# Patient Record
Sex: Female | Born: 1990
Health system: Southern US, Community
[De-identification: ages and names within clinical notes are randomized; demographics above are authoritative.]

## PROBLEM LIST (undated history)

## (undated) DIAGNOSIS — I10 Essential (primary) hypertension: Secondary | ICD-10-CM

## (undated) DIAGNOSIS — R7303 Prediabetes: Secondary | ICD-10-CM

## (undated) DIAGNOSIS — F329 Major depressive disorder, single episode, unspecified: Secondary | ICD-10-CM

## (undated) DIAGNOSIS — K589 Irritable bowel syndrome without diarrhea: Secondary | ICD-10-CM

## (undated) DIAGNOSIS — T7840XA Allergy, unspecified, initial encounter: Secondary | ICD-10-CM

## (undated) DIAGNOSIS — G43909 Migraine, unspecified, not intractable, without status migrainosus: Secondary | ICD-10-CM

## (undated) DIAGNOSIS — F988 Other specified behavioral and emotional disorders with onset usually occurring in childhood and adolescence: Secondary | ICD-10-CM

## (undated) DIAGNOSIS — F32A Depression, unspecified: Secondary | ICD-10-CM

## (undated) DIAGNOSIS — R600 Localized edema: Secondary | ICD-10-CM

## (undated) DIAGNOSIS — J45909 Unspecified asthma, uncomplicated: Secondary | ICD-10-CM

## (undated) DIAGNOSIS — F419 Anxiety disorder, unspecified: Secondary | ICD-10-CM

## (undated) DIAGNOSIS — E739 Lactose intolerance, unspecified: Secondary | ICD-10-CM

## (undated) DIAGNOSIS — E785 Hyperlipidemia, unspecified: Secondary | ICD-10-CM

## (undated) DIAGNOSIS — R011 Cardiac murmur, unspecified: Secondary | ICD-10-CM

## (undated) DIAGNOSIS — K219 Gastro-esophageal reflux disease without esophagitis: Secondary | ICD-10-CM

## (undated) DIAGNOSIS — E282 Polycystic ovarian syndrome: Secondary | ICD-10-CM

## (undated) HISTORY — DX: Anxiety disorder, unspecified: F41.9

## (undated) HISTORY — PX: ESOPHAGOGASTRODUODENOSCOPY ENDOSCOPY: SHX5814

## (undated) HISTORY — DX: Lactose intolerance, unspecified: E73.9

## (undated) HISTORY — DX: Irritable bowel syndrome, unspecified: K58.9

## (undated) HISTORY — PX: WISDOM TOOTH EXTRACTION: SHX21

## (undated) HISTORY — DX: Localized edema: R60.0

## (undated) HISTORY — DX: Cardiac murmur, unspecified: R01.1

## (undated) HISTORY — DX: Unspecified asthma, uncomplicated: J45.909

## (undated) HISTORY — DX: Hyperlipidemia, unspecified: E78.5

## (undated) HISTORY — DX: Migraine, unspecified, not intractable, without status migrainosus: G43.909

## (undated) HISTORY — DX: Depression, unspecified: F32.A

## (undated) HISTORY — DX: Gastro-esophageal reflux disease without esophagitis: K21.9

## (undated) HISTORY — DX: Prediabetes: R73.03

## (undated) HISTORY — DX: Allergy, unspecified, initial encounter: T78.40XA

## (undated) HISTORY — DX: Major depressive disorder, single episode, unspecified: F32.9

---
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
## 2011-04-19 LAB — DIFFERENTIAL
Basophils Absolute: 0
Basophils Relative: 0
Eosinophils Absolute: 0.1
Eosinophils Relative: 1

## 2011-04-19 LAB — URINALYSIS, ROUTINE W REFLEX MICROSCOPIC
Glucose, UA: NEGATIVE
Ketones, ur: NEGATIVE
Nitrite: NEGATIVE
Protein, ur: 30 — AB
Specific Gravity, Urine: 1.034 — ABNORMAL HIGH
Urobilinogen, UA: 0.2
pH: 5.5

## 2011-04-19 LAB — LIPASE, BLOOD: Lipase: 26

## 2011-04-19 LAB — CBC
Hemoglobin: 13.4
MCV: 85.9
RBC: 4.61
WBC: 11.7 — ABNORMAL HIGH

## 2011-04-19 LAB — POCT PREGNANCY, URINE: Preg Test, Ur: NEGATIVE

## 2011-04-19 LAB — COMPREHENSIVE METABOLIC PANEL
ALT: 18
AST: 23
CO2: 29
Chloride: 102
GFR calc Af Amer: >60
GFR calc non Af Amer: >60
Sodium: 140
Total Bilirubin: 0.8

## 2011-04-19 LAB — URINE MICROSCOPIC-ADD ON

## 2014-02-08 ENCOUNTER — Emergency Department (HOSPITAL_COMMUNITY)
Admission: EM | Admit: 2014-02-08 | Discharge: 2014-02-08 | Disposition: A | Discharge status: Home / Self Care | Payer: 59 | Source: Home / Self Care | Attending: Emergency Medicine | Admitting: Emergency Medicine

## 2014-02-08 ENCOUNTER — Encounter (HOSPITAL_COMMUNITY): Payer: Self-pay | Admitting: Emergency Medicine

## 2014-02-08 DIAGNOSIS — I10 Essential (primary) hypertension: Secondary | ICD-10-CM

## 2014-02-08 DIAGNOSIS — R319 Hematuria, unspecified: Secondary | ICD-10-CM

## 2014-02-08 LAB — POCT URINALYSIS DIP (DEVICE)
BILIRUBIN URINE: NEGATIVE
GLUCOSE, UA: NEGATIVE mg/dL
Leukocytes, UA: NEGATIVE
NITRITE: NEGATIVE
PH: 6.5 (ref 5.0–8.0)
Protein, ur: NEGATIVE mg/dL
SPECIFIC GRAVITY, URINE: 1.02 (ref 1.005–1.030)
Urobilinogen, UA: 0.2 mg/dL (ref 0.0–1.0)

## 2014-02-08 LAB — POCT PREGNANCY, URINE: Preg Test, Ur: NEGATIVE

## 2014-02-08 MED ORDER — CEPHALEXIN 500 MG PO CAPS: 500.0000 mg | ORAL_CAPSULE | Freq: Three times a day (TID) | ORAL | Status: DC

## 2014-02-08 NOTE — ED Notes (Signed)
C/o uti for a month States she notice blood in her urine  No tx tried.

## 2014-02-08 NOTE — ED Provider Notes (Signed)
Chief Complaint   Chief Complaint  Patient presents with  . Urinary Tract Infection    History of Present Illness   Madison Robertson is a 23 year old female who's had a one-month history of blood in her urine. This has been painless, she denies dysuria, frequency, urgency, lower abdominal or lower back pain, fever, chills, nausea, or vomiting. She's had no prior history of urinary tract infections. No GYN problems except for polycystic ovarian syndrome.  Review of Systems   Other than as noted above, the patient denies any of the following symptoms: General:  No fevers or chills. GI:  No abdominal pain, back pain, nausea, or vomiting. GU:  No hematuria or incontinence. GYN:  No discharge, itching, vulvar pain or lesions, pelvic pain, or abnormal vaginal bleeding.  [place]   Past medical history, family history, social history, meds, and allergies were reviewed.    Physical Examination     Vital signs:  BP 154/105  Pulse 90  Temp(Src) 98.3 F (36.8 C) (Oral)  Resp 18  SpO2 99% Gen:  Alert, oriented, in no distress. Lungs:  Clear to auscultation, no wheezes, rales or rhonchi. Heart:  Regular rhythm, no gallop or murmer. Abdomen:  Flat and soft. There was slight suprapubic pain to palpation.  No guarding, or rebound.  No hepato-splenomegaly or mass.  Bowel sounds were normally active.  No hernia. Back:  No CVA tenderness.  Skin:  Clear, warm and dry.  Labs   Results for orders placed during the hospital encounter of 02/08/14  POCT URINALYSIS DIP (DEVICE)      Result Value Ref Range   Glucose, UA NEGATIVE  NEGATIVE mg/dL   Bilirubin Urine NEGATIVE  NEGATIVE   Ketones, ur TRACE (*) NEGATIVE mg/dL   Specific Gravity, Urine 1.020  1.005 - 1.030   Hgb urine dipstick TRACE (*) NEGATIVE   pH 6.5  5.0 - 8.0   Protein, ur NEGATIVE  NEGATIVE mg/dL   Urobilinogen, UA 0.2  0.0 - 1.0 mg/dL   Nitrite NEGATIVE  NEGATIVE   Leukocytes, UA NEGATIVE  NEGATIVE  POCT PREGNANCY, URINE    Result Value Ref Range   Preg Test, Ur NEGATIVE  NEGATIVE     A urine culture was obtained.  Results are pending at this time and we will call about any positive results.  Assessment   The primary encounter diagnosis was Hematuria. A diagnosis of Essential hypertension was also pertinent to this visit.   No evidence of pyelonephritis.  She has painless hematuria. Her dipstick today only shows trace hemoglobin. I presented to her and her mother that there are many other causes for blood in the urine. We will be doing a culture. We'll go ahead and treat with cephalexin. I suggested she followup with a primary care physician in 2 weeks for a repeat urinalysis. If there is still blood present she will need further evaluation possibly to include a CT scan and cystoscopy.  Plan   1.  Meds:  The following meds were prescribed:   Discharge Medication List as of 02/08/2014  6:54 PM    START taking these medications   Details  cephALEXin (KEFLEX) 500 MG capsule Take 1 capsule (500 mg total) by mouth 3 (three) times daily., Starting 02/08/2014, Until Discontinued, Normal        2.  Patient Education/Counseling:  The patient was given appropriate handouts, self care instructions, and instructed in symptomatic relief. The patient was told to avoid intercourse for 10 days, get extra fluids,  and return for a follow up with her primary care doctor at the completion of treatment for a repeat UA and culture.    3.  Follow up:  The patient was told to follow up here if no better in 3 to 4 days, or sooner if becoming worse in any way, and given some red flag symptoms such as fever, persistent vomiting, or severe flank or abdominal pain which would prompt immediate return.     Harden Mo, MD 02/08/14 [phone]

## 2014-02-08 NOTE — Discharge Instructions (Signed)
Hematuria, Adult  Hematuria is blood in your urine. It can be caused by a bladder infection, kidney infection, prostate infection, kidney stone, or cancer of your urinary tract. Infections can usually be treated with medicine, and a kidney stone usually will pass through your urine. If neither of these is the cause of your hematuria, further workup to find out the reason may be needed.  It is very important that you tell your health care provider about any blood you see in your urine, even if the blood stops without treatment or happens without causing pain. Blood in your urine that happens and then stops and then happens again can be a symptom of a very serious condition. Also, pain is not a symptom in the initial stages of many urinary cancers.  HOME CARE INSTRUCTIONS   · Drink lots of fluid, 3-4 quarts a day. If you have been diagnosed with an infection, cranberry juice is especially recommended, in addition to large amounts of water.  · Avoid caffeine, tea, and carbonated beverages, because they tend to irritate the bladder.  · Avoid alcohol because it may irritate the prostate.  · Only take over-the-counter or prescription medicines for pain, discomfort, or fever as directed by your health care provider.  · If you have been diagnosed with a kidney stone, follow your health care provider's instructions regarding straining your urine to catch the stone.  · Empty your bladder often. Avoid holding urine for long periods of time.  · After a bowel movement, women should cleanse front to back. Use each tissue only once.  · Empty your bladder before and after sexual intercourse if you are a female.  SEEK MEDICAL CARE IF:  You develop back pain, fever, a feeling of sickness in your stomach (nausea), or vomiting or if your symptoms are not better in 3 days. Return sooner if you are getting worse.  SEEK IMMEDIATE MEDICAL CARE IF:   · You have a persistent fever, with a temperature of 101.8°F (38.8°C) or greater.  · You  develop severe vomiting and are unable to keep the medicine down.  · You develop severe back or abdominal pain despite taking your medicines.  · You begin passing a large amount of blood or clots in your urine.  · You feel extremely weak or faint, or you pass out.  MAKE SURE YOU:   · Understand these instructions.  · Will watch your condition.  · Will get help right away if you are not doing well or get worse.  Document Released: 07/15/2005 Document Revised: 05/05/2013 Document Reviewed: 03/15/2013  Exit[employer]® Patient Information ©2015 [employer]. This information is not intended to replace advice given to you by your health care provider. Make sure you discuss any questions you have with your health care provider.

## 2014-05-19 ENCOUNTER — Ambulatory Visit (INDEPENDENT_AMBULATORY_CARE_PROVIDER_SITE_OTHER): Payer: 59 | Admitting: Emergency Medicine

## 2014-05-19 VITALS — BP 124/84 | HR 138 | Temp 99.7°F | Resp 16 | Ht 67.0 in | Wt 257.2 lb

## 2014-05-19 DIAGNOSIS — J020 Streptococcal pharyngitis: Secondary | ICD-10-CM

## 2014-05-19 MED ORDER — PENICILLIN V POTASSIUM 500 MG PO TABS: 500.0000 mg | ORAL_TABLET | Freq: Four times a day (QID) | ORAL | Status: DC

## 2014-05-19 NOTE — Progress Notes (Signed)
[facility] [address] [place] [address] [phone]  Date:  05/19/2014   Name:  Madison Robertson   DOB:  06/01/1991   MRN:  [id number]  PCP:  No PCP Per Patient    Chief Complaint: Sore Throat and Fever   History of Present Illness:  Madison Robertson is a 23 y.o. very pleasant female patient who presents with the following:  Sore throat this morning at 0430 when awakened with pain.   101.4 fever this afternoon.  Some chills.   No cough, wheezing or shortness of breath.  Clear rhinorrhea.  No PND No rash. No nausea or vomiting No improvement with over the counter medications or other home remedies.  Denies other complaint or health concern today.   There are no active problems to display for this patient.   No past medical history on file.  No past surgical history on file.  History  Substance Use Topics  . Smoking status: Never Smoker   . Smokeless tobacco: Not on file  . Alcohol Use: Not on file    No family history on file.  No Known Allergies  Medication list has been reviewed and updated.  No current outpatient prescriptions on file prior to visit.   No current facility-administered medications on file prior to visit.    Review of Systems:  As per HPI, otherwise negative.    Physical Examination: Filed Vitals:   05/19/14 1655  BP: 124/84  Pulse: 138  Temp: 99.7 F (37.6 C)  Resp: 16   Filed Vitals:   05/19/14 1655  Height: 5\' 7"  (1.702 m)  Weight: 257 lb 3.2 oz (116.665 kg)   Body mass index is 40.27 kg/(m^2). Ideal Body Weight: Weight in (lb) to have BMI = 25: 159.3  GEN: WDWN, NAD, Non-toxic, A & O x 3 HEENT: Atraumatic, Normocephalic. Neck supple. No masses, No LAD.  Exudative pharyngitis Ears and Nose: No external deformity. CV: RRR, No M/G/R. No JVD. No thrill. No extra heart sounds. PULM: CTA B, no wheezes, crackles, rhonchi. No retractions. No resp. distress. No accessory muscle use. ABD: S, NT, ND,  +BS. No rebound. No HSM. EXTR: No c/c/e NEURO Normal gait.  PSYCH: Normally interactive. Conversant. Not depressed or anxious appearing.  Calm demeanor.    Assessment and Plan: Strep throat Pen v k Tylenol #3  Signed,  Ellison Carwin, MD

## 2014-05-19 NOTE — Patient Instructions (Signed)

## 2014-05-20 ENCOUNTER — Other Ambulatory Visit (HOSPITAL_COMMUNITY)
Admission: RE | Admit: 2014-05-20 | Discharge: 2014-05-20 | Disposition: A | Discharge status: Home / Self Care | Payer: 59 | Source: Ambulatory Visit | Attending: Obstetrics and Gynecology | Admitting: Obstetrics and Gynecology

## 2014-05-20 ENCOUNTER — Other Ambulatory Visit: Payer: Self-pay | Admitting: Obstetrics and Gynecology

## 2014-05-20 DIAGNOSIS — Z01411 Encounter for gynecological examination (general) (routine) with abnormal findings: Secondary | ICD-10-CM | POA: Diagnosis not present

## 2014-05-24 LAB — CYTOLOGY - PAP

## 2014-12-27 ENCOUNTER — Other Ambulatory Visit: Payer: Self-pay | Admitting: Family Medicine

## 2014-12-27 DIAGNOSIS — E010 Iodine-deficiency related diffuse (endemic) goiter: Secondary | ICD-10-CM

## 2014-12-29 ENCOUNTER — Ambulatory Visit
Admission: RE | Admit: 2014-12-29 | Discharge: 2014-12-29 | Disposition: A | Discharge status: Home / Self Care | Payer: 59 | Source: Ambulatory Visit | Attending: Family Medicine | Admitting: Family Medicine

## 2014-12-29 DIAGNOSIS — E010 Iodine-deficiency related diffuse (endemic) goiter: Secondary | ICD-10-CM

## 2015-08-01 MED FILL — METHYLPHENIDATE ER 27 MG TA: 27 | 30 days supply | Qty: 30 | Fill #0

## 2015-08-18 DIAGNOSIS — I10 Essential (primary) hypertension: Secondary | ICD-10-CM | POA: Diagnosis not present

## 2015-08-18 DIAGNOSIS — E282 Polycystic ovarian syndrome: Secondary | ICD-10-CM | POA: Diagnosis not present

## 2015-08-18 DIAGNOSIS — F900 Attention-deficit hyperactivity disorder, predominantly inattentive type: Secondary | ICD-10-CM | POA: Diagnosis not present

## 2015-08-18 MED FILL — MONO-LINYAH 28 TABLET: 0.25-35 | 28 days supply | Qty: 28 | Fill #0

## 2015-08-18 MED FILL — TRIAMCINOLONE 0.1% CREAM: 0.1 | 30 days supply | Qty: 80 | Fill #2

## 2015-08-18 MED FILL — METHYLPHENIDATE 10 MG TAB: 10 | 30 days supply | Qty: 30 | Fill #0

## 2015-09-05 MED FILL — METHYLPHENIDATE ER 27 MG TA: 27 | 30 days supply | Qty: 30 | Fill #0

## 2015-09-06 DIAGNOSIS — L680 Hirsutism: Secondary | ICD-10-CM | POA: Diagnosis not present

## 2015-09-06 DIAGNOSIS — E282 Polycystic ovarian syndrome: Secondary | ICD-10-CM | POA: Diagnosis not present

## 2015-09-13 MED FILL — MONO-LINYAH 28 TABLET: 0.25-35 | 84 days supply | Qty: 84 | Fill #0

## 2015-09-19 DIAGNOSIS — I10 Essential (primary) hypertension: Secondary | ICD-10-CM | POA: Diagnosis not present

## 2015-09-19 DIAGNOSIS — E282 Polycystic ovarian syndrome: Secondary | ICD-10-CM | POA: Diagnosis not present

## 2015-09-19 DIAGNOSIS — F900 Attention-deficit hyperactivity disorder, predominantly inattentive type: Secondary | ICD-10-CM | POA: Diagnosis not present

## 2015-10-02 MED FILL — HYDROCHLOROTHIAZIDE 25 MG T: 25 | 90 days supply | Qty: 90 | Fill #3

## 2015-10-09 MED FILL — METHYLPHENIDATE 10 MG TAB: 10 | 30 days supply | Qty: 30 | Fill #0

## 2015-10-09 MED FILL — METHYLPHENIDATE ER 27 MG TA: 27 | 30 days supply | Qty: 30 | Fill #0

## 2015-11-07 MED FILL — METHYLPHENIDATE 10 MG TAB: 10 | 30 days supply | Qty: 30 | Fill #0

## 2015-11-07 MED FILL — METHYLPHENIDATE ER 27 MG TA: 27 | 30 days supply | Qty: 30 | Fill #0

## 2015-12-13 MED FILL — METHYLPHENIDATE ER 27 MG TA: 27 | 30 days supply | Qty: 30 | Fill #0

## 2015-12-18 MED FILL — MONO-LINYAH 28 TABLET: 0.25-35 | 84 days supply | Qty: 84 | Fill #1

## 2015-12-18 MED FILL — TRIAMCINOLONE 0.1% CREAM: 0.1 | 30 days supply | Qty: 80 | Fill #3

## 2016-01-05 MED FILL — HYDROCHLOROTHIAZIDE 25 MG T: 25 | 30 days supply | Qty: 30 | Fill #0

## 2016-01-24 DIAGNOSIS — F900 Attention-deficit hyperactivity disorder, predominantly inattentive type: Secondary | ICD-10-CM | POA: Diagnosis not present

## 2016-01-24 DIAGNOSIS — R739 Hyperglycemia, unspecified: Secondary | ICD-10-CM | POA: Diagnosis not present

## 2016-01-24 DIAGNOSIS — R5383 Other fatigue: Secondary | ICD-10-CM | POA: Diagnosis not present

## 2016-01-24 DIAGNOSIS — I10 Essential (primary) hypertension: Secondary | ICD-10-CM | POA: Diagnosis not present

## 2016-01-24 DIAGNOSIS — F321 Major depressive disorder, single episode, moderate: Secondary | ICD-10-CM | POA: Diagnosis not present

## 2016-01-24 DIAGNOSIS — L309 Dermatitis, unspecified: Secondary | ICD-10-CM | POA: Diagnosis not present

## 2016-01-24 MED FILL — TRIAMCINOLONE 0.1% CREAM: 0.1 | 60 days supply | Qty: 60 | Fill #0

## 2016-01-24 MED FILL — METHYLPHENIDATE 10 MG TAB: 10 | 30 days supply | Qty: 30 | Fill #0

## 2016-01-24 MED FILL — METHYLPHENIDATE ER 27 MG TA: 27 | 30 days supply | Qty: 30 | Fill #0

## 2016-01-24 MED FILL — LISINOPRIL-HCTZ 20-25 MG TA: 20-25 | 90 days supply | Qty: 90 | Fill #0

## 2016-01-24 MED FILL — SERTRALINE HCL 50 MG TABLET: 50 | 30 days supply | Qty: 30 | Fill #0

## 2016-02-01 DIAGNOSIS — R7301 Impaired fasting glucose: Secondary | ICD-10-CM | POA: Diagnosis not present

## 2016-02-01 DIAGNOSIS — R5383 Other fatigue: Secondary | ICD-10-CM | POA: Diagnosis not present

## 2016-02-01 DIAGNOSIS — I10 Essential (primary) hypertension: Secondary | ICD-10-CM | POA: Diagnosis not present

## 2016-02-05 ENCOUNTER — Encounter (HOSPITAL_COMMUNITY): Payer: Self-pay | Admitting: Emergency Medicine

## 2016-02-05 ENCOUNTER — Emergency Department (HOSPITAL_COMMUNITY)
Admission: EM | Admit: 2016-02-05 | Discharge: 2016-02-05 | Disposition: A | Discharge status: Home / Self Care | Payer: 59 | Attending: Emergency Medicine | Admitting: Emergency Medicine

## 2016-02-05 ENCOUNTER — Emergency Department (HOSPITAL_COMMUNITY): Payer: 59

## 2016-02-05 DIAGNOSIS — N390 Urinary tract infection, site not specified: Secondary | ICD-10-CM | POA: Diagnosis not present

## 2016-02-05 DIAGNOSIS — I10 Essential (primary) hypertension: Secondary | ICD-10-CM | POA: Insufficient documentation

## 2016-02-05 DIAGNOSIS — R1011 Right upper quadrant pain: Secondary | ICD-10-CM | POA: Diagnosis not present

## 2016-02-05 DIAGNOSIS — Z79899 Other long term (current) drug therapy: Secondary | ICD-10-CM | POA: Diagnosis not present

## 2016-02-05 HISTORY — DX: Other specified behavioral and emotional disorders with onset usually occurring in childhood and adolescence: F98.8

## 2016-02-05 HISTORY — DX: Essential (primary) hypertension: I10

## 2016-02-05 LAB — COMPREHENSIVE METABOLIC PANEL
ALT: 12 U/L — AB (ref 14–54)
ANION GAP: 8 (ref 5–15)
AST: 15 U/L (ref 15–41)
Albumin: 4.3 g/dL (ref 3.5–5.0)
Alkaline Phosphatase: 61 U/L (ref 38–126)
BUN: 9 mg/dL (ref 6–20)
CHLORIDE: 103 mmol/L (ref 101–111)
CO2: 27 mmol/L (ref 22–32)
Calcium: 9.2 mg/dL (ref 8.9–10.3)
Creatinine, Ser: 0.8 mg/dL (ref 0.44–1.00)
Glucose, Bld: 90 mg/dL (ref 65–99)
POTASSIUM: 3.2 mmol/L — AB (ref 3.5–5.1)
SODIUM: 138 mmol/L (ref 135–145)
Total Bilirubin: 0.5 mg/dL (ref 0.3–1.2)
Total Protein: 8.4 g/dL — ABNORMAL HIGH (ref 6.5–8.1)

## 2016-02-05 LAB — URINE MICROSCOPIC-ADD ON

## 2016-02-05 LAB — URINALYSIS, ROUTINE W REFLEX MICROSCOPIC
Bilirubin Urine: NEGATIVE
GLUCOSE, UA: NEGATIVE mg/dL
Ketones, ur: 15 mg/dL — AB
Nitrite: NEGATIVE
PROTEIN: NEGATIVE mg/dL
SPECIFIC GRAVITY, URINE: 1.023 (ref 1.005–1.030)
pH: 6.5 (ref 5.0–8.0)

## 2016-02-05 LAB — CBC
HEMATOCRIT: 42.5 % (ref 36.0–46.0)
HEMOGLOBIN: 14.1 g/dL (ref 12.0–15.0)
MCH: 27.9 pg (ref 26.0–34.0)
MCHC: 33.2 g/dL (ref 30.0–36.0)
MCV: 84 fL (ref 78.0–100.0)
Platelets: 252 10*3/uL (ref 150–400)
RBC: 5.06 MIL/uL (ref 3.87–5.11)
RDW: 14.7 % (ref 11.5–15.5)
WBC: 15.2 10*3/uL — AB (ref 4.0–10.5)

## 2016-02-05 LAB — POC URINE PREG, ED: Preg Test, Ur: NEGATIVE

## 2016-02-05 LAB — LIPASE, BLOOD: LIPASE: 32 U/L (ref 11–51)

## 2016-02-05 MED ORDER — CIPROFLOXACIN HCL 500 MG PO TABS: 500.0000 mg | ORAL_TABLET | Freq: Two times a day (BID) | ORAL | Status: DC

## 2016-02-05 MED ORDER — CIPROFLOXACIN HCL 500 MG PO TABS
500.0000 mg | ORAL_TABLET | Freq: Once | ORAL | Status: AC
  Administered 2016-02-05: 500 mg via ORAL
  Filled 2016-02-05: qty 1

## 2016-02-05 MED ORDER — OXYCODONE-ACETAMINOPHEN 5-325 MG PO TABS: 1.0000 | ORAL_TABLET | Freq: Four times a day (QID) | ORAL | Status: DC | PRN

## 2016-02-05 MED ORDER — ONDANSETRON HCL 4 MG PO TABS: 4.0000 mg | ORAL_TABLET | Freq: Four times a day (QID) | ORAL | Status: DC

## 2016-02-05 MED ORDER — OXYCODONE-ACETAMINOPHEN 5-325 MG PO TABS
1.0000 | ORAL_TABLET | Freq: Once | ORAL | Status: AC
  Administered 2016-02-05: 1 via ORAL
  Filled 2016-02-05: qty 1

## 2016-02-05 NOTE — ED Notes (Signed)
Ultrasound at bedside

## 2016-02-05 NOTE — Discharge Instructions (Signed)

## 2016-02-05 NOTE — ED Notes (Signed)
Pt reports intermittent RUQ abd pain for the past 1.5 weeks. Has been occuring more often in the past 24 hours. Accompanied by nausea. Urine has appeared darker than usual today. No diarrhea or vaginal discharge.

## 2016-02-05 NOTE — ED Provider Notes (Signed)
CSN: [id number]     Arrival date & time 02/05/16  1816 History  By signing my name below, I, Evelene Croon, attest that this documentation has been prepared under the direction and in the presence of non-physician practitioner, Linus Mako, PA-C. Electronically Signed: Evelene Croon, Scribe. 02/05/2016. 11:14 PM.    Chief Complaint  Patient presents with  . Abdominal Pain    The history is provided by the patient. No language interpreter was used.     HPI Comments:  Madison Robertson is a 25 y.o. female who presents to the Emergency Department complaining of intermittent, RUQ abdominal pain x 1.5 weeks. Pt rates her pain a 6/10 at this time. She reports associated nausea. She denies fever, dysuria, urinary frequency/urgency, and abnormal vaginal discharge or bleeding. Pt states she noticed a tinge of blood when she wiped today. She also notes her urine seemed darker today. No alleviating factors noted.   Past Medical History  Diagnosis Date  . Hypertension   . ADD (attention deficit disorder)    History reviewed. No pertinent past surgical history. History reviewed. No pertinent family history. Social History  Substance Use Topics  . Smoking status: Never Smoker   . Smokeless tobacco: None  . Alcohol Use: None   OB History    No data available     Review of Systems  Constitutional: Negative for fever.  Gastrointestinal: Positive for nausea and abdominal pain. Negative for vomiting.  Genitourinary: Negative for dysuria, urgency, frequency and vaginal discharge.  All other systems reviewed and are negative.  Allergies  Hydrocodone and Zoloft  Home Medications   Prior to Admission medications   Medication Sig Start Date End Date Taking? Authorizing Provider  ibuprofen (ADVIL,MOTRIN) 200 MG tablet Take 600 mg by mouth every 6 (six) hours as needed for fever, headache, mild pain, moderate pain or cramping.   Yes Historical Provider, MD  lisinopril-hydrochlorothiazide  (PRINZIDE,ZESTORETIC) 20-25 MG tablet Take 1 tablet by mouth daily.   Yes Historical Provider, MD  methylphenidate (RITALIN) 10 MG tablet Take 10 mg by mouth daily.   Yes Historical Provider, MD  methylphenidate 27 MG PO CR tablet Take 27 mg by mouth daily with breakfast.   Yes Historical Provider, MD  norgestimate-ethinyl estradiol ([place] 28) 0.25-35 MG-MCG tablet Take 1 tablet by mouth daily.   Yes Historical Provider, MD  ciprofloxacin (CIPRO) 500 MG tablet Take 1 tablet (500 mg total) by mouth 2 (two) times daily. 02/05/16   Domnic Vantol Carlota Raspberry, PA-C  ondansetron (ZOFRAN) 4 MG tablet Take 1 tablet (4 mg total) by mouth every 6 (six) hours. 02/05/16   Delos Haring, PA-C  oxyCODONE-acetaminophen (PERCOCET/ROXICET) 5-325 MG tablet Take 1 tablet by mouth every 6 (six) hours as needed for severe pain. 02/05/16   Gladyse Corvin Carlota Raspberry, PA-C   BP 114/75 mmHg  Pulse 94  Temp(Src) 98.4 F (36.9 C) (Oral)  Resp 20  SpO2 98% Physical Exam  Constitutional: She is oriented to person, place, and time. She appears well-developed and well-nourished. No distress.  HENT:  Head: Normocephalic and atraumatic.  Eyes: Conjunctivae are normal.  Cardiovascular: Normal rate.   Pulmonary/Chest: Effort normal.  Abdominal: Bowel sounds are normal. She exhibits no distension. There is no tenderness. There is no rigidity, no rebound, no guarding and no CVA tenderness.  Neurological: She is alert and oriented to person, place, and time.  Skin: Skin is warm and dry.  Psychiatric: She has a normal mood and affect.  Nursing note and vitals reviewed.  ED Course  Procedures  DIAGNOSTIC STUDIES:  Oxygen Saturation is 97% on RA, normal by my interpretation.    COORDINATION OF CARE:  11:09 PM Pt updated with results. Will administer antibiotic and percocet in the ED and discharge with Rx for both.   Discussed treatment plan with pt at bedside and pt agreed to plan.  Labs Review Labs Reviewed  COMPREHENSIVE METABOLIC  PANEL - Abnormal; Notable for the following:    Potassium 3.2 (*)    Total Protein 8.4 (*)    ALT 12 (*)    All other components within normal limits  CBC - Abnormal; Notable for the following:    WBC 15.2 (*)    All other components within normal limits  URINALYSIS, ROUTINE W REFLEX MICROSCOPIC (NOT AT [facility]) - Abnormal; Notable for the following:    APPearance CLOUDY (*)    Hgb urine dipstick LARGE (*)    Ketones, ur 15 (*)    Leukocytes, UA MODERATE (*)    All other components within normal limits  URINE MICROSCOPIC-ADD ON - Abnormal; Notable for the following:    Squamous Epithelial / LPF 6-30 (*)    Bacteria, UA FEW (*)    All other components within normal limits  URINE CULTURE  LIPASE, BLOOD  POC URINE PREG, ED    Imaging Review US Abdomen Limited  02/05/2016  CLINICAL DATA:  Intermittent right upper quadrant pain for 1.5 weeks. Nausea and vomiting. EXAM: US ABDOMEN LIMITED - RIGHT UPPER QUADRANT COMPARISON:  CT abdomen and pelvis 08/15/2007 FINDINGS: Gallbladder: No gallstones or wall thickening visualized. No sonographic Murphy sign noted by sonographer. Common bile duct: Diameter: 2.5 mm, normal Liver: No focal lesion identified. Within normal limits in parenchymal echogenicity. IMPRESSION: No evidence of cholelithiasis or cholecystitis. Electronically Signed   By: Lucienne Capers M.D.   On: 02/05/2016 22:29   I have personally reviewed and evaluated these images and lab results as part of my medical decision-making.   EKG Interpretation None      MDM   Final diagnoses:  UTI (lower urinary tract infection)   The patients US of the RUQ is unremarkable which is reassuring. Her CBC and BMP do not show any acute findings that require intervention. Her Urinalysis shows moderate leukocytes. Discussed possibly doing a pelvic with patient and she declines, she says she is not having any issues in the vaginal area.    Pt diagnosed with a UTI. Pt is afebrile, tachycardia,  hypotension, or other signs of serious infection.  Pt to be dc home with antibiotics and instructions to follow up with PCP if symptoms persist. Discussed return precautions. Pt appears safe for discharge.  Rx: Ciprofloxacin, pain and nausea medication. Follow-up with PCP in the next 1-3 days.  I personally performed the services described in this documentation, which was scribed in my presence. The recorded information has been reviewed and is accurate.   Delos Haring, PA-C 02/05/16 [place], MD 02/07/16 [phone]

## 2016-02-06 MED FILL — ONDANSETRON HCL 4 MG TABLET: 4 | 3 days supply | Qty: 12 | Fill #0

## 2016-02-06 MED FILL — CIPROFLOXACIN HCL 500 MG TA: 500 | 14 days supply | Qty: 28 | Fill #0

## 2016-02-06 MED FILL — OXYCODONE/APAP 5-325: 5-325 | 3 days supply | Qty: 10 | Fill #0

## 2016-02-07 LAB — URINE CULTURE: SPECIAL REQUESTS: NORMAL

## 2016-02-21 DIAGNOSIS — R1032 Left lower quadrant pain: Secondary | ICD-10-CM | POA: Diagnosis not present

## 2016-02-21 DIAGNOSIS — N3000 Acute cystitis without hematuria: Secondary | ICD-10-CM | POA: Diagnosis not present

## 2016-02-21 DIAGNOSIS — F321 Major depressive disorder, single episode, moderate: Secondary | ICD-10-CM | POA: Diagnosis not present

## 2016-02-21 DIAGNOSIS — I10 Essential (primary) hypertension: Secondary | ICD-10-CM | POA: Diagnosis not present

## 2016-02-23 ENCOUNTER — Other Ambulatory Visit: Payer: Self-pay | Admitting: Family Medicine

## 2016-02-23 DIAGNOSIS — R109 Unspecified abdominal pain: Secondary | ICD-10-CM

## 2016-02-27 MED FILL — METHYLPHENIDATE ER 27 MG TA: 27 | 30 days supply | Qty: 30 | Fill #0

## 2016-02-27 MED FILL — ONDANSETRON HCL 4 MG TABLET: 4 | 30 days supply | Qty: 20 | Fill #0

## 2016-02-27 MED FILL — METHYLPHENIDATE 10 MG TAB: 10 | 30 days supply | Qty: 30 | Fill #0

## 2016-02-29 ENCOUNTER — Ambulatory Visit
Admission: RE | Admit: 2016-02-29 | Discharge: 2016-02-29 | Disposition: A | Discharge status: Home / Self Care | Payer: 59 | Source: Ambulatory Visit | Attending: Family Medicine | Admitting: Family Medicine

## 2016-02-29 DIAGNOSIS — R109 Unspecified abdominal pain: Secondary | ICD-10-CM

## 2016-02-29 DIAGNOSIS — R1032 Left lower quadrant pain: Secondary | ICD-10-CM | POA: Diagnosis not present

## 2016-03-13 MED FILL — MONO-LINYAH 28 TABLET: 0.25-35 | 84 days supply | Qty: 84 | Fill #2

## 2016-04-02 MED FILL — SERTRALINE HCL 50 MG TABLET: 50 | 30 days supply | Qty: 30 | Fill #1

## 2016-04-03 MED FILL — METHYLPHENIDATE ER 27 MG TA: 27 | 30 days supply | Qty: 30 | Fill #0

## 2016-04-26 MED FILL — LISINOPRIL-HCTZ 20-25 MG TA: 20-25 | 90 days supply | Qty: 90 | Fill #1

## 2016-05-01 DIAGNOSIS — F900 Attention-deficit hyperactivity disorder, predominantly inattentive type: Secondary | ICD-10-CM | POA: Diagnosis not present

## 2016-05-01 DIAGNOSIS — N83202 Unspecified ovarian cyst, left side: Secondary | ICD-10-CM | POA: Diagnosis not present

## 2016-05-01 DIAGNOSIS — R399 Unspecified symptoms and signs involving the genitourinary system: Secondary | ICD-10-CM | POA: Diagnosis not present

## 2016-05-01 DIAGNOSIS — R112 Nausea with vomiting, unspecified: Secondary | ICD-10-CM | POA: Diagnosis not present

## 2016-05-01 MED FILL — METHYLPHENIDATE 10 MG TAB: 10 | 30 days supply | Qty: 30 | Fill #0

## 2016-05-01 MED FILL — CIPROFLOXACIN HCL 500 MG TA: 500 | 5 days supply | Qty: 10 | Fill #0

## 2016-05-01 MED FILL — METHYLPHENIDATE ER 27 MG TA: 27 | 30 days supply | Qty: 30 | Fill #0

## 2016-05-01 MED FILL — OMEPRAZOLE DR 40 MG CAPSULE: 40 | 30 days supply | Qty: 30 | Fill #0

## 2016-05-02 ENCOUNTER — Emergency Department (HOSPITAL_COMMUNITY)
Admission: EM | Admit: 2016-05-02 | Discharge: 2016-05-02 | Disposition: A | Discharge status: Home / Self Care | Payer: 59 | Attending: Emergency Medicine | Admitting: Emergency Medicine

## 2016-05-02 ENCOUNTER — Encounter (HOSPITAL_COMMUNITY): Payer: Self-pay | Admitting: Emergency Medicine

## 2016-05-02 ENCOUNTER — Emergency Department (HOSPITAL_COMMUNITY): Payer: 59

## 2016-05-02 DIAGNOSIS — R109 Unspecified abdominal pain: Secondary | ICD-10-CM

## 2016-05-02 DIAGNOSIS — Z79899 Other long term (current) drug therapy: Secondary | ICD-10-CM | POA: Diagnosis not present

## 2016-05-02 DIAGNOSIS — K529 Noninfective gastroenteritis and colitis, unspecified: Secondary | ICD-10-CM | POA: Diagnosis not present

## 2016-05-02 DIAGNOSIS — R1011 Right upper quadrant pain: Secondary | ICD-10-CM | POA: Diagnosis not present

## 2016-05-02 DIAGNOSIS — R1013 Epigastric pain: Secondary | ICD-10-CM | POA: Diagnosis not present

## 2016-05-02 DIAGNOSIS — I10 Essential (primary) hypertension: Secondary | ICD-10-CM | POA: Diagnosis not present

## 2016-05-02 DIAGNOSIS — F909 Attention-deficit hyperactivity disorder, unspecified type: Secondary | ICD-10-CM | POA: Diagnosis not present

## 2016-05-02 HISTORY — DX: Polycystic ovarian syndrome: E28.2

## 2016-05-02 LAB — URINALYSIS, ROUTINE W REFLEX MICROSCOPIC
BILIRUBIN URINE: NEGATIVE
GLUCOSE, UA: NEGATIVE mg/dL
HGB URINE DIPSTICK: NEGATIVE
KETONES UR: 15 mg/dL — AB
Nitrite: NEGATIVE
PH: 6.5 (ref 5.0–8.0)
Protein, ur: NEGATIVE mg/dL
Specific Gravity, Urine: 1.018 (ref 1.005–1.030)

## 2016-05-02 LAB — CBC WITH DIFFERENTIAL/PLATELET
Basophils Absolute: 0 10*3/uL (ref 0.0–0.1)
Basophils Relative: 0 %
EOS ABS: 0.1 10*3/uL (ref 0.0–0.7)
Eosinophils Relative: 1 %
HCT: 46.3 % — ABNORMAL HIGH (ref 36.0–46.0)
HEMOGLOBIN: 15.3 g/dL — AB (ref 12.0–15.0)
LYMPHS ABS: 2.4 10*3/uL (ref 0.7–4.0)
LYMPHS PCT: 16 %
MCH: 27.8 pg (ref 26.0–34.0)
MCHC: 33 g/dL (ref 30.0–36.0)
MCV: 84 fL (ref 78.0–100.0)
Monocytes Absolute: 0.6 10*3/uL (ref 0.1–1.0)
Monocytes Relative: 4 %
NEUTROS PCT: 79 %
Neutro Abs: 11.4 10*3/uL — ABNORMAL HIGH (ref 1.7–7.7)
Platelets: 266 10*3/uL (ref 150–400)
RBC: 5.51 MIL/uL — AB (ref 3.87–5.11)
RDW: 14.8 % (ref 11.5–15.5)
WBC: 14.6 10*3/uL — AB (ref 4.0–10.5)

## 2016-05-02 LAB — I-STAT CG4 LACTIC ACID, ED
LACTIC ACID, VENOUS: 0.79 mmol/L (ref 0.5–1.9)
Lactic Acid, Venous: 0.76 mmol/L (ref 0.5–1.9)

## 2016-05-02 LAB — LIPASE, BLOOD: LIPASE: 31 U/L (ref 11–51)

## 2016-05-02 LAB — COMPREHENSIVE METABOLIC PANEL
ALK PHOS: 74 U/L (ref 38–126)
ALT: 22 U/L (ref 14–54)
AST: 20 U/L (ref 15–41)
Albumin: 4.6 g/dL (ref 3.5–5.0)
Anion gap: 9 (ref 5–15)
BUN: 7 mg/dL (ref 6–20)
CALCIUM: 9.7 mg/dL (ref 8.9–10.3)
CO2: 29 mmol/L (ref 22–32)
CREATININE: 0.85 mg/dL (ref 0.44–1.00)
Chloride: 99 mmol/L — ABNORMAL LOW (ref 101–111)
GFR calc non Af Amer: >60 mL/min (ref >60)
GLUCOSE: 93 mg/dL (ref 65–99)
Potassium: 3.6 mmol/L (ref 3.5–5.1)
SODIUM: 137 mmol/L (ref 135–145)
Total Bilirubin: 0.6 mg/dL (ref 0.3–1.2)
Total Protein: 9.5 g/dL — ABNORMAL HIGH (ref 6.5–8.1)

## 2016-05-02 LAB — URINE MICROSCOPIC-ADD ON

## 2016-05-02 LAB — D-DIMER, QUANTITATIVE: D-Dimer, Quant: 10.27 ug/mL-FEU — ABNORMAL HIGH (ref 0.00–0.50)

## 2016-05-02 LAB — POC URINE PREG, ED: Preg Test, Ur: NEGATIVE

## 2016-05-02 MED ORDER — MORPHINE SULFATE (PF) 4 MG/ML IV SOLN
4.0000 mg | Freq: Once | INTRAVENOUS | Status: AC
  Administered 2016-05-02: 4 mg via INTRAVENOUS
  Filled 2016-05-02: qty 1

## 2016-05-02 MED ORDER — IOPAMIDOL (ISOVUE-300) INJECTION 61%
15.0000 mL | Freq: Once | INTRAVENOUS | Status: AC | PRN
  Administered 2016-05-02: 15 mL via ORAL

## 2016-05-02 MED ORDER — METRONIDAZOLE 500 MG PO TABS: 500.0000 mg | ORAL_TABLET | Freq: Three times a day (TID) | ORAL | 0 refills | Status: AC

## 2016-05-02 MED ORDER — OXYCODONE-ACETAMINOPHEN 5-325 MG PO TABS
1.0000 | ORAL_TABLET | Freq: Once | ORAL | Status: AC
  Administered 2016-05-02: 1 via ORAL
  Filled 2016-05-02: qty 1

## 2016-05-02 MED ORDER — ONDANSETRON 4 MG PO TBDP
4.0000 mg | ORAL_TABLET | Freq: Once | ORAL | Status: AC
  Administered 2016-05-02: 4 mg via ORAL
  Filled 2016-05-02: qty 1

## 2016-05-02 MED ORDER — SODIUM CHLORIDE 0.9 % IV BOLUS (SEPSIS)
1000.0000 mL | Freq: Once | INTRAVENOUS | Status: AC
  Administered 2016-05-02: 1000 mL via INTRAVENOUS

## 2016-05-02 MED ORDER — ONDANSETRON 4 MG PO TBDP: 4.0000 mg | ORAL_TABLET | Freq: Three times a day (TID) | ORAL | 0 refills | Status: DC | PRN

## 2016-05-02 MED ORDER — CIPROFLOXACIN HCL 500 MG PO TABS
500.0000 mg | ORAL_TABLET | Freq: Once | ORAL | Status: AC
  Administered 2016-05-02: 500 mg via ORAL
  Filled 2016-05-02: qty 1

## 2016-05-02 MED ORDER — ONDANSETRON HCL 4 MG/2ML IJ SOLN
4.0000 mg | Freq: Once | INTRAMUSCULAR | Status: AC
  Administered 2016-05-02: 4 mg via INTRAVENOUS
  Filled 2016-05-02: qty 2

## 2016-05-02 MED ORDER — IOPAMIDOL (ISOVUE-370) INJECTION 76%
100.0000 mL | Freq: Once | INTRAVENOUS | Status: AC | PRN
  Administered 2016-05-02: 100 mL via INTRAVENOUS

## 2016-05-02 MED ORDER — METRONIDAZOLE 500 MG PO TABS
500.0000 mg | ORAL_TABLET | Freq: Once | ORAL | Status: AC
  Administered 2016-05-02: 500 mg via ORAL
  Filled 2016-05-02: qty 1

## 2016-05-02 MED ORDER — CIPROFLOXACIN HCL 500 MG PO TABS: 500.0000 mg | ORAL_TABLET | Freq: Two times a day (BID) | ORAL | 0 refills | Status: AC

## 2016-05-02 MED ORDER — OXYCODONE-ACETAMINOPHEN 5-325 MG PO TABS: 1.0000 | ORAL_TABLET | ORAL | 0 refills | Status: DC | PRN

## 2016-05-02 MED FILL — OXYCODONE/APAP 5/325 MG TAB: 5-325 | 6 days supply | Qty: 15 | Fill #0

## 2016-05-02 MED FILL — metroNIDAZOLE 500 MG TABS: 500 | 7 days supply | Qty: 21 | Fill #0

## 2016-05-02 MED FILL — ONDANSETRON ODT 4 MG TABLET: 4 | 7 days supply | Qty: 20 | Fill #0

## 2016-05-02 NOTE — ED Provider Notes (Signed)
[facility] DEPT Provider Note   CSN: [id number] Arrival date & time: 05/02/16  0650     History   Chief Complaint Chief Complaint  Patient presents with  . Abdominal Pain    HPI Madison Robertson is a 25 y.o. female with a past medical history significant for hypertension, PCOS, and recent urinary tract infection who presents with abdominal pain, nausea, vomiting. Patient reports that she had some epigastric abdominal pain in late August and had a negative right upper quadrant ultrasound at that time. She reports that the pain continued and got worse this week. She says since Monday, for the last 5 days, she has had severe unrelenting abdominal pain. She describes it as a 9 out of 10 in severity fluctuating to a 7 currently. She says it is sharp and pressure-like. She reports numerous episodes of nausea and nonbloody/nonbilious vomiting. She reports a normal bowel movement 2 days ago, denies any dysuria, hematuria, change in urine smell or appearance. She reports her last menstrual cycle was in September and it was normal. She denies any fevers, chills, chest pain, shortness of breath. She denies any recent abdominal trauma. She reports that she saw her PCP yesterday who expressed concern for ulcer versus other stomach etiology of discomfort. When the pain persisted this morning, she came to the ED.   Abdominal Pain   This is a recurrent problem. The current episode started more than 2 days ago. The problem occurs constantly. The problem has been gradually worsening. The pain is associated with eating. The pain is located in the epigastric region and RUQ. The quality of the pain is sharp and pressure-like. The pain is at a severity of 9/10. The pain is severe. Associated symptoms include nausea and vomiting. Pertinent negatives include fever, diarrhea, hematochezia, melena, constipation, dysuria, frequency and headaches. The symptoms are aggravated by palpation and eating. Nothing relieves the  symptoms. Past workup includes ultrasound (negative [place]). Past workup does not include CT scan.    Past Medical History:  Diagnosis Date  . ADD (attention deficit disorder)   . Hypertension   . PCOS (polycystic ovarian syndrome)     There are no active problems to display for this patient.   History reviewed. No pertinent surgical history.  OB History    No data available       Home Medications    Prior to Admission medications   Medication Sig Start Date End Date Taking? Authorizing Provider  ciprofloxacin (CIPRO) 500 MG tablet Take 1 tablet (500 mg total) by mouth 2 (two) times daily. 02/05/16   Tiffany Carlota Raspberry, PA-C  ibuprofen (ADVIL,MOTRIN) 200 MG tablet Take 600 mg by mouth every 6 (six) hours as needed for fever, headache, mild pain, moderate pain or cramping.    Historical Provider, MD  lisinopril-hydrochlorothiazide (PRINZIDE,ZESTORETIC) 20-25 MG tablet Take 1 tablet by mouth daily.    Historical Provider, MD  methylphenidate (RITALIN) 10 MG tablet Take 10 mg by mouth daily.    Historical Provider, MD  methylphenidate 27 MG PO CR tablet Take 27 mg by mouth daily with breakfast.    Historical Provider, MD  norgestimate-ethinyl estradiol ([place] 28) 0.25-35 MG-MCG tablet Take 1 tablet by mouth daily.    Historical Provider, MD  ondansetron (ZOFRAN) 4 MG tablet Take 1 tablet (4 mg total) by mouth every 6 (six) hours. 02/05/16   Delos Haring, PA-C  oxyCODONE-acetaminophen (PERCOCET/ROXICET) 5-325 MG tablet Take 1 tablet by mouth every 6 (six) hours as needed for severe pain. 02/05/16  Delos Haring, PA-C    Family History Family History  Problem Relation Age of Onset  . Diabetes Mother   . Hypertension Mother   . Diabetes Father   . Hypertension Father     Social History Social History  Substance Use Topics  . Smoking status: Never Smoker  . Smokeless tobacco: Never Used  . Alcohol use Yes     Comment: once a week     Allergies   Hydrocodone and Zoloft  [sertraline hcl]   Review of Systems Review of Systems  Constitutional: Negative for chills, diaphoresis, fatigue and fever.  HENT: Negative for congestion and rhinorrhea.   Eyes: Negative for visual disturbance.  Respiratory: Negative for cough, chest tightness, shortness of breath, wheezing and stridor.   Cardiovascular: Negative for chest pain and palpitations.  Gastrointestinal: Positive for abdominal pain, nausea and vomiting. Negative for abdominal distention, constipation, diarrhea, hematochezia and melena.  Genitourinary: Negative for difficulty urinating, dyspareunia, dysuria, flank pain, frequency, menstrual problem, vaginal bleeding, vaginal discharge and vaginal pain.  Musculoskeletal: Negative for back pain, neck pain and neck stiffness.  Skin: Negative for rash and wound.  Neurological: Negative for weakness, light-headedness and headaches.  Psychiatric/Behavioral: Negative for agitation and confusion.  All other systems reviewed and are negative.    Physical Exam Updated Vital Signs BP 139/99 (BP Location: Left Arm)   Pulse (!) 122   Temp 98.4 F (36.9 C) (Oral)   Resp 20   Ht 5\' 7"  (1.702 m)   Wt 245 lb (111.1 kg)   LMP 04/10/2016 (Exact Date)   SpO2 97%   BMI 38.37 kg/m   Physical Exam  Constitutional: She appears well-developed and well-nourished. No distress.  HENT:  Head: Normocephalic and atraumatic.  Mouth/Throat: Oropharynx is clear and moist. No oropharyngeal exudate.  Eyes: Conjunctivae and EOM are normal. Pupils are equal, round, and reactive to light.  Neck: Normal range of motion. Neck supple.  Cardiovascular: Regular rhythm.  Tachycardia present.   No murmur heard. Pulmonary/Chest: Effort normal and breath sounds normal. No stridor. No respiratory distress. She has no wheezes. She exhibits no tenderness.  Abdominal: Soft. Normal appearance and bowel sounds are normal. She exhibits no distension. There is tenderness in the right upper quadrant  and epigastric area. There is no rigidity, no rebound and no CVA tenderness.    Musculoskeletal: She exhibits no edema or tenderness.  Neurological: She is alert. She exhibits normal muscle tone.  Skin: Skin is warm and dry.  Psychiatric: She has a normal mood and affect.  Nursing note and vitals reviewed.    ED Treatments / Results  Labs (all labs ordered are listed, but only abnormal results are displayed) Labs Reviewed  CBC WITH DIFFERENTIAL/PLATELET - Abnormal; Notable for the following:       Result Value   WBC 14.6 (*)    RBC 5.51 (*)    Hemoglobin 15.3 (*)    HCT 46.3 (*)    Neutro Abs 11.4 (*)    All other components within normal limits  COMPREHENSIVE METABOLIC PANEL - Abnormal; Notable for the following:    Chloride 99 (*)    Total Protein 9.5 (*)    All other components within normal limits  URINALYSIS, ROUTINE W REFLEX MICROSCOPIC (NOT AT [facility]) - Abnormal; Notable for the following:    APPearance CLOUDY (*)    Ketones, ur 15 (*)    Leukocytes, UA LARGE (*)    All other components within normal limits  D-DIMER, QUANTITATIVE (NOT AT  [facility]) - Abnormal; Notable for the following:    D-Dimer, Quant 10.27 (*)    All other components within normal limits  URINE MICROSCOPIC-ADD ON - Abnormal; Notable for the following:    Squamous Epithelial / LPF 6-30 (*)    Bacteria, UA MANY (*)    All other components within normal limits  URINE CULTURE  LIPASE, BLOOD  I-STAT CG4 LACTIC ACID, ED  POC URINE PREG, ED  I-STAT CG4 LACTIC ACID, ED    EKG  EKG Interpretation None       Radiology Ct Angio Chest Pe W And/or Wo Contrast  Result Date: 05/02/2016 CLINICAL DATA:  Patient with diffuse abdominal pain. Elevated D-dimer. No chest complaints. EXAM: CT ANGIOGRAPHY CHEST WITH CONTRAST TECHNIQUE: Multidetector CT imaging of the chest was performed using the standard protocol during bolus administration of intravenous contrast. Multiplanar CT image reconstructions and MIPs  were obtained to evaluate the vascular anatomy. CONTRAST:  100 cc Isovue 370 COMPARISON:  None. FINDINGS: Cardiovascular: Normal heart size. No pericardial effusion. Aorta and main pulmonary artery are normal in caliber. Mediastinum/Nodes: No axillary, mediastinal or hilar lymphadenopathy. Normal esophagus. Lungs/Pleura: Central airways are patent. No large area of pulmonary consolidation. Minimal atelectasis within the lower lobes bilaterally. No pleural effusion or pneumothorax. Upper Abdomen: Small amount of upper abdominal ascites. Musculoskeletal: No aggressive or acute appearing osseous lesions. Review of the MIP images confirms the above findings. IMPRESSION: No evidence for pulmonary embolism. No acute process in the chest. Electronically Signed   By: Lovey Newcomer M.D.   On: 05/02/2016 10:35   Ct Abdomen Pelvis W Contrast  Result Date: 05/02/2016 CLINICAL DATA:  Patient with sharp stabbing abdominal pain, worse with eating and drinking. EXAM: CT ABDOMEN AND PELVIS WITH CONTRAST TECHNIQUE: Multidetector CT imaging of the abdomen and pelvis was performed using the standard protocol following bolus administration of intravenous contrast. CONTRAST:  100 cc Isovue 370 COMPARISON:  None. FINDINGS: Lower chest: Normal heart size. Minimal atelectasis within the lower lobes bilaterally. No pleural effusion. Hepatobiliary: Liver is normal in size and contour. No focal lesion identified. Gallbladder is unremarkable. Pancreas: Unremarkable Spleen: Unremarkable Adrenals/Urinary Tract: The adrenal glands are normal. Kidneys enhance symmetrically with contrast. No hydronephrosis. Urinary bladder is unremarkable. Stomach/Bowel: Within the central abdomen there are multiple loops of thick-walled distal small bowel to the level of the distal ileum. No evidence for upstream obstruction. Normal appendix. No free intraperitoneal air. Vascular/Lymphatic: Normal caliber abdominal aorta. No retroperitoneal lymphadenopathy.  Reproductive: Uterus is unremarkable. Left ovary is prominent and contains a 1.8 cm cyst. Other: Small amount of free fluid within the pelvis and upper abdomen. Musculoskeletal: No aggressive or acute appearing osseous lesions. IMPRESSION: Circumferential wall thickening of the distal small bowel to the level of the ileum. Findings are concerning for enteritis with considerations including infectious, inflammatory or ischemic etiologies. There is a small to moderate amount of free fluid within the pelvis and upper abdomen. Electronically Signed   By: Lovey Newcomer M.D.   On: 05/02/2016 10:26    Procedures Procedures (including critical care time)  Medications Ordered in ED Medications  sodium chloride 0.9 % bolus 1,000 mL (0 mLs Intravenous Stopped 05/02/16 1007)  ondansetron (ZOFRAN) injection 4 mg (4 mg Intravenous Given 05/02/16 0839)  morphine 4 MG/ML injection 4 mg (4 mg Intravenous Given 05/02/16 0839)  iopamidol (ISOVUE-300) 61 % injection 15 mL (15 mLs Oral Contrast Given 05/02/16 0747)  iopamidol (ISOVUE-370) 76 % injection 100 mL (100 mLs Intravenous Contrast Given 05/02/16  [id number])  ciprofloxacin (CIPRO) tablet 500 mg (500 mg Oral Given 05/02/16 1149)  metroNIDAZOLE (FLAGYL) tablet 500 mg (500 mg Oral Given 05/02/16 1149)  oxyCODONE-acetaminophen (PERCOCET/ROXICET) 5-325 MG per tablet 1 tablet (1 tablet Oral Given 05/02/16 1149)  ondansetron (ZOFRAN-ODT) disintegrating tablet 4 mg (4 mg Oral Given 05/02/16 1149)     Initial Impression / Assessment and Plan / ED Course  I have reviewed the triage vital signs and the nursing notes.  Pertinent labs & imaging results that were available during my care of the patient were reviewed by me and considered in my medical decision making (see chart for details).  Clinical Course   Madison Robertson is a 25 y.o. female with a past medical history significant for hypertension, PCOS, and recent urinary tract infection who presents with abdominal pain, nausea,  vomiting.  History and exam are seen above.  Given patient significant pain, patient will have laboratory testing and imaging to investigate etiology. Patient given pain medicine, nausea medicine, and fluids for symptom management.  Workup revealed concern for possible UTI. Patient had mild leukocytosis but no evidence of kidney dysfunction. LFTs and lipase nonelevated.  Patient also found to have elevated D dimer. With elevated D dimer, PE study ordered to rule out referred diaphragm pain as cause of abdominal pain.  CT imaging showed no evidence of pulmonary Embolism. No abnormal process in the chest. Abdominal imaging showed distal small bowel wall thickening concerning for enteritis. Also evidence of small to moderate amount of free fluid in pelvis. With lactic acid nonelevated, and abdominal pain significantly improving, Doubt ischemic cause of pain.   Patient given dose of oral antibiotics with Cipro and Flagyl for her colitis/enteritis. Patient given nausea medicine and pain medicine with continued improvement in symptoms.  Discussion had with patient and family for Disposition determination. As she is feeling better and no complications such as abscess or perforation appeared to be present, patient was felt appropriate for discharge. Patient agreed with plans to follow up with a PCP for further management as well as strict return precautions for new or worsening symptoms. Family had no other questions or concerns and patient was discharged in good condition.   Final Clinical Impressions(s) / ED Diagnoses   Final diagnoses:  Enteritis  Colitis  Abdominal pain, unspecified abdominal location    New Prescriptions Discharge Medication List as of 05/02/2016 11:59 AM    START taking these medications   Details  !! ciprofloxacin (CIPRO) 500 MG tablet Take 1 tablet (500 mg total) by mouth 2 (two) times daily., Starting Thu 05/02/2016, Until Thu 05/09/2016, Print    metroNIDAZOLE  (FLAGYL) 500 MG tablet Take 1 tablet (500 mg total) by mouth 3 (three) times daily., Starting Thu 05/02/2016, Until Thu 05/09/2016, Print    ondansetron (ZOFRAN ODT) 4 MG disintegrating tablet Take 1 tablet (4 mg total) by mouth every 8 (eight) hours as needed for nausea or vomiting., Starting Thu 05/02/2016, Print    !! oxyCODONE-acetaminophen (PERCOCET/ROXICET) 5-325 MG tablet Take 1 tablet by mouth every 4 (four) hours as needed for severe pain., Starting Thu 05/02/2016, Print     !! - Potential duplicate medications found. Please discuss with provider.     Clinical Impression: 1. Enteritis   2. Colitis   3. Abdominal pain, unspecified abdominal location     Disposition: Discharge  Condition: Good  I have discussed the results, Dx and Tx plan with the pt(& family if present). He/she/they expressed understanding and agree(s) with the plan. Discharge  instructions discussed at great length. Strict return precautions discussed and pt &/or family have verbalized understanding of the instructions. No further questions at time of discharge.    Discharge Medication List as of 05/02/2016 11:59 AM    START taking these medications   Details  !! ciprofloxacin (CIPRO) 500 MG tablet Take 1 tablet (500 mg total) by mouth 2 (two) times daily., Starting Thu 05/02/2016, Until Thu 05/09/2016, Print    metroNIDAZOLE (FLAGYL) 500 MG tablet Take 1 tablet (500 mg total) by mouth 3 (three) times daily., Starting Thu 05/02/2016, Until Thu 05/09/2016, Print    ondansetron (ZOFRAN ODT) 4 MG disintegrating tablet Take 1 tablet (4 mg total) by mouth every 8 (eight) hours as needed for nausea or vomiting., Starting Thu 05/02/2016, Print    !! oxyCODONE-acetaminophen (PERCOCET/ROXICET) 5-325 MG tablet Take 1 tablet by mouth every 4 (four) hours as needed for severe pain., Starting Thu 05/02/2016, Print     !! - Potential duplicate medications found. Please discuss with provider.      Follow Up: [facility] [phone] Schedule an appointment as soon as possible for a visit  If symptoms worsen, please return to the nearest ED.     Gwenyth Allegra Tegeler, MD 05/02/16 2034

## 2016-05-02 NOTE — ED Notes (Signed)
Patient transported to CT 

## 2016-05-02 NOTE — ED Triage Notes (Signed)
Pt is c/o abd pain that started on Monday  Pt states she has had nausea and vomiting since Monday and this morning she feels weak all over  Pt states the pain is in her midepigastric area and feels like someone is punching her in the same spot over and over again

## 2016-05-02 NOTE — ED Notes (Signed)
RN starting IV 

## 2016-05-02 NOTE — ED Notes (Signed)
Pt complains of abdominal pain that sharp and stabbing in nature, made worse by activity and eating and drinking, located midline below the umbilicus. Pt states she has been nauseous and been vomiting.  Pt denies blood in stool and vomitus.

## 2016-05-03 LAB — URINE CULTURE

## 2016-05-04 ENCOUNTER — Telehealth (HOSPITAL_BASED_OUTPATIENT_CLINIC_OR_DEPARTMENT_OTHER): Payer: Self-pay

## 2016-05-04 NOTE — Telephone Encounter (Signed)
Post ED Visit - Positive Culture Follow-up  Culture report reviewed by antimicrobial stewardship pharmacist:  []  Elenor Quinones, Pharm.D. []  Heide Guile, Pharm.D., BCPS []  Parks Neptune, Pharm.D. []  Alycia Rossetti, Pharm.D., BCPS []  [place], Pharm.D., BCPS, AAHIVP []  Legrand Como, Pharm.D., BCPS, AAHIVP []  Milus Glazier, Pharm.D. []  Stephens November, Pharm.D.  Positive urine culture Treated with Ciprofloxacin, organism sensitive to the same and no further patient follow-up is required at this time.  Genia Del 05/04/2016, 11:25 AM

## 2016-05-06 MED FILL — SERTRALINE HCL 50 MG TABLET: 50 | 30 days supply | Qty: 30 | Fill #2

## 2016-05-30 MED FILL — MONO-LINYAH 28 TABLET: 0.25-35 | 84 days supply | Qty: 84 | Fill #3

## 2016-05-30 MED FILL — OMEPRAZOLE DR 40 MG CAPSULE: 40 | 30 days supply | Qty: 30 | Fill #1

## 2016-05-30 MED FILL — SERTRALINE HCL 50 MG TABLET: 50 | 30 days supply | Qty: 30 | Fill #3

## 2016-06-03 DIAGNOSIS — H5202 Hypermetropia, left eye: Secondary | ICD-10-CM | POA: Diagnosis not present

## 2016-06-03 DIAGNOSIS — H52223 Regular astigmatism, bilateral: Secondary | ICD-10-CM | POA: Diagnosis not present

## 2016-06-07 MED FILL — METHYLPHENIDATE 10 MG TAB: 10 | 30 days supply | Qty: 30 | Fill #0

## 2016-06-07 MED FILL — METHYLPHENIDATE ER 27 MG TA: 27 | 30 days supply | Qty: 30 | Fill #0

## 2016-07-03 MED FILL — SERTRALINE HCL 50 MG TABLET: 50 | 30 days supply | Qty: 45 | Fill #0

## 2016-07-11 MED FILL — METHYLPHENIDATE ER 27 MG TA: 27 | 30 days supply | Qty: 30 | Fill #0

## 2016-08-01 ENCOUNTER — Other Ambulatory Visit (HOSPITAL_COMMUNITY): Payer: Self-pay | Admitting: Gastroenterology

## 2016-08-01 DIAGNOSIS — R112 Nausea with vomiting, unspecified: Secondary | ICD-10-CM | POA: Diagnosis not present

## 2016-08-01 DIAGNOSIS — R10811 Right upper quadrant abdominal tenderness: Secondary | ICD-10-CM

## 2016-08-01 DIAGNOSIS — R140 Abdominal distension (gaseous): Secondary | ICD-10-CM | POA: Diagnosis not present

## 2016-08-01 MED FILL — LISINOPRIL-HCTZ 20-25 MG TA: 20-25 | 90 days supply | Qty: 90 | Fill #2

## 2016-08-01 MED FILL — SERTRALINE HCL 50 MG TABLET: 50 | 30 days supply | Qty: 45 | Fill #1

## 2016-08-06 MED FILL — TRIAMCINOLONE 0.1% CREAM: 0.1 | 20 days supply | Qty: 60 | Fill #0

## 2016-08-06 MED FILL — MONO-LINYAH 28 TABLET: 0.25-35 | 28 days supply | Qty: 28 | Fill #0

## 2016-08-08 ENCOUNTER — Ambulatory Visit (HOSPITAL_COMMUNITY)
Admission: RE | Admit: 2016-08-08 | Discharge: 2016-08-08 | Disposition: A | Discharge status: Home / Self Care | Payer: 59 | Source: Ambulatory Visit | Attending: Gastroenterology | Admitting: Gastroenterology

## 2016-08-08 DIAGNOSIS — R112 Nausea with vomiting, unspecified: Secondary | ICD-10-CM | POA: Diagnosis not present

## 2016-08-08 DIAGNOSIS — R1011 Right upper quadrant pain: Secondary | ICD-10-CM | POA: Diagnosis not present

## 2016-08-08 DIAGNOSIS — R10811 Right upper quadrant abdominal tenderness: Secondary | ICD-10-CM | POA: Insufficient documentation

## 2016-08-08 MED ORDER — TECHNETIUM TC 99M MEBROFENIN IV KIT
5.5000 | PACK | Freq: Once | INTRAVENOUS | Status: AC | PRN
  Administered 2016-08-08: 5.5 via INTRAVENOUS

## 2016-08-12 MED FILL — METHYLPHENIDATE 10MG TAB: 10 | 30 days supply | Qty: 30 | Fill #0

## 2016-08-15 MED FILL — CONCERTA ER 27 MG TABLET: 27 | 30 days supply | Qty: 30 | Fill #0

## 2016-08-28 MED FILL — OMEPRAZOLE DR 40 MG CAPSULE: 40 | 30 days supply | Qty: 30 | Fill #2

## 2016-09-01 MED FILL — SERTRALINE HCL 50 MG TABLET: 50 | 30 days supply | Qty: 45 | Fill #2

## 2016-09-19 MED FILL — CONCERTA ER 27 MG TABLET: 27 | 30 days supply | Qty: 30 | Fill #0

## 2016-09-19 MED FILL — METHYLPHENIDATE 20 MG TAB: 20 | 30 days supply | Qty: 30 | Fill #0

## 2016-09-27 MED FILL — MONO-LINYAH 28 TABLET: 0.25-35 | 28 days supply | Qty: 28 | Fill #1

## 2016-10-07 MED FILL — ONDANSETRON HCL 4 MG TABLET: 4 | 30 days supply | Qty: 20 | Fill #1

## 2016-10-07 MED FILL — SERTRALINE HCL 50 MG TABLET: 50 | 30 days supply | Qty: 45 | Fill #3

## 2016-10-21 DIAGNOSIS — F900 Attention-deficit hyperactivity disorder, predominantly inattentive type: Secondary | ICD-10-CM | POA: Diagnosis not present

## 2016-10-21 MED FILL — METHYLPHENIDATE 20 MG TAB: 20 | 30 days supply | Qty: 30 | Fill #0

## 2016-10-21 MED FILL — CONCERTA ER 27 MG TABLET: 27 | 30 days supply | Qty: 30 | Fill #0

## 2016-10-22 MED FILL — MONO-LINYAH 28 TABLET: 0.25-35 | 84 days supply | Qty: 84 | Fill #2

## 2016-11-02 ENCOUNTER — Telehealth: Payer: 59 | Admitting: Nurse Practitioner

## 2016-11-02 DIAGNOSIS — H1013 Acute atopic conjunctivitis, bilateral: Secondary | ICD-10-CM | POA: Diagnosis not present

## 2016-11-02 NOTE — Progress Notes (Signed)
We are sorry that you are not feeling well.  Here is how we plan to help!  Based on what you have shared with me it looks like you have conjunctivitis.  Conjunctivitis is a common inflammatory or infectious condition of the eye that is often referred to as "pink eye".  In most cases it is contagious (viral or bacterial). However, not all conjunctivitis requires antibiotics (ex. Allergic).  We have made appropriate suggestions for you based upon your presentation.  I recommend that you use OpconA, 1-2 drops every 4-6 hours (an over the counter allergy drop available at your local pharmacy).  Your pharmacist may have an alternative suggestion.  Pink eye can be highly contagious.  It is typically spread through direct contact with secretions, or contaminated objects or surfaces that one may have touched.  Strict handwashing is suggested with soap and water is urged.  If not available, use alcohol based had sanitizer.  Avoid unnecessary touching of the eye.  If you wear contact lenses, you will need to refrain from wearing them until you see no white discharge from the eye for at least 24 hours after being on medication.  You should see symptom improvement in 1-2 days after starting the medication regimen.  Call us if symptoms are not improved in 1-2 days.  Home Care:  Wash your hands often!  Do not wear your contacts until you complete your treatment plan.  Avoid sharing towels, bed linen, personal items with a person who has pink eye.  See attention for anyone in your home with similar symptoms.  Get Help Right Away If:  Your symptoms do not improve.  You develop blurred or loss of vision.  Your symptoms worsen (increased discharge, pain or redness)  Your e-visit answers were reviewed by a board certified advanced clinical practitioner to complete your personal care plan.  Depending on the condition, your plan could have included both over the counter or prescription medications.  If there  is a problem please reply  once you have received a response from your provider.  Your safety is important to us.  If you have drug allergies check your prescription carefully.    You can use MyChart to ask questions about today's visit, request a non-urgent call back, or ask for a work or school excuse for 24 hours related to this e-Visit. If it has been greater than 24 hours you will need to follow up with your provider, or enter a new e-Visit to address those concerns.   You will get an e-mail in the next two days asking about your experience.  I hope that your e-visit has been valuable and will speed your recovery. Thank you for using e-visits.     

## 2016-11-06 MED FILL — SERTRALINE HCL 50 MG TABLET: 50 | 30 days supply | Qty: 45 | Fill #4

## 2016-11-06 MED FILL — OMEPRAZOLE DR 40 MG CAPSULE: 40 | 30 days supply | Qty: 30 | Fill #3

## 2016-11-06 MED FILL — LISINOPRIL-HCTZ 20-25 MG TA: 20-25 | 90 days supply | Qty: 90 | Fill #3

## 2016-11-12 IMAGING — US US ABDOMEN LIMITED
1 series · 14 of 25 positions shown · non-contrast
Comparison: CT abdomen and pelvis 08/15/2007

CLINICAL DATA: Intermittent right upper quadrant pain for
weeks. Nausea and vomiting.

EXAM:
US ABDOMEN LIMITED - RIGHT UPPER QUADRANT

[Series 1: us abdomen limited · 0.20mm/px · 14 of 47 slices shown]
[im 1/47]
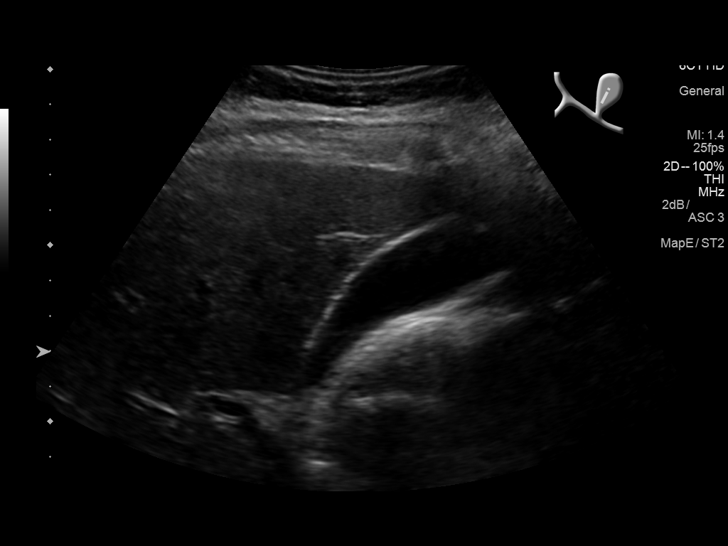
[im 4/47]
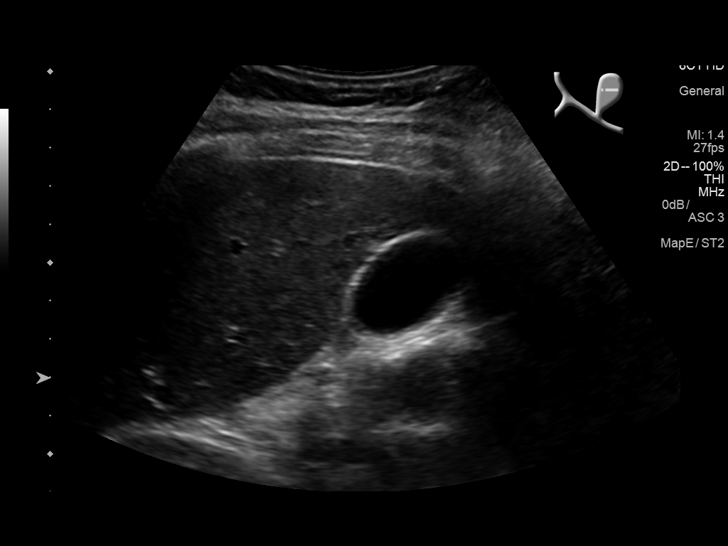
[im 8/47]
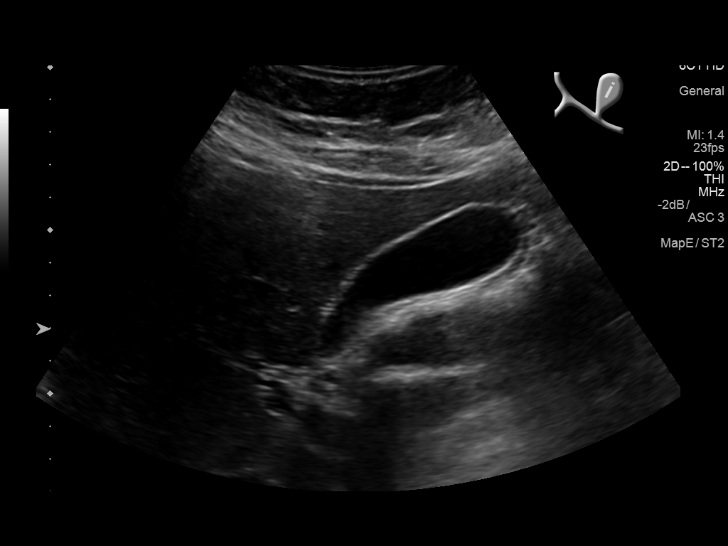
[im 12/47]
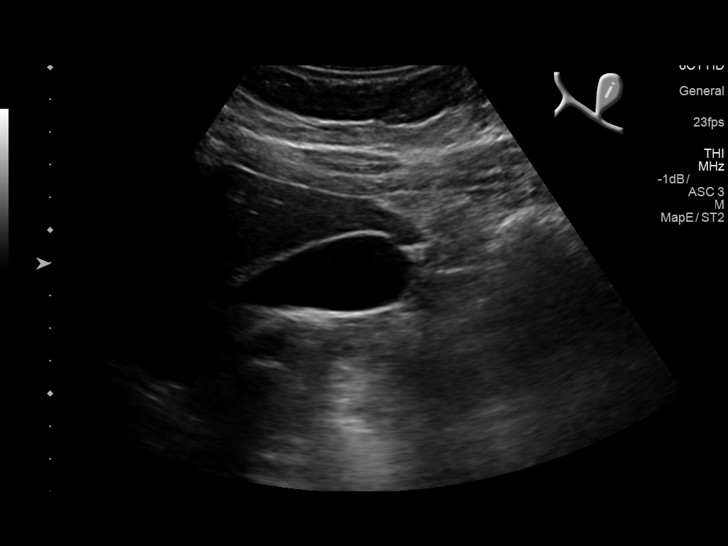
[im 16/47]
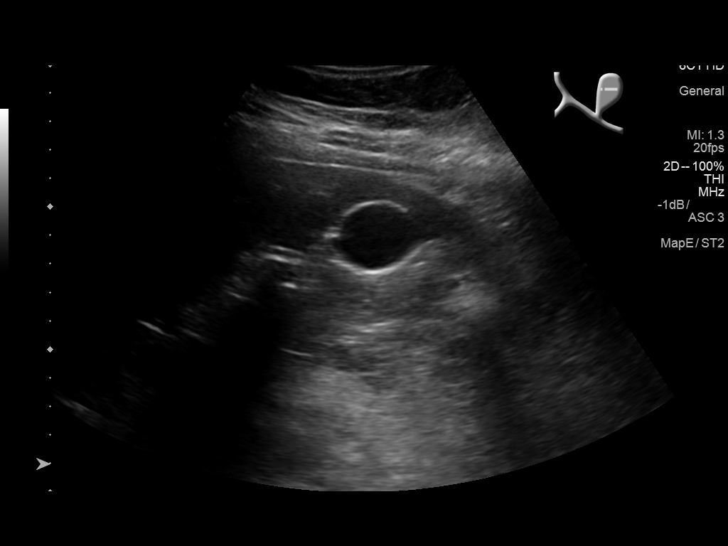
[im 18/47]
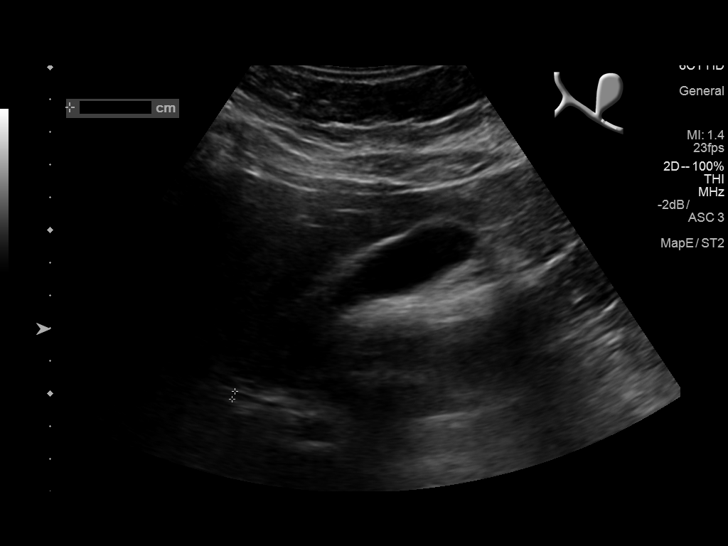
[im 22/47]
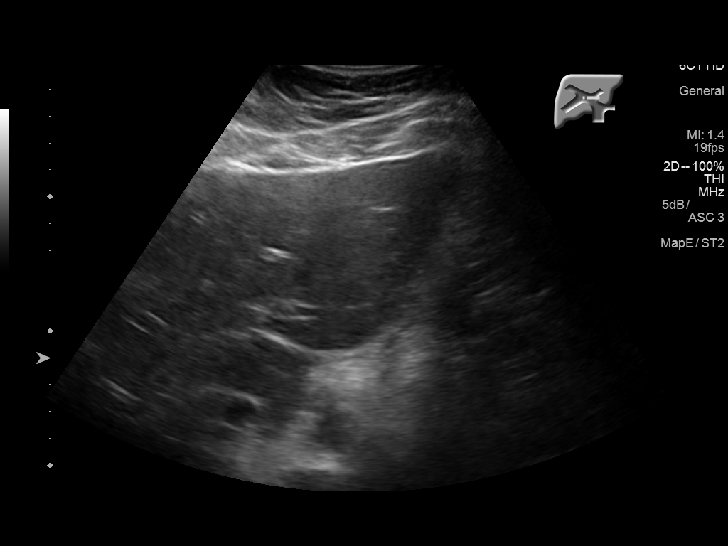
[im 25/47]
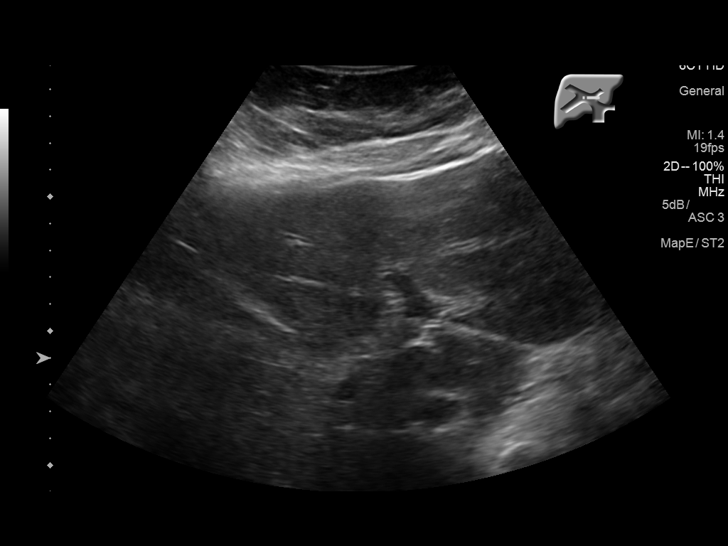
[im 29/47]
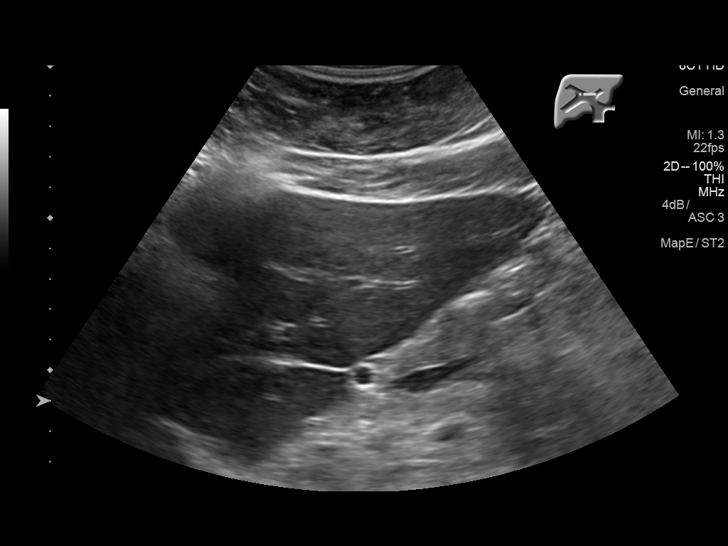
[im 31/47]
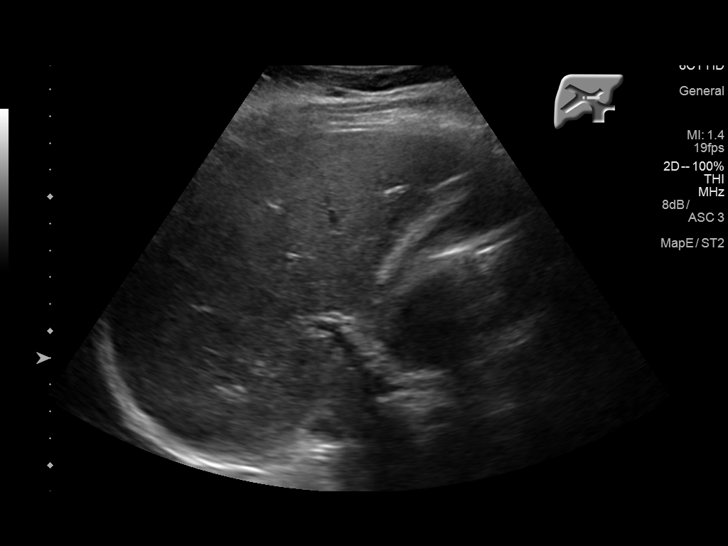
[im 35/47]
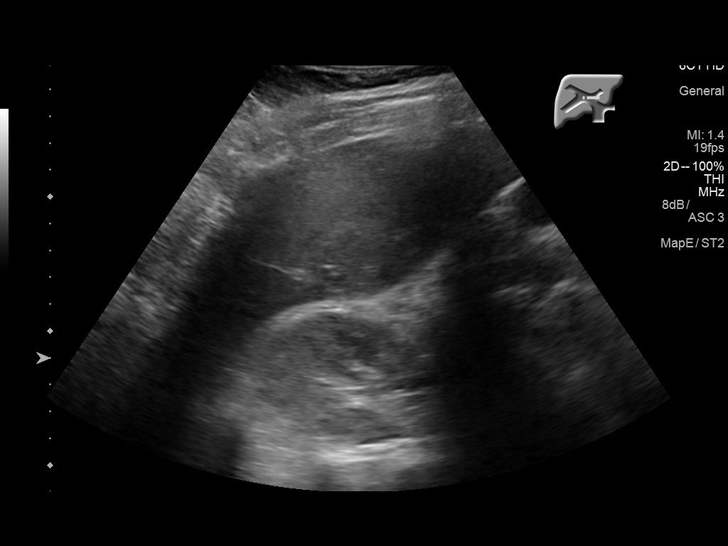
[im 39/47]
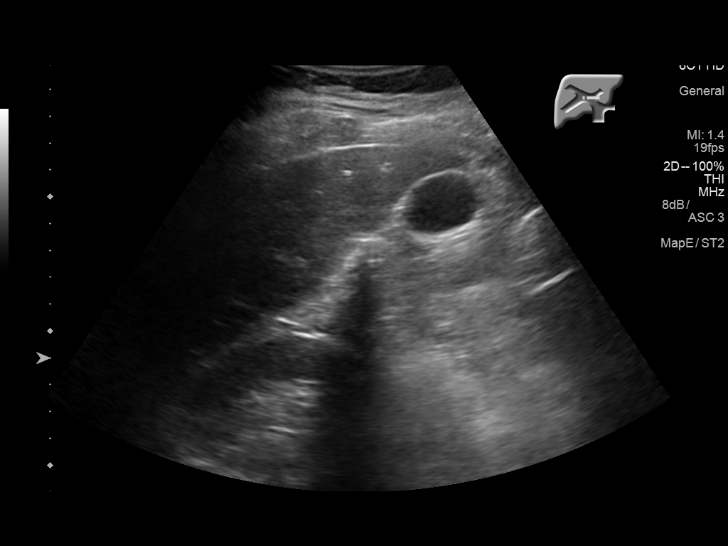
[im 43/47]
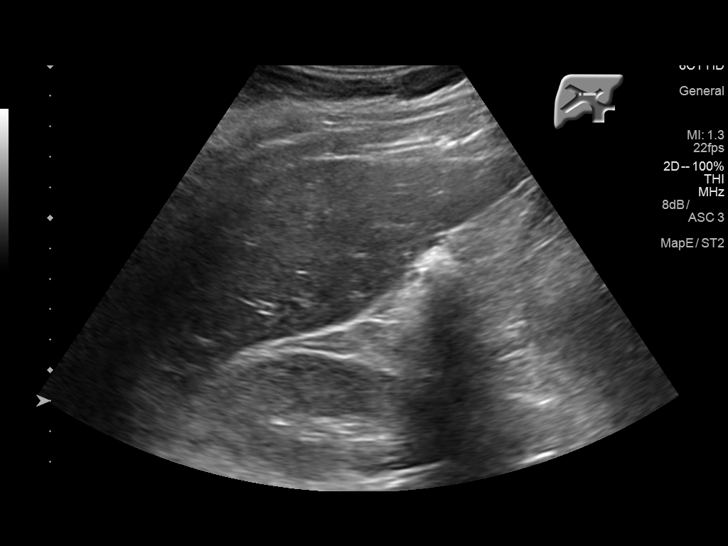
[im 47/47]
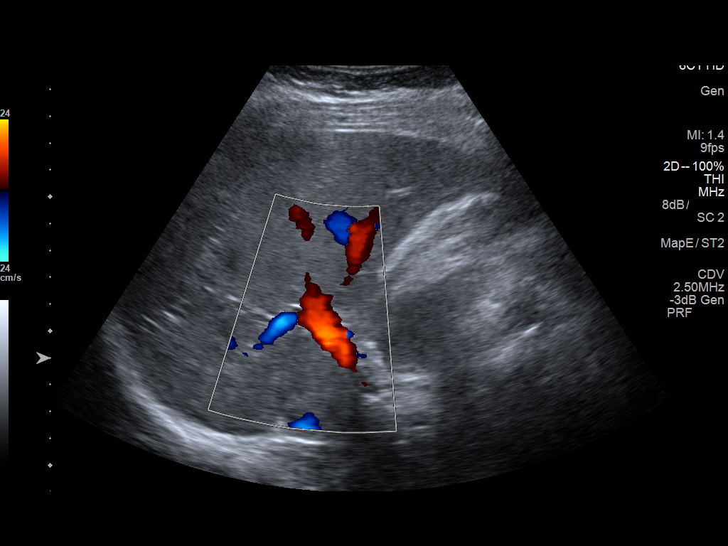

[14 of 25 positions shown; findings below may reference images not displayed]

FINDINGS: Gallbladder:

No gallstones or wall thickening visualized. No sonographic Murphy
sign noted by sonographer.

Common bile duct:

Diameter: 2.5 mm, normal

Liver:

No focal lesion identified. Within normal limits in parenchymal
echogenicity.
IMPRESSION: No evidence of cholelithiasis or cholecystitis.

## 2016-11-22 MED FILL — CONCERTA ER 27 MG TABLET: 27 | 30 days supply | Qty: 30 | Fill #0

## 2016-11-22 MED FILL — METHYLPHENIDATE 20 MG TAB: 20 | 30 days supply | Qty: 30 | Fill #0

## 2016-12-25 MED FILL — SERTRALINE HCL 50 MG TABLET: 50 | 30 days supply | Qty: 45 | Fill #5

## 2016-12-25 MED FILL — CONCERTA ER 27 MG TABLET: 27 | 30 days supply | Qty: 30 | Fill #0

## 2016-12-25 MED FILL — METHYLPHENIDATE 20 MG TAB: 20 | 30 days supply | Qty: 30 | Fill #0

## 2016-12-27 ENCOUNTER — Telehealth: Payer: Self-pay | Admitting: Gastroenterology

## 2016-12-27 NOTE — Telephone Encounter (Signed)
Received referral and records from [place] at [facility] to schedule patient an OV for abd pain. Patient states the she felt Dr. Michail Sermon did not help her with her GI issues and is requesting to transfer to our office.   Dr. Loletha Carrow is Doc of the Day for 4/30/18am. Records placed on his desk for review.

## 2016-12-31 NOTE — Telephone Encounter (Signed)
Left message for patient to return my call.

## 2016-12-31 NOTE — Telephone Encounter (Signed)
Please make her a new patient appointment with me. Records reviewed. Please ask her if EGD and/or colonoscopy were done after the visit with Dr Michail Sermon.  If so, please obtain reports prior to visit with me.  - HD

## 2017-01-08 ENCOUNTER — Encounter: Payer: Self-pay | Admitting: Gastroenterology

## 2017-01-22 MED FILL — SERTRALINE HCL 100 MG TAB: 100 | 90 days supply | Qty: 90 | Fill #0

## 2017-01-22 MED FILL — CONCERTA ER 27 MG TABLET: 27 | 30 days supply | Qty: 30 | Fill #0

## 2017-01-22 MED FILL — METHYLPHENIDATE 20 MG TAB: 20 | 30 days supply | Qty: 30 | Fill #0

## 2017-01-24 NOTE — Telephone Encounter (Signed)
Left message to return call 

## 2017-01-27 ENCOUNTER — Telehealth: Payer: Self-pay | Admitting: Gastroenterology

## 2017-01-27 MED FILL — ONDANSETRON HCL 4 MG TABLET: 4 | 30 days supply | Qty: 20 | Fill #0

## 2017-01-27 NOTE — Telephone Encounter (Signed)
Pt returned call. Please see other phone note for full details.

## 2017-01-27 NOTE — Telephone Encounter (Signed)
Pt returned call. States she has not had a EGD or colonoscopy before. Only had a HIDA scan done at [facility].

## 2017-01-31 MED FILL — OMEPRAZOLE DR 40 MG CAPSULE: 40 | 30 days supply | Qty: 30 | Fill #4

## 2017-01-31 MED FILL — NORG-ETHIN ESTRA 0.25-0.035: 0.25-35 | 84 days supply | Qty: 84 | Fill #3

## 2017-01-31 MED FILL — TRIAMCINOLONE 0.1% CREAM: 0.1 | 20 days supply | Qty: 60 | Fill #1

## 2017-02-10 MED FILL — LISINOPRIL-HCTZ 20-25 MG TA: 20-25 | 90 days supply | Qty: 90 | Fill #0

## 2017-02-25 ENCOUNTER — Encounter: Payer: Self-pay | Admitting: Gastroenterology

## 2017-02-25 ENCOUNTER — Ambulatory Visit (INDEPENDENT_AMBULATORY_CARE_PROVIDER_SITE_OTHER): Payer: 59 | Admitting: Gastroenterology

## 2017-02-25 VITALS — BP 110/78 | HR 76 | Ht 67.0 in | Wt 262.8 lb

## 2017-02-25 DIAGNOSIS — G43A0 Cyclical vomiting, not intractable: Secondary | ICD-10-CM | POA: Diagnosis not present

## 2017-02-25 DIAGNOSIS — R198 Other specified symptoms and signs involving the digestive system and abdomen: Secondary | ICD-10-CM | POA: Diagnosis not present

## 2017-02-25 DIAGNOSIS — R1115 Cyclical vomiting syndrome unrelated to migraine: Secondary | ICD-10-CM

## 2017-02-25 DIAGNOSIS — R1011 Right upper quadrant pain: Secondary | ICD-10-CM | POA: Diagnosis not present

## 2017-02-25 NOTE — Patient Instructions (Signed)
If you are age 28 or older, your body mass index should be between 23-30. Your Body mass index is 41.16 kg/m. If this is out of the aforementioned range listed, please consider follow up with your Primary Care Provider.  If you are age 15 or younger, your body mass index should be between 19-25. Your Body mass index is 41.16 kg/m. If this is out of the aformentioned range listed, please consider follow up with your Primary Care Provider.   You have been scheduled for an endoscopy and colonoscopy. Please follow the written instructions given to you at your visit today. Please pick up your prep supplies at the pharmacy within the next 1-3 days. If you use inhalers (even only as needed), please bring them with you on the day of your procedure. Your physician has requested that you go to www.startemmi.com and enter the access code given to you at your visit today. This web site gives a general overview about your procedure. However, you should still follow specific instructions given to you by our office regarding your preparation for the procedure.  Thank you for choosing [facility]  Dr Wilfrid Lund III

## 2017-02-25 NOTE — Progress Notes (Signed)
[facility] Consult Note:  History: Madison Robertson 02/25/2017  Referring physician: Maurice Small, MD  Reason for consult/chief complaint: Abdominal Pain (RUQ pain x 1 year with nausea and vomiting) and Diarrhea (alternating with constipation x 1 month)   Subjective  HPI:  This is a 26 year old woman referred by primary care noted above for abdominal pain and altered bowel habits. She reports about a year of episodic right upper quadrant abdominal pain that will often radiate down to the right lower quadrant. She often has associated nausea or vomiting that can be severe. It is apparently led to ED visits over the last year, most recently in October 2017. At that visit, CT abdomen and pelvis suggested terminal ileal inflammation. Shortly after that she saw Dr.Schooler of [facility], who ordered a CCK-HIDA scan. This returned normal, and there was some consideration of endoscopic procedures but the patient was unable to follow up due to a canceled appointment. She felt she needed a second opinion, so she requested the visit with [place] today. [name] has not noticed any clear food triggers. Stress has sometimes triggered the symptoms, as it did late last week with some work-related stress/anxiety. She tends to alternate between constipation and diarrhea and has not seen rectal bleeding.  ROS:  Review of Systems  Constitutional: Negative for appetite change and unexpected weight change.  HENT: Negative for mouth sores and voice change.   Eyes: Negative for pain and redness.  Respiratory: Negative for cough and shortness of breath.   Cardiovascular: Negative for chest pain and palpitations.  Genitourinary: Negative for dysuria and hematuria.  Musculoskeletal: Negative for arthralgias and myalgias.  Skin: Positive for rash. Negative for pallor.       Eczema  Neurological: Negative for weakness and headaches.  Hematological: Negative for adenopathy.  Psychiatric/Behavioral:  The patient is nervous/anxious.    She denies painful eye redness, swollen joints or additional rashes besides eczema  Past Medical History: Past Medical History:  Diagnosis Date  . ADD (attention deficit disorder)   . Hypertension   . PCOS (polycystic ovarian syndrome)      Past Surgical History: History reviewed. No pertinent surgical history.   Family History: Family History  Problem Relation Age of Onset  . Diabetes Mother   . Hypertension Mother   . Colon polyps Mother   . Diabetes Father   . Hypertension Father   . Congestive Heart Failure Father   . Prostate cancer Paternal Grandfather   . Breast cancer Other        Paternal great aunt,distal cousin,   . Ovarian cancer Other        Paternal great grandmother  . Uterine cancer Other   . Colon cancer Maternal Grandfather    A grandmother may have had esophageal cancer  Social History: Social History   Social History  . Marital status: Single    Spouse name: N/A  . Number of children: N/A  . Years of education: N/A   Social History Main Topics  . Smoking status: Never Smoker  . Smokeless tobacco: Never Used  . Alcohol use Yes     Comment: once a week  . Drug use: No  . Sexual activity: Not Asked   Other Topics Concern  . None   Social History Narrative  . None    Allergies: Allergies  Allergen Reactions  . Hydrocodone Nausea And Vomiting    Outpatient Meds: Current Outpatient Prescriptions  Medication Sig Dispense Refill  . ibuprofen (ADVIL,MOTRIN) 200  MG tablet Take 400 mg by mouth every 6 (six) hours as needed for fever, headache, moderate pain or cramping.     Marland Kitchen lisinopril-hydrochlorothiazide (PRINZIDE,ZESTORETIC) 20-25 MG tablet Take 1 tablet by mouth daily.    . methylphenidate (RITALIN) 10 MG tablet Take 20 mg by mouth every evening.     . methylphenidate 27 MG PO CR tablet Take 27 mg by mouth daily with breakfast.    . norgestimate-ethinyl estradiol (SPRINTEC 28) 0.25-35 MG-MCG  tablet Take 1 tablet by mouth daily.    . ondansetron (ZOFRAN ODT) 4 MG disintegrating tablet Take 1 tablet (4 mg total) by mouth every 8 (eight) hours as needed for nausea or vomiting. 20 tablet 0  . ondansetron (ZOFRAN) 4 MG tablet Take 1 tablet (4 mg total) by mouth every 6 (six) hours. 12 tablet 0  . PROMETHEGAN 25 MG suppository Place 25 mg rectally daily as needed for nausea or vomiting.   0  . sertraline (ZOLOFT) 50 MG tablet Take 100 mg by mouth daily.   11  . triamcinolone cream (KENALOG) 0.1 % Apply 1 application topically daily as needed (eczema).   1   No current facility-administered medications for this visit.       ___________________________________________________________________ Objective   Exam:  BP 110/78   Pulse 76   Ht 5\' 7"  (1.702 m)   Wt 262 lb 12.8 oz (119.2 kg)   LMP 01/21/2017 (Approximate)   BMI 41.16 kg/m  Her mother is present for the entire visit today  General: this is a(n) obese and well-appearing young woman, pleasant and conversational   Eyes: sclera anicteric, no redness  ENT: oral mucosa moist without lesions, no cervical or supraclavicular lymphadenopathy, good dentition  CV: RRR without murmur, S1/S2, no JVD, no peripheral edema  Resp: clear to auscultation bilaterally, normal RR and effort noted  GI: soft, mild RUQ tenderness, with active bowel sounds. No guarding or palpable organomegaly noted.  Skin; warm and dry, no rash or jaundice noted  Neuro: awake, alert and oriented x 3. Normal gross motor function and fluent speech  Labs:  CBC    Component Value Date/Time   WBC 14.6 (H) 05/02/2016 0822   RBC 5.51 (H) 05/02/2016 0822   HGB 15.3 (H) 05/02/2016 0822   HCT 46.3 (H) 05/02/2016 0822   PLT 266 05/02/2016 0822   MCV 84.0 05/02/2016 0822   MCH 27.8 05/02/2016 0822   MCHC 33.0 05/02/2016 0822   RDW 14.8 05/02/2016 0822   LYMPHSABS 2.4 05/02/2016 0822   MONOABS 0.6 05/02/2016 0822   EOSABS 0.1 05/02/2016 0822   BASOSABS  0.0 05/02/2016 6237     Radiologic Studies:  CT abdomen and pelvis findings October 2017 as noted above. Images were personally reviewed. CCK HIDA scan normal  Assessment: Encounter Diagnoses  Name Primary?  . RUQ pain Yes  . Non-intractable cyclical vomiting with nausea   . Change in bowel function     It is not clear if these are functional/anxiety related symptoms or IBD, given the CT scan findings.  Plan:  EGD/colonoscopy She is agreeable after discussion of the procedures and risks.  The benefits and risks of the planned procedure were described in detail with the patient or (when appropriate) their health care proxy.  Risks were outlined as including, but not limited to, bleeding, infection, perforation, adverse medication reaction leading to cardiac or pulmonary decompensation, or pancreatitis (if ERCP).  The limitation of incomplete mucosal visualization was also discussed.  No guarantees or warranties  were given.    Thank you for the courtesy of this consult.  Please call me with any questions or concerns.  Nelida Meuse III  CC: Maurice Small, MD

## 2017-02-26 ENCOUNTER — Encounter: Payer: Self-pay | Admitting: Gastroenterology

## 2017-03-03 MED FILL — CONCERTA ER 27 MG TABLET: 27 | 30 days supply | Qty: 30 | Fill #0

## 2017-03-10 ENCOUNTER — Ambulatory Visit (AMBULATORY_SURGERY_CENTER): Payer: 59 | Admitting: Gastroenterology

## 2017-03-10 ENCOUNTER — Encounter: Payer: Self-pay | Admitting: Gastroenterology

## 2017-03-10 VITALS — BP 100/63 | HR 100 | Temp 99.6°F | Resp 22 | Ht 67.0 in | Wt 262.0 lb

## 2017-03-10 DIAGNOSIS — R198 Other specified symptoms and signs involving the digestive system and abdomen: Secondary | ICD-10-CM

## 2017-03-10 DIAGNOSIS — Z6841 Body Mass Index (BMI) 40.0 and over, adult: Secondary | ICD-10-CM | POA: Diagnosis not present

## 2017-03-10 DIAGNOSIS — K2950 Unspecified chronic gastritis without bleeding: Secondary | ICD-10-CM | POA: Diagnosis not present

## 2017-03-10 DIAGNOSIS — R1115 Cyclical vomiting syndrome unrelated to migraine: Secondary | ICD-10-CM

## 2017-03-10 DIAGNOSIS — F900 Attention-deficit hyperactivity disorder, predominantly inattentive type: Secondary | ICD-10-CM | POA: Diagnosis not present

## 2017-03-10 DIAGNOSIS — R1011 Right upper quadrant pain: Secondary | ICD-10-CM | POA: Diagnosis present

## 2017-03-10 DIAGNOSIS — R194 Change in bowel habit: Secondary | ICD-10-CM | POA: Diagnosis not present

## 2017-03-10 DIAGNOSIS — I10 Essential (primary) hypertension: Secondary | ICD-10-CM | POA: Diagnosis not present

## 2017-03-10 DIAGNOSIS — G43A0 Cyclical vomiting, not intractable: Secondary | ICD-10-CM

## 2017-03-10 MED ORDER — SODIUM CHLORIDE 0.9 % IV SOLN: 500.0000 mL | INTRAVENOUS | Status: AC

## 2017-03-10 NOTE — Op Note (Signed)
[facility] Patient Name: Madison Robertson Procedure Date: 03/10/2017 3:14 PM MRN: [id number] Endoscopist: Mallie Mussel L. Loletha Carrow , MD Age: 26 Referring MD:  Date of Birth: 1990-09-30 Gender: Female Account #: [id number] Procedure:                Upper GI endoscopy Indications:              Abdominal pain in the right upper quadrant Medicines:                Monitored Anesthesia Care Procedure:                Pre-Anesthesia Assessment:                           - Prior to the procedure, a History and Physical                            was performed, and patient medications and                            allergies were reviewed. The patient's tolerance of                            previous anesthesia was also reviewed. The risks                            and benefits of the procedure and the sedation                            options and risks were discussed with the patient.                            All questions were answered, and informed consent                            was obtained. Prior Anticoagulants: The patient has                            taken no previous anticoagulant or antiplatelet                            agents. ASA Grade Assessment: II - A patient with                            mild systemic disease. After reviewing the risks                            and benefits, the patient was deemed in                            satisfactory condition to undergo the procedure.                           After obtaining informed consent, the endoscope was  passed under direct vision. Throughout the                            procedure, the patient's blood pressure, pulse, and                            oxygen saturations were monitored continuously. The                            Model GIF-[id number] scope was introduced                            through the mouth, and advanced to the second part                            of  duodenum. The upper GI endoscopy was                            accomplished without difficulty. The patient                            tolerated the procedure well. Retroflexion was                            performed in the stomach, but photos could not be                            obtained due to repeated belching. Scope In: Scope Out: Findings:                 The esophagus was normal.                           The entire examined stomach was normal. Biopsies                            were taken with a cold forceps for histology (2                            each, antrum/body).                           The examined duodenum was normal. Complications:            No immediate complications. Estimated Blood Loss:     Estimated blood loss: none. Impression:               - Normal esophagus.                           - Normal stomach. Biopsied.                           - Normal examined duodenum. Recommendation:           - Patient has a contact number available for  emergencies. The signs and symptoms of potential                            delayed complications were discussed with the                            patient. Return to normal activities tomorrow.                            Written discharge instructions were provided to the                            patient.                           - Resume previous diet.                           - Continue present medications.                           - Await pathology results.                           - See the other procedure note for documentation of                            additional recommendations. Camiah Humm L. Loletha Carrow, MD 03/10/2017 3:57:09 PM This report has been signed electronically.

## 2017-03-10 NOTE — Progress Notes (Signed)
  [facility] Post-op Note  Patient: FAITHLYN RECKTENWALD  Procedure(s) Performed: colonoscopy and endoscopy  Patient Location: [facility] - Recovery Area  Anesthesia Type: Deep Sedation/Propofol  Level of Consciousness: awake, oriented and patient cooperative  Airway and Oxygen Therapy: Patient Spontanous Breathing  Post-op Pain: none  Post-op Assessment:  Post-op Vital signs reviewed, Patient's Cardiovascular Status Stable, Respiratory Function Stable, Patent Airway, No signs of Nausea or vomiting and Pain level controlled  Post-op Vital Signs: Reviewed and stable  Complications: No apparent anesthesia complications  Ariann Khaimov E Leea Rambeau 3:59 PM

## 2017-03-10 NOTE — Patient Instructions (Signed)
YOU HAD AN ENDOSCOPIC PROCEDURE TODAY AT THE [facility]:   Refer to the procedure report that was given to you for any specific questions about what was found during the examination.  If the procedure report does not answer your questions, please call your gastroenterologist to clarify.  If you requested that your care partner not be given the details of your procedure findings, then the procedure report has been included in a sealed envelope for you to review at your convenience later.  YOU SHOULD EXPECT: Some feelings of bloating in the abdomen. Passage of more gas than usual.  Walking can help get rid of the air that was put into your GI tract during the procedure and reduce the bloating. If you had a lower endoscopy (such as a colonoscopy or flexible sigmoidoscopy) you may notice spotting of blood in your stool or on the toilet paper. If you underwent a bowel prep for your procedure, you may not have a normal bowel movement for a few days.  Please Note:  You might notice some irritation and congestion in your nose or some drainage.  This is from the oxygen used during your procedure.  There is no need for concern and it should clear up in a day or so.  SYMPTOMS TO REPORT IMMEDIATELY:   Following lower endoscopy (colonoscopy or flexible sigmoidoscopy):  Excessive amounts of blood in the stool  Significant tenderness or worsening of abdominal pains  Swelling of the abdomen that is new, acute  Fever of 100F or higher   Following upper endoscopy (EGD)  Vomiting of blood or coffee ground material  New chest pain or pain under the shoulder blades  Painful or persistently difficult swallowing  New shortness of breath  Fever of 100F or higher  Black, tarry-looking stools  For urgent or emergent issues, a gastroenterologist can be reached at any hour by calling [phone].   DIET:  We do recommend a small meal at first, but then you may proceed to your regular diet.  Drink  plenty of fluids but you should avoid alcoholic beverages for 24 hours.  ACTIVITY:  You should plan to take it easy for the rest of today and you should NOT DRIVE or use heavy machinery until tomorrow (because of the sedation medicines used during the test).    FOLLOW UP: Our staff will call the number listed on your records the next business day following your procedure to check on you and address any questions or concerns that you may have regarding the information given to you following your procedure. If we do not reach you, we will leave a message.  However, if you are feeling well and you are not experiencing any problems, there is no need to return our call.  We will assume that you have returned to your regular daily activities without incident.  If any biopsies were taken you will be contacted by phone or by letter within the next 1-3 weeks.  Please call us at [phone] if you have not heard about the biopsies in 3 weeks.    SIGNATURES/CONFIDENTIALITY: You and/or your care partner have signed paperwork which will be entered into your electronic medical record.  These signatures attest to the fact that that the information above on your After Visit Summary has been reviewed and is understood.  Full responsibility of the confidentiality of this discharge information lies with you and/or your care-partner. 

## 2017-03-10 NOTE — Op Note (Signed)
[facility] Patient Name: Madison Robertson Procedure Date: 03/10/2017 3:14 PM MRN: [id number] Endoscopist: Mallie Mussel L. Loletha Carrow , MD Age: 26 Referring MD:  Date of Birth: 10/24/1990 Gender: Female Account #: [id number] Procedure:                Colonoscopy Indications:              Abdominal pain in the right lower quadrant,                            Abnormal CT of the GI tract (10.2017, suggesting TI                            thickening) Medicines:                Monitored Anesthesia Care Procedure:                Pre-Anesthesia Assessment:                           - Prior to the procedure, a History and Physical                            was performed, and patient medications and                            allergies were reviewed. The patient's tolerance of                            previous anesthesia was also reviewed. The risks                            and benefits of the procedure and the sedation                            options and risks were discussed with the patient.                            All questions were answered, and informed consent                            was obtained. Prior Anticoagulants: The patient has                            taken no previous anticoagulant or antiplatelet                            agents. ASA Grade Assessment: II - A patient with                            mild systemic disease. After reviewing the risks                            and benefits, the patient was deemed in  satisfactory condition to undergo the procedure.                           After obtaining informed consent, the colonoscope                            was passed under direct vision. Throughout the                            procedure, the patient's blood pressure, pulse, and                            oxygen saturations were monitored continuously. The                            Colonoscope was introduced through the anus and                          advanced to the the terminal ileum. The colonoscopy                            was performed without difficulty. The patient                            tolerated the procedure well. The quality of the                            bowel preparation was good. The ileocecal valve,                            appendiceal orifice, and rectum were photographed.                            The quality of the bowel preparation was evaluated                            using the BBPS [facility] Bowel Preparation Scale)                            with scores of: Right Colon = 2, Transverse Colon =                            2 and Left Colon = 2. The total BBPS score equals                            6. After lavage. The bowel preparation used was                            SUPREP. Scope In: 3:41:13 PM Scope Out: 3:52:49 PM Scope Withdrawal Time: 0 hours 7 minutes 20 seconds  Total Procedure Duration: 0 hours 11 minutes 36 seconds  Findings:                 The perianal and digital rectal examinations were  normal.                           The terminal ileum appeared normal.                           The entire examined colon appeared normal on direct                            and retroflexion views. Complications:            No immediate complications. Estimated Blood Loss:     Estimated blood loss: none. Impression:               - The examined portion of the ileum was normal.                           - The entire examined colon is normal on direct and                            retroflexion views.                           - No specimens collected.                           Symptoms are most consistent with a functional                            bowel disorder/IBS. Recommendation:           - Patient has a contact number available for                            emergencies. The signs and symptoms of potential                            delayed  complications were discussed with the                            patient. Return to normal activities tomorrow.                            Written discharge instructions were provided to the                            patient.                           - Resume previous diet.                           - Continue present medications.                           - No repeat colonoscopy due to age.                           -  Return to referring physician. Henry L. Loletha Carrow, MD 03/10/2017 4:01:44 PM This report has been signed electronically.

## 2017-03-10 NOTE — Progress Notes (Signed)
Called to room to assist during endoscopic procedure.  Patient ID and intended procedure confirmed with present staff. Received instructions for my participation in the procedure from the performing physician.  

## 2017-03-11 ENCOUNTER — Telehealth: Payer: Self-pay | Admitting: *Deleted

## 2017-03-11 NOTE — Telephone Encounter (Signed)
  Follow up Call-  Call back number 03/10/2017  Post procedure Call Back phone  # [phone] cell  Permission to leave phone message Yes  Some recent data might be hidden     Patient questions:  Do you have a fever, pain , or abdominal swelling? No. Pain Score  0 *  Have you tolerated food without any problems? Yes.    Have you been able to return to your normal activities? Yes.    Do you have any questions about your discharge instructions: Diet   No. Medications  No. Follow up visit  No.  Do you have questions or concerns about your Care? No.  Actions: * If pain score is 4 or above: No action needed, pain <4.

## 2017-03-21 MED FILL — METHYLPHENIDATE 20 MG TAB: 20 | 30 days supply | Qty: 30 | Fill #0

## 2017-04-01 MED FILL — CONCERTA ER 27 MG TABLET: 27 | 30 days supply | Qty: 30 | Fill #0

## 2017-04-04 MED FILL — OMEPRAZOLE DR 40 MG CAPSULE: 40 | 30 days supply | Qty: 30 | Fill #5

## 2017-04-23 DIAGNOSIS — Z0000 Encounter for general adult medical examination without abnormal findings: Secondary | ICD-10-CM | POA: Diagnosis not present

## 2017-04-23 DIAGNOSIS — F321 Major depressive disorder, single episode, moderate: Secondary | ICD-10-CM | POA: Diagnosis not present

## 2017-04-23 DIAGNOSIS — E782 Mixed hyperlipidemia: Secondary | ICD-10-CM | POA: Diagnosis not present

## 2017-04-23 DIAGNOSIS — Z23 Encounter for immunization: Secondary | ICD-10-CM | POA: Diagnosis not present

## 2017-04-23 DIAGNOSIS — Z6841 Body Mass Index (BMI) 40.0 and over, adult: Secondary | ICD-10-CM | POA: Diagnosis not present

## 2017-04-23 DIAGNOSIS — I10 Essential (primary) hypertension: Secondary | ICD-10-CM | POA: Diagnosis not present

## 2017-04-23 DIAGNOSIS — E282 Polycystic ovarian syndrome: Secondary | ICD-10-CM | POA: Diagnosis not present

## 2017-04-23 DIAGNOSIS — F900 Attention-deficit hyperactivity disorder, predominantly inattentive type: Secondary | ICD-10-CM | POA: Diagnosis not present

## 2017-04-23 DIAGNOSIS — R7303 Prediabetes: Secondary | ICD-10-CM | POA: Diagnosis not present

## 2017-04-23 MED FILL — METHYLPHENIDATE 20 MG TAB: 20 | 30 days supply | Qty: 30 | Fill #0

## 2017-04-23 MED FILL — FLUoxetine HCL 20 MG CAPS: 20 | 30 days supply | Qty: 90 | Fill #0

## 2017-04-23 MED FILL — DICYCLOMINE 20 MG TABLET: 20 | 30 days supply | Qty: 90 | Fill #0

## 2017-04-23 MED FILL — DRYSOL DAB-O-MATIC SOLUTION: 20 | 30 days supply | Qty: 35 | Fill #0

## 2017-05-06 MED FILL — CONCERTA ER 27 MG TABLET: 27 | 30 days supply | Qty: 30 | Fill #0

## 2017-05-06 MED FILL — NORG-ETHIN ESTRA 0.25-0.035: 0.25-35 | 84 days supply | Qty: 84 | Fill #4

## 2017-05-16 MED FILL — OMEPRAZOLE DR 40 MG CAPSULE: 40 | 30 days supply | Qty: 30 | Fill #0

## 2017-05-16 MED FILL — LISINOPRIL-HCTZ 20-25 MG TA: 20-25 | 90 days supply | Qty: 90 | Fill #1

## 2017-06-03 MED FILL — CONCERTA ER 27 MG TABLET: 27 | 30 days supply | Qty: 30 | Fill #0

## 2017-06-03 MED FILL — METHYLPHENIDATE 20 MG TAB: 20 | 30 days supply | Qty: 30 | Fill #0

## 2017-06-04 MED FILL — FLUoxetine HCL 20 MG CAPS: 20 | 30 days supply | Qty: 90 | Fill #1

## 2017-06-23 ENCOUNTER — Telehealth: Payer: 59 | Admitting: Family

## 2017-06-23 DIAGNOSIS — J029 Acute pharyngitis, unspecified: Secondary | ICD-10-CM | POA: Diagnosis not present

## 2017-06-23 MED ORDER — PREDNISONE 5 MG PO TABS: 5.0000 mg | ORAL_TABLET | ORAL | 0 refills | Status: DC

## 2017-06-23 MED ORDER — BENZONATATE 100 MG PO CAPS: 100.0000 mg | ORAL_CAPSULE | Freq: Three times a day (TID) | ORAL | 0 refills | Status: DC | PRN

## 2017-06-23 MED ORDER — FLUTICASONE PROPIONATE 50 MCG/ACT NA SUSP: 2.0000 | Freq: Two times a day (BID) | NASAL | 6 refills | Status: DC

## 2017-06-23 MED FILL — FLUTICASONE PROP 50 MCG SPR: 50 | 30 days supply | Qty: 16 | Fill #0

## 2017-06-23 MED FILL — predniSONE 5 MG (21) TBPK: 5 | 6 days supply | Qty: 21 | Fill #0

## 2017-06-23 MED FILL — BENZONATATE 100 MG CAP: 100 | 5 days supply | Qty: 30 | Fill #0

## 2017-06-23 NOTE — Progress Notes (Signed)
Thank you for the details you included in the comment boxes. Those details are very helpful in determining the best course of treatment for you and help [place] to provide the best care.  We are sorry that you are not feeling well.  Here is how we plan to help!   Based on what you have shared with me it looks like you have sinusitis.  Sinusitis is inflammation and infection in the sinus cavities of the head.  Based on your presentation I believe you most likely have Acute Viral Sinusitis.This is an infection most likely caused by a virus. There is not specific treatment for viral sinusitis other than to help you with the symptoms until the infection runs its course.  You may use an oral decongestant such as Mucinex D or if you have glaucoma or high blood pressure use plain Mucinex. Saline nasal spray help and can safely be used as often as needed for congestion, I have prescribed: Fluticasone nasal spray two sprays in each nostril twice a day   I have also send a prednisone pack for your swelling and inflammation in addition to Tessalon Perles 100mg , take 1-2 every 8 hours for cough.   Some authorities believe that zinc sprays or the use of Echinacea may shorten the course of your symptoms.  Sinus infections are not as easily transmitted as other respiratory infection, however we still recommend that you avoid close contact with loved ones, especially the very young and elderly.  Remember to wash your hands thoroughly throughout the day as this is the number one way to prevent the spread of infection!  Home Care:  Only take medications as instructed by your medical team.  Complete the entire course of an antibiotic.  Do not take these medications with alcohol.  A steam or ultrasonic humidifier can help congestion.  You can place a towel over your head and breathe in the steam from hot water coming from a faucet.  Avoid close contacts especially the very young and the elderly.  Cover your mouth  when you cough or sneeze.  Always remember to wash your hands.  Get Help Right Away If:  You develop worsening fever or sinus pain.  You develop a severe head ache or visual changes.  Your symptoms persist after you have completed your treatment plan.  Make sure you  Understand these instructions.  Will watch your condition.  Will get help right away if you are not doing well or get worse.  Your e-visit answers were reviewed by a board certified advanced clinical practitioner to complete your personal care plan.  Depending on the condition, your plan could have included both over the counter or prescription medications.  If there is a problem please reply  once you have received a response from your provider.  Your safety is important to [place].  If you have drug allergies check your prescription carefully.    You can use MyChart to ask questions about today's visit, request a non-urgent call back, or ask for a work or school excuse for 24 hours related to this e-Visit. If it has been greater than 24 hours you will need to follow up with your provider, or enter a new e-Visit to address those concerns.  You will get an e-mail in the next two days asking about your experience.  I hope that your e-visit has been valuable and will speed your recovery. Thank you for using e-visits.

## 2017-07-02 MED FILL — OMEPRAZOLE DR 40 MG CAPSULE: 40 | 30 days supply | Qty: 30 | Fill #1

## 2017-07-04 MED FILL — METHYLPHENIDATE 20 MG TAB: 20 | 30 days supply | Qty: 30 | Fill #0

## 2017-07-04 MED FILL — CONCERTA ER 27 MG TABLET: 27 | 30 days supply | Qty: 30 | Fill #0

## 2017-07-06 IMAGING — CT CT ABD-PELV W/O CM
2 of 4 series · 13 of 36 positions shown, 19 images · non-contrast
Comparison: CT abdomen and pelvis of 08/15/2007

CLINICAL DATA: Severe left-sided flank pain and left lower quadrant
pain, nausea and vomiting

EXAM:
CT ABDOMEN AND PELVIS WITHOUT CONTRAST
TECHNIQUE: Multidetector CT imaging of the abdomen and pelvis was performed
following the standard protocol without IV contrast.

[Series 601: coronal body · coronal · 0.91mm/px · 1 of 133 slices shown, 2 images]
[im 45/133  soft-tissue]
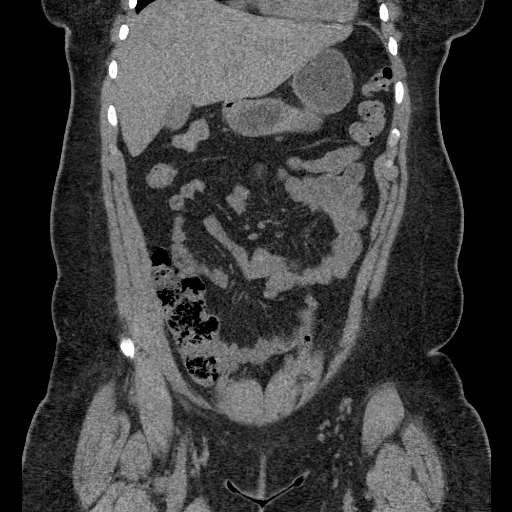
[im 45/133  bone]
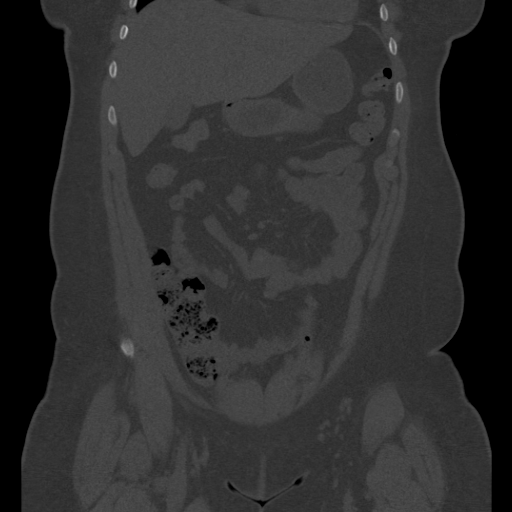

[Series 602: sagittal body · sagittal · 0.91mm/px · 12 of 179 slices shown, 17 images]
[im 11/179  soft-tissue]
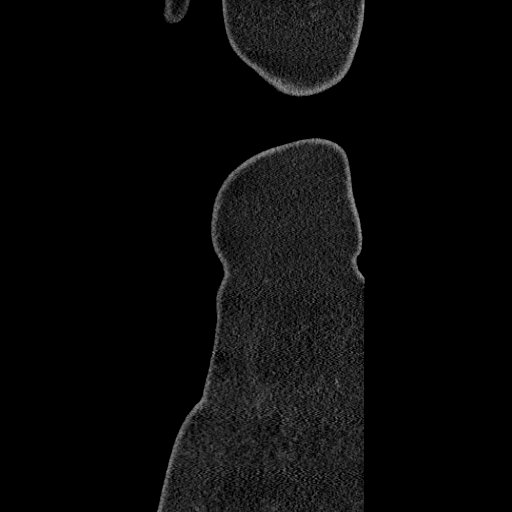
[im 11/179  lung]
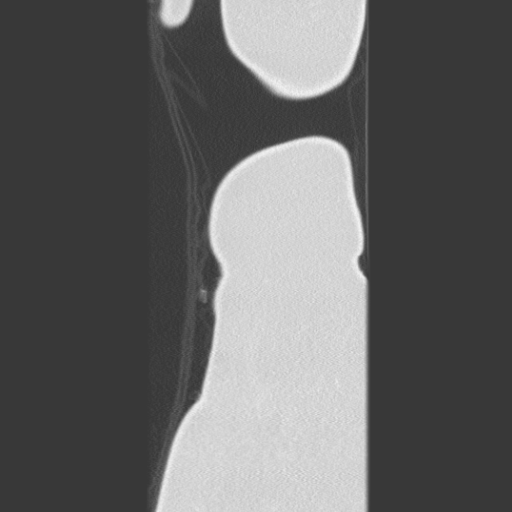
[im 11/179  bone]
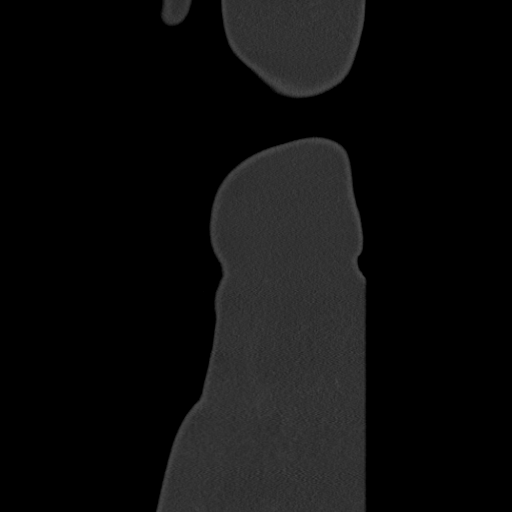
[im 21/179  lung]
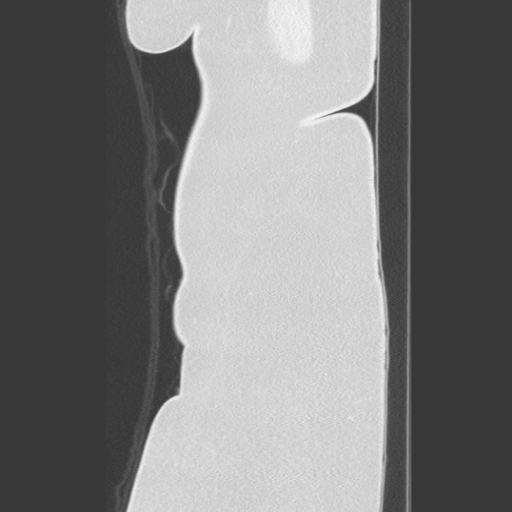
[im 32/179  soft-tissue]
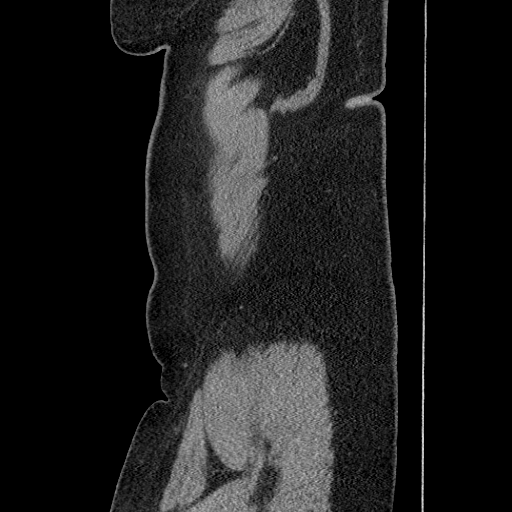
[im 32/179  lung]
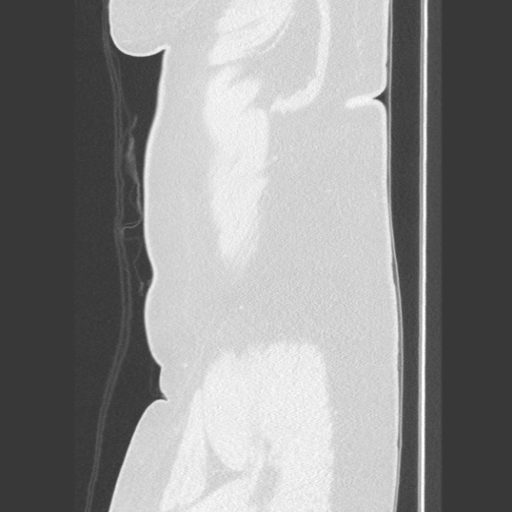
[im 42/179  soft-tissue]
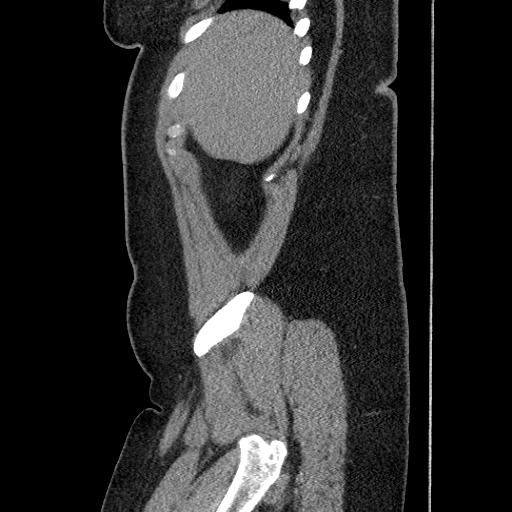
[im 42/179  lung]
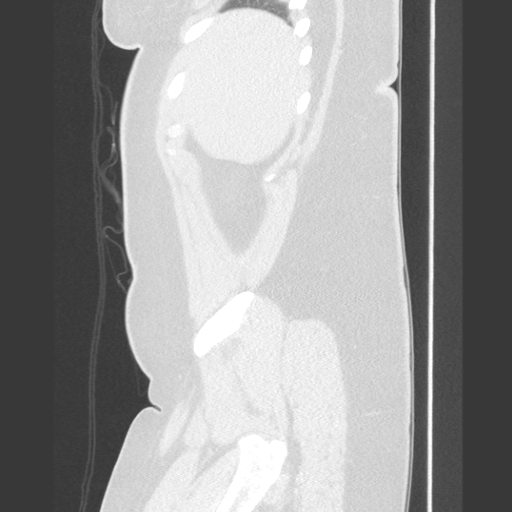
[im 63/179  soft-tissue]
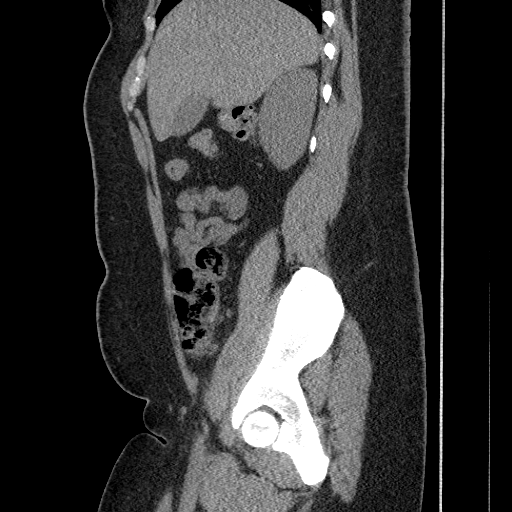
[im 74/179  soft-tissue]
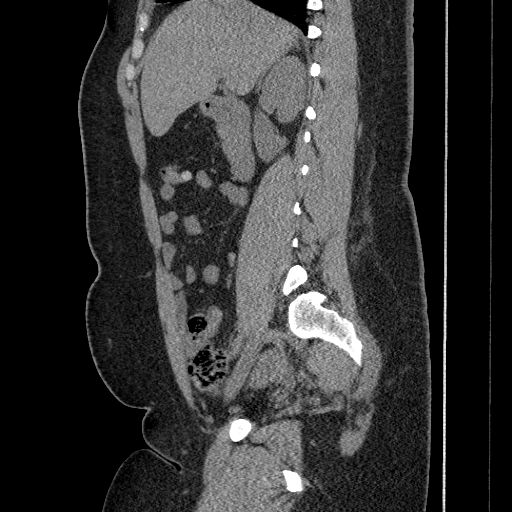
[im 95/179  soft-tissue]
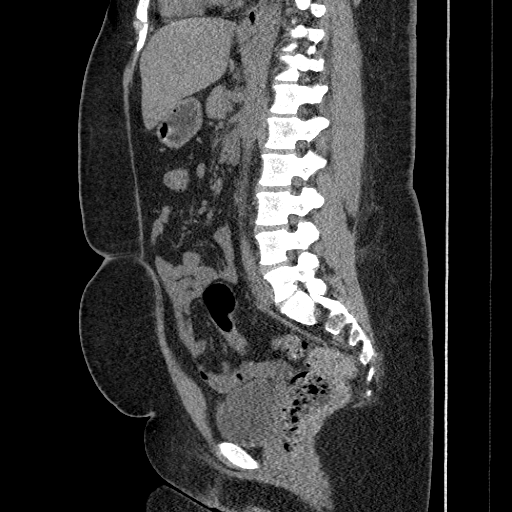
[im 105/179  soft-tissue]
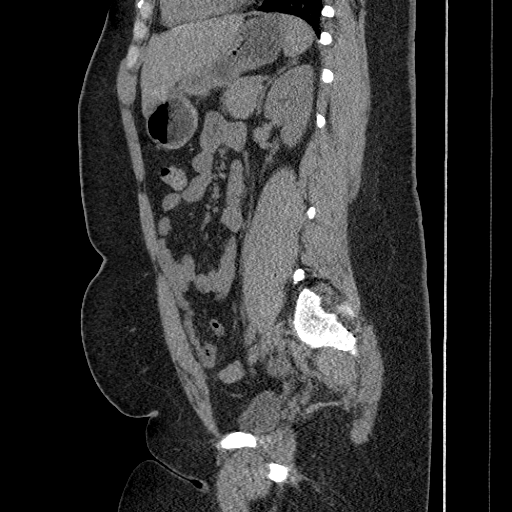
[im 116/179  soft-tissue]
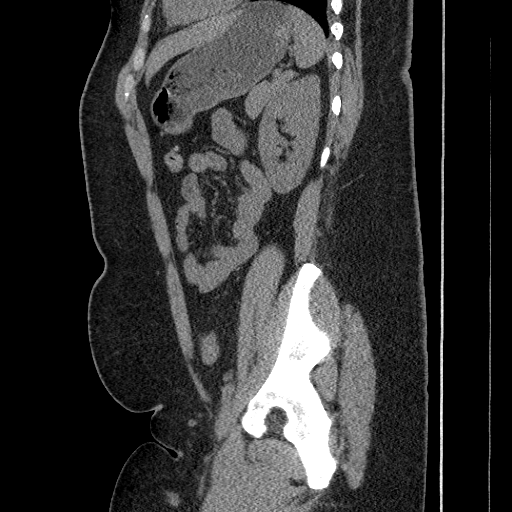
[im 137/179  soft-tissue]
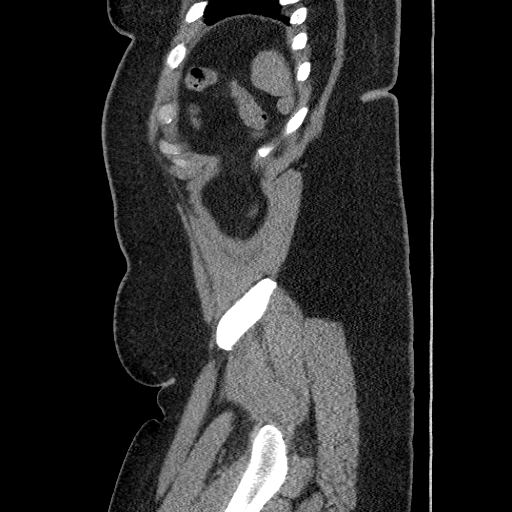
[im 137/179  bone]
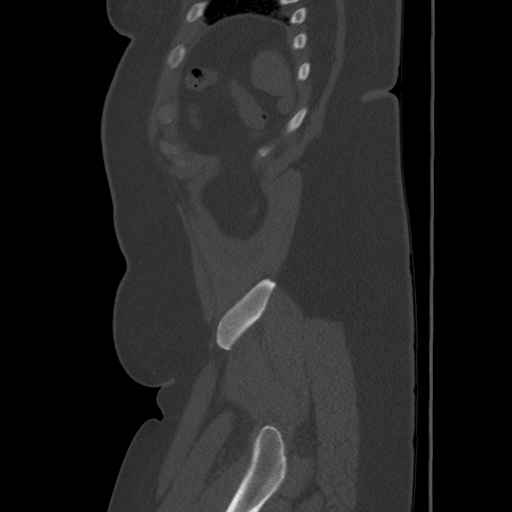
[im 147/179  soft-tissue]
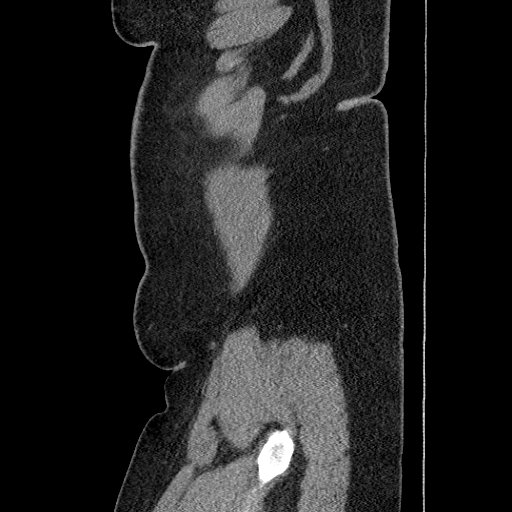
[im 168/179  soft-tissue]
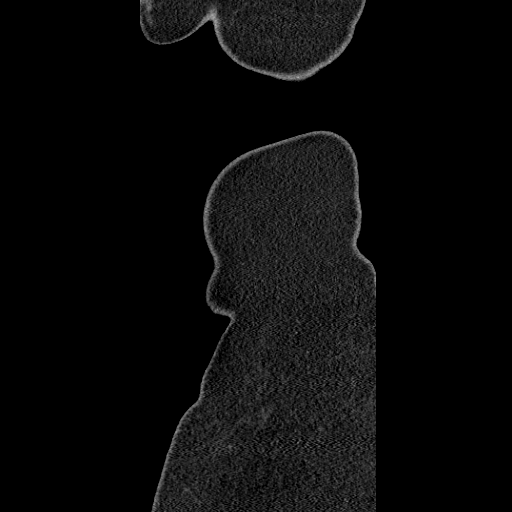

[13 of 36 positions shown; findings below may reference images not displayed]

FINDINGS: The lung bases are clear. The liver is unremarkable in the
unenhanced state. No calcified gallstones are seen. The pancreas is
normal in size and the pancreatic duct is not dilated. The adrenal
glands and spleen are unremarkable. The stomach is moderately fluid
distended with no abnormality noted.

No renal calculi are seen. There is no evidence of hydronephrosis.
Pyelonephritis cannot be assessed on this unenhanced study. The
proximal ureters are normal in caliber. The abdominal aorta is
normal in caliber.

The distal ureters are normal in caliber and no distal ureteral
calculus is seen. The urinary bladder is not well distended but no
abnormality is noted. The uterus is normal in size. The does appear
to be a left ovarian cyst of approximately 2.2 cm. No free fluid is
seen within the pelvis. There are a few scattered colon diverticula
present. No mucosal edema of the colon is seen and no small bowel
abnormality is evident. The terminal ileum is unremarkable as is the
appendix.The lumbar vertebrae are in normal alignment with normal
intervertebral disc spaces.
IMPRESSION: 1. No renal or ureteral calculi are noted.
2. Cannot assess pyelonephritis on this unenhanced CT of the abdomen
pelvis.
[DATE] cm left ovarian cyst.  No free fluid.
4. No evidence of mucosal edema of large or small bowel.

## 2017-07-07 MED FILL — FLUoxetine HCL 20 MG CAPS: 20 | 30 days supply | Qty: 90 | Fill #2

## 2017-07-28 MED FILL — NORG-ETHIN ESTRA 0.25-0.035: 0.25-35 | 84 days supply | Qty: 84 | Fill #0

## 2017-08-05 MED FILL — METHYLPHENIDATE 20 MG TAB: 20 | 30 days supply | Qty: 30 | Fill #0

## 2017-08-05 MED FILL — CONCERTA ER 27 MG TABLET: 27 | 30 days supply | Qty: 30 | Fill #0

## 2017-08-05 MED FILL — FLUoxetine HCL 20 MG CAPS: 20 | 30 days supply | Qty: 90 | Fill #3

## 2017-08-05 MED FILL — OMEPRAZOLE DR 40 MG CAPSULE: 40 | 30 days supply | Qty: 30 | Fill #2

## 2017-08-13 ENCOUNTER — Telehealth: Payer: 59 | Admitting: Family

## 2017-08-13 DIAGNOSIS — J069 Acute upper respiratory infection, unspecified: Secondary | ICD-10-CM | POA: Diagnosis not present

## 2017-08-13 MED ORDER — BENZONATATE 100 MG PO CAPS: 100.0000 mg | ORAL_CAPSULE | Freq: Three times a day (TID) | ORAL | 0 refills | Status: DC | PRN

## 2017-08-13 MED ORDER — FLUTICASONE PROPIONATE 50 MCG/ACT NA SUSP: 2.0000 | Freq: Every day | NASAL | 6 refills | Status: DC

## 2017-08-13 NOTE — Progress Notes (Signed)

## 2017-08-28 MED FILL — LISINOPRIL-HCTZ 20-25 MG TA: 20-25 | 90 days supply | Qty: 90 | Fill #2

## 2017-09-02 MED FILL — METHYLPHENIDATE 20 MG TAB: 20 | 30 days supply | Qty: 30 | Fill #0

## 2017-09-05 MED FILL — FLUoxetine HCL 20 MG CAPS: 20 | 30 days supply | Qty: 90 | Fill #4

## 2017-09-12 MED FILL — CONCERTA ER 27 MG TABLET: 27 | 30 days supply | Qty: 30 | Fill #0

## 2017-09-21 ENCOUNTER — Telehealth: Payer: 59 | Admitting: Physician Assistant

## 2017-09-21 DIAGNOSIS — J208 Acute bronchitis due to other specified organisms: Secondary | ICD-10-CM

## 2017-09-21 MED ORDER — BENZONATATE 100 MG PO CAPS: 100.0000 mg | ORAL_CAPSULE | Freq: Three times a day (TID) | ORAL | 0 refills | Status: DC | PRN

## 2017-09-21 NOTE — Progress Notes (Signed)
We are sorry that you are not feeling well.  Here is how we plan to help!  Based on your presentation I believe you most likely have A cough due to a virus.  This is called viral bronchitis and is best treated by rest, plenty of fluids and control of the cough.  You may use Ibuprofen or Tylenol as directed to help your symptoms.     In addition you may use A prescription cough medication called Tessalon Perles 100mg . You may take 1-2 capsules every 8 hours as needed for your cough.   From your responses in the eVisit questionnaire you describe inflammation in the upper respiratory tract which is causing a significant cough.  This is commonly called Bronchitis and has four common causes:    Allergies  Viral Infectio  Acid Reflux  Bacterial Infection Allergies, viruses and acid reflux are treated by controlling symptoms or eliminating the cause. An example might be a cough caused by taking certain blood pressure medications. You stop the cough by changing the medication. Another example might be a cough caused by acid reflux. Controlling the reflux helps control the cough.  USE OF BRONCHODILATOR ("RESCUE") INHALERS: There is a risk from using your bronchodilator too frequently.  The risk is that over-reliance on a medication which only relaxes the muscles surrounding the breathing tubes can reduce the effectiveness of medications prescribed to reduce swelling and congestion of the tubes themselves.  Although you feel brief relief from the bronchodilator inhaler, your asthma may actually be worsening with the tubes becoming more swollen and filled with mucus.  This can delay other crucial treatments, such as oral steroid medications. If you need to use a bronchodilator inhaler daily, several times per day, you should discuss this with your provider.  There are probably better treatments that could be used to keep your asthma under control.     HOME CARE . Only take medications as instructed by  your medical team. . Complete the entire course of an antibiotic. . Drink plenty of fluids and get plenty of rest. . Avoid close contacts especially the very young and the elderly . Cover your mouth if you cough or cough into your sleeve. . Always remember to wash your hands . A steam or ultrasonic humidifier can help congestion.   GET HELP RIGHT AWAY IF: . You develop worsening fever. . You become short of breath . You cough up blood. . Your symptoms persist after you have completed your treatment plan MAKE SURE YOU   Understand these instructions.  Will watch your condition.  Will get help right away if you are not doing well or get worse.  Your e-visit answers were reviewed by a board certified advanced clinical practitioner to complete your personal care plan.  Depending on the condition, your plan could have included both over the counter or prescription medications. If there is a problem please reply  once you have received a response from your provider. Your safety is important to [place].  If you have drug allergies check your prescription carefully.    You can use MyChart to ask questions about today's visit, request a non-urgent call back, or ask for a work or school excuse for 24 hours related to this e-Visit. If it has been greater than 24 hours you will need to follow up with your provider, or enter a new e-Visit to address those concerns. You will get an e-mail in the next two days asking about your experience.  I  hope that your e-visit has been valuable and will speed your recovery. Thank you for using e-visits.

## 2017-10-05 ENCOUNTER — Telehealth: Payer: 59 | Admitting: Physician Assistant

## 2017-10-05 DIAGNOSIS — R0602 Shortness of breath: Secondary | ICD-10-CM

## 2017-10-05 DIAGNOSIS — R6889 Other general symptoms and signs: Secondary | ICD-10-CM

## 2017-10-05 NOTE — Progress Notes (Signed)
Based on what you shared with me it looks like you have a condition that should be evaluated in a face to face office visit. You are endorsing flu like symptoms but also note shortness of breath. Giving this you need a good lung examination to make sure xray is not needed and so most appropriate treatment can be given.  NOTE: If you entered your credit card information for this eVisit, you will not be charged. You may see a "hold" on your card for the $30 but that hold will drop off and you will not have a charge processed.  If you are having a true medical emergency please call 911.  If you need an urgent face to face visit, [facility] has four urgent care centers for your convenience.  If you need care fast and have a high deductible or no insurance consider:   [email or web address] to reserve your spot online an avoid wait times  [facility] [address], [address] [place], [place] [address] 8 am to 8 pm Monday-Friday 10 am to 4 pm Saturday-Sunday *Across the street from [employer]  [place], [address] 8 am to 5 pm Monday-Friday * In the [facility] on the [facility]   The following sites will take your  insurance:  . [name] [place] a Provider at this Location  [address] [place], [place] [address] . 10 am to 8 pm Monday-Friday . 12 pm to 8 pm Saturday-Sunday   . [facility] [place] a Provider at this Location  [address], [place], [place] [address] . 8 am to 8 pm Monday-Friday . 9 am to 6 pm Saturday . 11 am to 6 pm Sunday   . [facility] [place] Get Driving Directions  [address] [place], [place] [address] . 8 am to 8 pm Monday-Friday . 8 am to 4 pm Saturday-Sunday   Your e-visit answers  were reviewed by a board certified advanced clinical practitioner to complete your personal care plan.  Thank you for using e-Visits.

## 2017-10-08 MED FILL — FLUoxetine HCL 40 MG CAPS: 40 | 90 days supply | Qty: 180 | Fill #0

## 2017-10-10 MED FILL — CONCERTA ER 27 MG TABLET: 27 | 30 days supply | Qty: 30 | Fill #0

## 2017-10-13 MED FILL — TRIAMCINOLONE ACETONIDE 0.1: 0.1 | 20 days supply | Qty: 60 | Fill #0

## 2017-10-14 MED FILL — METHYLPHENIDATE 20 MG TAB: 20 | 30 days supply | Qty: 30 | Fill #0

## 2017-11-11 MED FILL — METHYLPHENIDATE 20 MG TAB: 20 | 30 days supply | Qty: 30 | Fill #0

## 2017-11-11 MED FILL — CONCERTA ER 27 MG TABLET: 27 | 30 days supply | Qty: 30 | Fill #0

## 2017-11-12 MED FILL — FLUTICASONE PROP 50 MCG SPR: 50 | 30 days supply | Qty: 16 | Fill #1

## 2017-11-12 MED FILL — ESTARYLLA 0.25-35 MG-MCG TA: 0.25-35 | 84 days supply | Qty: 84 | Fill #1

## 2017-11-12 MED FILL — TRIAMCINOLONE 0.1% CREAM: 0.1 | 20 days supply | Qty: 60 | Fill #1

## 2017-11-19 ENCOUNTER — Emergency Department (HOSPITAL_COMMUNITY)
Admission: EM | Admit: 2017-11-19 | Discharge: 2017-11-19 | Discharge status: Against Medical Advice | Payer: 59 | Attending: Emergency Medicine | Admitting: Emergency Medicine

## 2017-11-19 DIAGNOSIS — Z041 Encounter for examination and observation following transport accident: Secondary | ICD-10-CM | POA: Diagnosis present

## 2017-11-19 DIAGNOSIS — Z5321 Procedure and treatment not carried out due to patient leaving prior to being seen by health care provider: Secondary | ICD-10-CM | POA: Diagnosis not present

## 2017-11-19 DIAGNOSIS — S299XXA Unspecified injury of thorax, initial encounter: Secondary | ICD-10-CM | POA: Diagnosis not present

## 2017-11-19 DIAGNOSIS — S279XXA Injury of unspecified intrathoracic organ, initial encounter: Secondary | ICD-10-CM | POA: Diagnosis not present

## 2017-11-19 NOTE — ED Notes (Signed)
Called for third time without response
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
## 2017-11-19 NOTE — ED Notes (Signed)
Called for triage. No response. 

## 2017-11-19 NOTE — ED Notes (Signed)
Called  No response from lobby 

## 2017-12-01 MED FILL — ONDANSETRON HCL 4 MG TABLET: 4 | 30 days supply | Qty: 20 | Fill #0

## 2017-12-01 MED FILL — PROMETHAZINE 25 MG SUPP: 25 | 3 days supply | Qty: 12 | Fill #0

## 2017-12-01 MED FILL — LISINOPRIL-HCTZ 20-25 MG TA: 20-25 | 90 days supply | Qty: 90 | Fill #0

## 2017-12-02 DIAGNOSIS — M62838 Other muscle spasm: Secondary | ICD-10-CM | POA: Diagnosis not present

## 2017-12-02 MED FILL — CYCLOBENZAPRINE 10 MG TAB: 10 | 10 days supply | Qty: 30 | Fill #0

## 2017-12-02 MED FILL — NAPROXEN 500 MG TABLET: 500 | 10 days supply | Qty: 20 | Fill #0

## 2017-12-02 MED FILL — HYDROCODON-APAP 5-325: 5-325 | 4 days supply | Qty: 20 | Fill #0

## 2017-12-02 MED FILL — predniSONE 20 MG TABS: 20 | 5 days supply | Qty: 10 | Fill #0

## 2017-12-05 ENCOUNTER — Encounter: Payer: Self-pay | Admitting: Gastroenterology

## 2017-12-10 MED FILL — METHYLPHENIDATE 20 MG TAB: 20 | 30 days supply | Qty: 30 | Fill #0

## 2017-12-10 MED FILL — CONCERTA ER 27 MG TABLET: 27 | 30 days supply | Qty: 30 | Fill #0

## 2018-01-07 ENCOUNTER — Telehealth: Payer: 59 | Admitting: Family

## 2018-01-07 DIAGNOSIS — J208 Acute bronchitis due to other specified organisms: Secondary | ICD-10-CM

## 2018-01-07 MED ORDER — BENZONATATE 100 MG PO CAPS: 100.0000 mg | ORAL_CAPSULE | Freq: Three times a day (TID) | ORAL | 0 refills | Status: DC | PRN

## 2018-01-07 MED ORDER — PREDNISONE 10 MG (21) PO TBPK: ORAL_TABLET | ORAL | 0 refills | Status: DC

## 2018-01-07 NOTE — Progress Notes (Signed)
We are sorry that you are not feeling well.  Here is how we plan to help!  Based on your presentation I believe you most likely have A cough due to a virus.  This is called viral bronchitis and is best treated by rest, plenty of fluids and control of the cough.  You may use Ibuprofen or Tylenol as directed to help your symptoms.     In addition you may use A non-prescription cough medication called Robitussin DAC. Take 2 teaspoons every 8 hours or Delsym: take 2 teaspoons every 12 hours., A non-prescription cough medication called Mucinex DM: take 2 tablets every 12 hours. and A prescription cough medication called Tessalon Perles 100mg. You may take 1-2 capsules every 8 hours as needed for your cough.  Prednisone 10 mg daily for 6 days (see taper instructions below)  Directions for 6 day taper: Day 1: 2 tablets before breakfast, 1 after both lunch & dinner and 2 at bedtime Day 2: 1 tab before breakfast, 1 after both lunch & dinner and 2 at bedtime Day 3: 1 tab at each meal & 1 at bedtime Day 4: 1 tab at breakfast, 1 at lunch, 1 at bedtime Day 5: 1 tab at breakfast & 1 tab at bedtime Day 6: 1 tab at breakfast   From your responses in the eVisit questionnaire you describe inflammation in the upper respiratory tract which is causing a significant cough.  This is commonly called Bronchitis and has four common causes:    Allergies  Viral Infections  Acid Reflux  Bacterial Infection Allergies, viruses and acid reflux are treated by controlling symptoms or eliminating the cause. An example might be a cough caused by taking certain blood pressure medications. You stop the cough by changing the medication. Another example might be a cough caused by acid reflux. Controlling the reflux helps control the cough.  USE OF BRONCHODILATOR ("RESCUE") INHALERS: There is a risk from using your bronchodilator too frequently.  The risk is that over-reliance on a medication which only relaxes the muscles  surrounding the breathing tubes can reduce the effectiveness of medications prescribed to reduce swelling and congestion of the tubes themselves.  Although you feel brief relief from the bronchodilator inhaler, your asthma may actually be worsening with the tubes becoming more swollen and filled with mucus.  This can delay other crucial treatments, such as oral steroid medications. If you need to use a bronchodilator inhaler daily, several times per day, you should discuss this with your provider.  There are probably better treatments that could be used to keep your asthma under control.     HOME CARE . Only take medications as instructed by your medical team. . Complete the entire course of an antibiotic. . Drink plenty of fluids and get plenty of rest. . Avoid close contacts especially the very young and the elderly . Cover your mouth if you cough or cough into your sleeve. . Always remember to wash your hands . A steam or ultrasonic humidifier can help congestion.   GET HELP RIGHT AWAY IF: . You develop worsening fever. . You become short of breath . You cough up blood. . Your symptoms persist after you have completed your treatment plan MAKE SURE YOU   Understand these instructions.  Will watch your condition.  Will get help right away if you are not doing well or get worse.  Your e-visit answers were reviewed by a board certified advanced clinical practitioner to complete your personal care plan.    Depending on the condition, your plan could have included both over the counter or prescription medications. If there is a problem please reply  once you have received a response from your provider. Your safety is important to us.  If you have drug allergies check your prescription carefully.    You can use MyChart to ask questions about today's visit, request a non-urgent call back, or ask for a work or school excuse for 24 hours related to this e-Visit. If it has been greater than 24  hours you will need to follow up with your provider, or enter a new e-Visit to address those concerns. You will get an e-mail in the next two days asking about your experience.  I hope that your e-visit has been valuable and will speed your recovery. Thank you for using e-visits.   

## 2018-01-08 MED FILL — FLUoxetine HCL 40 MG CAPS: 40 | 90 days supply | Qty: 180 | Fill #1

## 2018-01-14 MED FILL — CONCERTA ER 27 MG TABLET: 27 | 30 days supply | Qty: 30 | Fill #0

## 2018-01-16 MED FILL — METHYLPHENIDATE 20 MG TAB: 20 | 30 days supply | Qty: 30 | Fill #0

## 2018-02-11 MED FILL — FEMYNOR 0.25-35 MG-MCG TABS: 0.25-35 | 84 days supply | Qty: 84 | Fill #2

## 2018-02-16 MED FILL — CONCERTA ER 27 MG TABLET: 27 | 30 days supply | Qty: 30 | Fill #0

## 2018-02-16 MED FILL — METHYLPHENIDATE 20 MG TAB: 20 | 30 days supply | Qty: 30 | Fill #0

## 2018-03-09 MED FILL — LISINOPRIL-HCTZ 20-25 MG TA: 20-25 | 90 days supply | Qty: 90 | Fill #0

## 2018-03-11 ENCOUNTER — Telehealth: Payer: 59 | Admitting: Family

## 2018-03-11 DIAGNOSIS — J019 Acute sinusitis, unspecified: Secondary | ICD-10-CM | POA: Diagnosis not present

## 2018-03-11 MED ORDER — AMOXICILLIN-POT CLAVULANATE 875-125 MG PO TABS: 1.0000 | ORAL_TABLET | Freq: Two times a day (BID) | ORAL | 0 refills | Status: DC

## 2018-03-11 MED FILL — AMOX-CLAV 875-125 MG TABLET: 875-125 | 7 days supply | Qty: 14 | Fill #0

## 2018-03-11 NOTE — Progress Notes (Signed)

## 2018-03-16 MED FILL — METHYLPHENIDATE 20 MG TAB: 20 | 30 days supply | Qty: 30 | Fill #0

## 2018-03-16 MED FILL — CONCERTA ER 27 MG TABLET: 27 | 30 days supply | Qty: 30 | Fill #0

## 2018-04-20 MED FILL — CONCERTA ER 27 MG TABLET: 27 | 30 days supply | Qty: 30 | Fill #0

## 2018-04-20 MED FILL — METHYLPHENIDATE 20 MG TAB: 20 | 30 days supply | Qty: 30 | Fill #0

## 2018-04-22 MED FILL — FLUoxetine HCL 40 MG CAPS: 40 | 90 days supply | Qty: 180 | Fill #2

## 2018-04-22 MED FILL — ONDANSETRON HCL 4 MG TABLET: 4 | 30 days supply | Qty: 20 | Fill #1

## 2018-04-22 MED FILL — DICYCLOMINE 20 MG TABLET: 20 | 30 days supply | Qty: 90 | Fill #1

## 2018-05-18 MED FILL — CONCERTA ER 27 MG TABLET: 27 | 30 days supply | Qty: 30 | Fill #0

## 2018-05-18 MED FILL — METHYLPHENIDATE 20 MG TAB: 20 | 30 days supply | Qty: 30 | Fill #0

## 2018-05-24 MED FILL — FEMYNOR 0.25-35 MG-MCG TABS: 0.25-35 | 84 days supply | Qty: 84 | Fill #3

## 2018-06-29 ENCOUNTER — Encounter: Payer: Self-pay | Admitting: Physician Assistant

## 2018-06-29 ENCOUNTER — Ambulatory Visit (INDEPENDENT_AMBULATORY_CARE_PROVIDER_SITE_OTHER): Payer: 59 | Admitting: Physician Assistant

## 2018-06-29 VITALS — BP 118/80 | HR 83 | Temp 99.0°F | Ht 68.0 in | Wt 269.0 lb

## 2018-06-29 DIAGNOSIS — F988 Other specified behavioral and emotional disorders with onset usually occurring in childhood and adolescence: Secondary | ICD-10-CM

## 2018-06-29 DIAGNOSIS — F419 Anxiety disorder, unspecified: Secondary | ICD-10-CM

## 2018-06-29 DIAGNOSIS — K589 Irritable bowel syndrome without diarrhea: Secondary | ICD-10-CM | POA: Insufficient documentation

## 2018-06-29 DIAGNOSIS — E282 Polycystic ovarian syndrome: Secondary | ICD-10-CM | POA: Insufficient documentation

## 2018-06-29 DIAGNOSIS — I10 Essential (primary) hypertension: Secondary | ICD-10-CM

## 2018-06-29 DIAGNOSIS — F32A Depression, unspecified: Secondary | ICD-10-CM | POA: Insufficient documentation

## 2018-06-29 DIAGNOSIS — F329 Major depressive disorder, single episode, unspecified: Secondary | ICD-10-CM | POA: Diagnosis not present

## 2018-06-29 HISTORY — DX: Essential (primary) hypertension: I10

## 2018-06-29 MED ORDER — METHYLPHENIDATE HCL 20 MG PO TABS: 20.0000 mg | ORAL_TABLET | Freq: Every day | ORAL | 0 refills | Status: DC

## 2018-06-29 MED ORDER — FLUOXETINE HCL 40 MG PO CAPS: 80.0000 mg | ORAL_CAPSULE | Freq: Every day | ORAL | 3 refills | Status: DC

## 2018-06-29 MED ORDER — METHYLPHENIDATE HCL ER 36 MG PO TB24: 36.0000 mg | ORAL_TABLET | Freq: Every day | ORAL | 0 refills | Status: DC

## 2018-06-29 MED ORDER — LISINOPRIL-HYDROCHLOROTHIAZIDE 20-25 MG PO TABS: 1.0000 | ORAL_TABLET | Freq: Every day | ORAL | 0 refills | Status: DC

## 2018-06-29 MED FILL — CONCERTA 36 MG TABLET ER: 36 | 30 days supply | Qty: 30 | Fill #0

## 2018-06-29 MED FILL — LISINOPRIL-HCTZ 20-25 MG TA: 20-25 | 90 days supply | Qty: 90 | Fill #0

## 2018-06-29 MED FILL — METHYLPHENIDATE 20 MG TAB: 20 | 30 days supply | Qty: 30 | Fill #0

## 2018-06-29 NOTE — Patient Instructions (Signed)
It was great to see you!  Start Concerta 36 mg in the a.m.  You will be contacted about your referral to psychiatry.  Alternatively you may call Dr. Rachel Moulds at [facility] attention specialist for an appointment to see how soon they can get you in.  Let's follow-up in 3 months, sooner if you have concerns.  I would like to check your hemoglobin A1c and other labs at that time.  Take care,  Inda Coke PA-C

## 2018-06-29 NOTE — Progress Notes (Signed)
Madison Robertson is a 27 y.o. female here to Establish Care.  I acted as a Education administrator for [employer], PA-C Anselmo Pickler, LPN  History of Present Illness:   Chief Complaint  Patient presents with  . Establish Care  . Anxiety  . ADHD  . Hypertension     Anxiety, depression, ADD -- currently uncontrolled.  She states that she has been on her current regimen for about a year and a half.  She is currently on Concerta 27 mg in the a.m., Ritalin 20 mg in the p.m. and Prozac 80 mg.  She feels as though her anxiety and ADD are worsening.  She saw a psychiatrist when she was first diagnosed with ADD she was approximately elementary school aged.  Denies current suicidal or homicidal ideation.  She states that work stressors have been increasing.  States that she does take holidays from her stimulants.  HTN --she has been without her blood pressure medicine for about 2 weeks.  When she is without her blood pressure medicine she gets a migraine on average about once a week.  Currently taking Lisinopril-HCTZ 20-25 mg.  Patient denies chest pain, SOB, blurred vision, dizziness, unusual headaches, lower leg swelling. Patient is compliant with medication when she has an active prescription. Denies excessive caffeine intake, stimulant usage, excessive alcohol intake, or increase in salt consumption.  Health Maintenance: Immunizations -- UTD Caffeine intake -- once a week Sleep habits -- trouble falling asleep during periods, will sometimes take her 2-3 hours as compared to the normal 20 to 30 minutes that it may on a normal day Exercise -- none Weight -- Weight: 269 lb (122 kg)  Alcohol -- 1 drink every 3 weeks or so, no history of concerning intake  Depression screen PHQ 2/9 06/29/2018  Decreased Interest 3  Down, Depressed, Hopeless 3  PHQ - 2 Score 6  Altered sleeping 2  Tired, decreased energy 3  Change in appetite 3  Feeling bad or failure about yourself  3  Trouble concentrating 3  Moving  slowly or fidgety/restless 0  Suicidal thoughts 0  PHQ-9 Score 20  Difficult doing work/chores Very difficult    GAD 7 : Generalized Anxiety Score 06/29/2018  Nervous, Anxious, on Edge 3  Control/stop worrying 3  Worry too much - different things 3  Trouble relaxing 3  Restless 2  Easily annoyed or irritable 3  Afraid - awful might happen 3  Total GAD 7 Score 20  Anxiety Difficulty Very difficult     Other providers/specialists: Patient Care Team: Inda Coke, [place] as PCP - General (Physician Assistant) Loletha Carrow Kirke Corin, MD as Consulting Physician (Gastroenterology)   Past Medical History:  Diagnosis Date  . ADD (attention deficit disorder)   . Allergy   . Anxiety   . Asthma    childhood asthma - pt out grew  . Depression   . Heart murmur   . Hyperlipidemia    Has never been on medication for this  . Hypertension    Lisinopril-HCTZ  . Migraines    well-controlled when blood pressure is controlled  . PCOS (polycystic ovarian syndrome)    10 years; was on metformin for 2 months but didn't like the way it made her feel; has never been on spironolactone  . Pre-diabetes    Hemoglobin A1c 6.0 in September 2019     Social History   Socioeconomic History  . Marital status: Single    Spouse name: Not on file  . Number  of children: Not on file  . Years of education: Not on file  . Highest education level: Not on file  Occupational History  . Not on file  Social Needs  . Financial resource strain: Not on file  . Food insecurity:    Worry: Not on file    Inability: Not on file  . Transportation needs:    Medical: Not on file    Non-medical: Not on file  Tobacco Use  . Smoking status: Never Smoker  . Smokeless tobacco: Never Used  Substance and Sexual Activity  . Alcohol use: Yes    Comment: occassionally  . Drug use: No  . Sexual activity: Not Currently    Birth control/protection: Pill    Comment: February 25, 2017 last cycle  Lifestyle  . Physical  activity:    Days per week: Not on file    Minutes per session: Not on file  . Stress: Not on file  Relationships  . Social connections:    Talks on phone: Not on file    Gets together: Not on file    Attends religious service: Not on file    Active member of club or organization: Not on file    Attends meetings of clubs or organizations: Not on file    Relationship status: Not on file  . Intimate partner violence:    Fear of current or ex partner: Not on file    Emotionally abused: Not on file    Physically abused: Not on file    Forced sexual activity: Not on file  Other Topics Concern  . 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 WISDOM TOOTH EXTRACTION     Feb 2017    Family History  Problem Relation Age of Onset  . Diabetes Mother   . Hypertension Mother   . Colon polyps Mother   . Diabetes Father   . Hypertension Father   . Congestive Heart Failure Father   . Prostate cancer Paternal Grandfather   . Breast cancer Other        Paternal great aunt,distal cousin,   . Ovarian cancer Other        Paternal great grandmother  . Uterine cancer Other   . Colon cancer Maternal Grandfather   . Esophageal cancer Maternal Grandmother   . Esophageal cancer Maternal Uncle   . Pancreatic cancer Neg Hx   . Rectal cancer Neg Hx   . Stomach cancer Neg Hx     Allergies  Allergen Reactions  . Hydrocodone Nausea And Vomiting     Current Medications:   Current Outpatient Medications:  .  benzonatate (TESSALON PERLES) 100 MG capsule, Take 1 capsule (100 mg total) by mouth 3 (three) times daily as needed., Disp: 20 capsule, Rfl: 0 .  dicyclomine (BENTYL) 20 MG tablet, , Disp: , Rfl: 11 .  FLUoxetine (PROZAC) 40 MG capsule, Take 2 capsules (80 mg total) by mouth daily., Disp: 60 capsule, Rfl: 3 .  fluticasone (FLONASE) 50 MCG/ACT nasal spray, Place 2  sprays into both nostrils daily., Disp: 16 g, Rfl: 6 .  ibuprofen (ADVIL,MOTRIN) 200 MG tablet, Take 400 mg by mouth every 6 (six) hours as needed for fever, headache, moderate pain or cramping. , Disp: , Rfl:  .  lisinopril-hydrochlorothiazide (PRINZIDE,ZESTORETIC) 20-25 MG tablet, Take 1 tablet by mouth  daily., Disp: 90 tablet, Rfl: 0 .  methylphenidate (RITALIN) 20 MG tablet, Take 1 tablet (20 mg total) by mouth daily at 2 PM., Disp: 30 tablet, Rfl: 0 .  norgestimate-ethinyl estradiol (SPRINTEC 28) 0.25-35 MG-MCG tablet, Take 1 tablet by mouth daily., Disp: , Rfl:  .  ondansetron (ZOFRAN) 4 MG tablet, Take 1 tablet (4 mg total) by mouth every 6 (six) hours., Disp: 12 tablet, Rfl: 0 .  PROMETHEGAN 25 MG suppository, Place 25 mg rectally daily as needed for nausea or vomiting. , Disp: , Rfl: 0 .  triamcinolone cream (KENALOG) 0.1 %, Apply 1 application topically daily as needed (eczema). , Disp: , Rfl: 1 .  methylphenidate 36 MG PO CR tablet, Take 1 tablet (36 mg total) by mouth daily., Disp: 30 tablet, Rfl: 0  Current Facility-Administered Medications:  .  0.9 %  sodium chloride infusion, 500 mL, Intravenous, Continuous, Danis, Estill Cotta III, MD   Review of Systems:   ROS  Negative unless otherwise specified per HPI.  Vitals:   Vitals:   06/29/18 0926  BP: 118/80  Pulse: 83  Temp: 99 F (37.2 C)  TempSrc: Oral  Weight: 269 lb (122 kg)  Height: 5\' 8"  (1.727 m)      Body mass index is 40.9 kg/m.  Physical Exam:   Physical Exam  Constitutional: She appears well-developed. She is cooperative.  Non-toxic appearance. She does not have a sickly appearance. She does not appear ill. No distress.  Cardiovascular: Normal rate, regular rhythm, S1 normal, S2 normal, normal heart sounds and normal pulses.  No LE edema  Pulmonary/Chest: Effort normal and breath sounds normal.  Neurological: She is alert. GCS eye subscore is 4. GCS verbal subscore is 5. GCS motor subscore is 6.  Skin: Skin  is warm, dry and intact.  Psychiatric: She has a normal mood and affect. Her speech is normal and behavior is normal.  Nursing note and vitals reviewed.   Assessment and Plan:   High blood pressure Currently well controlled.  Refill medication today and encourage compliance.  Follow-up in 3 months.  ADD (attention deficit disorder) Uncontrolled.  We are going to increase her Concerta from 27 to 36 mg in the morning we will maintain the Ritalin dosage.  I do think that her anxiety may also be at play here, she is agreeable to going to psychiatry for further evaluation and treatment.  I have put the referral in and have only given her enough refill of these medications for 30 days.  Anxiety We did alter her ADD medications today, however I do feel as though her anxiety is also uncontrolled.  We are going to refer to psychiatry for further evaluation and treatment.  Depression Uncontrolled, no SI or HI today.  I discussed with patient that if they develop any SI, to tell someone immediately and seek medical attention.  Follow-up with psychiatry   . Reviewed expectations re: course of current medical issues. . Discussed self-management of symptoms. . Outlined signs and symptoms indicating need for more acute intervention. . Patient verbalized understanding and all questions were answered. . See orders for this visit as documented in the electronic medical record. . Patient received an After-Visit Summary.  CMA or LPN served as scribe during this visit. History, Physical, and Plan performed by medical provider. The above documentation has been reviewed and is accurate and complete.   Inda Coke, PA-C

## 2018-06-29 NOTE — Assessment & Plan Note (Signed)
Uncontrolled.  We are going to increase her Concerta from 27 to 36 mg in the morning we will maintain the Ritalin dosage.  I do think that her anxiety may also be at play here, she is agreeable to going to psychiatry for further evaluation and treatment.  I have put the referral in and have only given her enough refill of these medications for 30 days.

## 2018-06-29 NOTE — Assessment & Plan Note (Signed)
We did alter her ADD medications today, however I do feel as though her anxiety is also uncontrolled.  We are going to refer to psychiatry for further evaluation and treatment.

## 2018-06-29 NOTE — Assessment & Plan Note (Addendum)
Currently well controlled.  Refill medication today and encourage compliance.  Follow-up in 3 months.

## 2018-06-29 NOTE — Assessment & Plan Note (Signed)
Uncontrolled, no SI or HI today.  I discussed with patient that if they develop any SI, to tell someone immediately and seek medical attention.  Follow-up with psychiatry

## 2018-07-23 ENCOUNTER — Other Ambulatory Visit: Payer: Self-pay | Admitting: Physician Assistant

## 2018-07-23 NOTE — Telephone Encounter (Signed)
pls see message-  Aldona Bar out of office until 12/30.

## 2018-07-23 NOTE — Telephone Encounter (Signed)
Called and left detailed voicemail message on patient's cell phone.  Too soon to fill both rx's for Concerta and Ritalin.  Aldona Bar out of office and will return on 12/30.  Pt will need to call back next week for refill.

## 2018-07-23 NOTE — Telephone Encounter (Signed)
Please let her know she is requesting these too early. Last filled on 06/29/2018. Has not bee 30 days. Can fill on Monday.  Orma Flaming, MD [place]

## 2018-07-27 ENCOUNTER — Other Ambulatory Visit: Payer: Self-pay | Admitting: Physician Assistant

## 2018-07-27 MED ORDER — METHYLPHENIDATE HCL ER 36 MG PO TB24: 36.0000 mg | ORAL_TABLET | ORAL | 0 refills | Status: DC

## 2018-07-27 MED ORDER — METHYLPHENIDATE HCL 20 MG PO TABS: 20.0000 mg | ORAL_TABLET | Freq: Every day | ORAL | 0 refills | Status: DC

## 2018-07-27 MED FILL — CONCERTA 36 MG TABLET ER: 36 | 30 days supply | Qty: 30 | Fill #0

## 2018-07-27 NOTE — Telephone Encounter (Signed)
I have filled medications to start 07/29/18.  Madison Robertson

## 2018-07-29 ENCOUNTER — Other Ambulatory Visit: Payer: Self-pay | Admitting: Physician Assistant

## 2018-07-30 NOTE — Telephone Encounter (Signed)
Last OV 06/29/18 Last refill 20 mg 07/29/18 Last refill 30 mg 07/17/18 Next OV 09/28/2018  Forwarding to [employer]

## 2018-07-30 NOTE — Telephone Encounter (Signed)
Pt requesting refills.

## 2018-07-31 MED ORDER — METHYLPHENIDATE HCL 20 MG PO TABS: 20.0000 mg | ORAL_TABLET | Freq: Every day | ORAL | 0 refills | Status: DC

## 2018-07-31 MED ORDER — METHYLPHENIDATE HCL ER 36 MG PO TB24: 36.0000 mg | ORAL_TABLET | ORAL | 0 refills | Status: DC

## 2018-07-31 MED FILL — METHYLPHENIDATE 20 MG TAB: 20 | 30 days supply | Qty: 30 | Fill #0

## 2018-08-03 MED FILL — TRIAMCINOLONE 0.1% CREAM: 0.1 | 20 days supply | Qty: 60 | Fill #2

## 2018-08-03 MED FILL — FLUoxetine HCL 40 MG CAPS: 40 | 90 days supply | Qty: 180 | Fill #3

## 2018-08-16 ENCOUNTER — Encounter: Payer: Self-pay | Admitting: Physician Assistant

## 2018-08-17 MED ORDER — NORGESTIMATE-ETH ESTRADIOL 0.25-35 MG-MCG PO TABS: 1.0000 | ORAL_TABLET | Freq: Every day | ORAL | 2 refills | Status: DC

## 2018-09-02 ENCOUNTER — Encounter: Payer: Self-pay | Admitting: Physician Assistant

## 2018-09-02 ENCOUNTER — Other Ambulatory Visit: Payer: Self-pay | Admitting: Physician Assistant

## 2018-09-03 ENCOUNTER — Ambulatory Visit (INDEPENDENT_AMBULATORY_CARE_PROVIDER_SITE_OTHER): Payer: No Typology Code available for payment source | Admitting: Internal Medicine

## 2018-09-03 DIAGNOSIS — Z20828 Contact with and (suspected) exposure to other viral communicable diseases: Secondary | ICD-10-CM | POA: Diagnosis not present

## 2018-09-03 LAB — POCT INFLUENZA A/B
Influenza A, POC: NEGATIVE
Influenza B, POC: NEGATIVE

## 2018-09-03 MED ORDER — METHYLPHENIDATE HCL 20 MG PO TABS: 20.0000 mg | ORAL_TABLET | Freq: Every day | ORAL | 0 refills | Status: DC

## 2018-09-03 MED ORDER — METHYLPHENIDATE HCL ER 36 MG PO TB24: 36.0000 mg | ORAL_TABLET | ORAL | 0 refills | Status: DC

## 2018-09-03 MED FILL — CONCERTA 36 MG TABLET ER: 36 | 30 days supply | Qty: 30 | Fill #0

## 2018-09-03 MED FILL — METHYLPHENIDATE 20 MG TAB: 20 | 30 days supply | Qty: 30 | Fill #0

## 2018-09-03 NOTE — Telephone Encounter (Signed)
Pt requesting refills. PMP checked see below.  Controlled substance: Methylphenidate (Concerta) 36 mg and Methylphenidate (Ritalin) 20 mg  Pharmacy: [facility]   Status: Rx for Concerta last filled 02/98/4730, Ritalin last filled 07/31/2018 both 30 day supply  New Findings: None   Additional Comments: Patient has an appointment scheduled for March 2nd.

## 2018-09-03 NOTE — Progress Notes (Signed)
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  Minimal nonspecific symptoms- rhinitis.  Did have flu shot. Influenza nasal swab-negative.  Observation only.  Consider Tamiflu prophylaxis but not likely necessary.

## 2018-09-07 MED FILL — FEMYNOR 0.25-35 MG-MCG TABS: 0.25-35 | 84 days supply | Qty: 84 | Fill #0

## 2018-09-15 MED FILL — PROMETHAZINE HCL 25 MG SUPP: 25 | 3 days supply | Qty: 12 | Fill #1

## 2018-09-15 MED FILL — VYVANSE 20 MG CAPSULE: 20 | 30 days supply | Qty: 30 | Fill #0

## 2018-09-22 ENCOUNTER — Encounter: Payer: Self-pay | Admitting: Physician Assistant

## 2018-09-28 ENCOUNTER — Ambulatory Visit: Payer: 59 | Admitting: Physician Assistant

## 2018-10-07 ENCOUNTER — Other Ambulatory Visit: Payer: Self-pay | Admitting: Physician Assistant

## 2018-10-08 MED ORDER — LISINOPRIL-HYDROCHLOROTHIAZIDE 20-25 MG PO TABS: 1.0000 | ORAL_TABLET | Freq: Every day | ORAL | 0 refills | Status: DC

## 2018-10-08 MED FILL — LISINOPRIL-HCTZ 20-25 MG TA: 20-25 | 90 days supply | Qty: 90 | Fill #0

## 2018-10-12 MED FILL — VYVANSE 20 MG CAPSULE: 20 | 30 days supply | Qty: 30 | Fill #0

## 2018-10-23 ENCOUNTER — Telehealth: Payer: Self-pay | Admitting: *Deleted

## 2018-10-23 NOTE — Telephone Encounter (Signed)
Tried to contact pt, unable to leave message voicemail box is full.

## 2018-10-28 ENCOUNTER — Telehealth: Payer: Self-pay | Admitting: *Deleted

## 2018-10-29 ENCOUNTER — Ambulatory Visit (INDEPENDENT_AMBULATORY_CARE_PROVIDER_SITE_OTHER): Payer: No Typology Code available for payment source | Admitting: Physician Assistant

## 2018-10-29 ENCOUNTER — Encounter: Payer: Self-pay | Admitting: Physician Assistant

## 2018-10-29 VITALS — BP 114/78

## 2018-10-29 DIAGNOSIS — I10 Essential (primary) hypertension: Secondary | ICD-10-CM

## 2018-10-29 DIAGNOSIS — E669 Obesity, unspecified: Secondary | ICD-10-CM | POA: Diagnosis not present

## 2018-10-29 NOTE — Telephone Encounter (Signed)
Error

## 2018-10-29 NOTE — Progress Notes (Signed)
Virtual Visit via Video   I connected with Madison Robertson on 10/29/18 at 10:40 AM EDT by a video enabled telemedicine application and verified that I am speaking with the correct person using two identifiers. Location patient: Home Location provider: [facility], Office Persons participating in the virtual visit: Madison Robertson, [place], PA, Madison Robertson, Madison Robertson.  I discussed the limitations of evaluation and management by telemedicine and the availability of in person appointments. The patient expressed understanding and agreed to proceed.  Subjective:   HPI:  Hypertension Currently taking Lisinopril-HCTZ 20-25 mg. At home blood pressure readings are: <120/80. Patient denies chest pain, SOB, blurred vision, dizziness, unusual headaches, lower leg swelling. Patient is compliant with medication. Denies excessive caffeine intake, stimulant usage, excessive alcohol intake, or increase in salt consumption.  She is on sprintec OCP.  She is interested in rechecking her HgbA1c. It was elevated in the past. It has not been checked in about 3 years. She tries to eat well, minimal exercise.  Wt Readings from Last 5 Encounters:  06/29/18 269 lb (122 kg)  03/10/17 262 lb (118.8 kg)  02/25/17 262 lb 12.8 oz (119.2 kg)  05/02/16 245 lb (111.1 kg)  05/19/14 257 lb 3.2 oz (116.7 kg)     ROS: See pertinent positives and negatives per HPI.  Patient Active Problem List   Diagnosis Date Noted  . ADD (attention deficit disorder) 06/29/2018  . Anxiety 06/29/2018  . Depression 06/29/2018  . High blood pressure 06/29/2018  . IBS (irritable bowel syndrome) 06/29/2018  . PCOS (polycystic ovarian syndrome) 06/29/2018    Social History   Tobacco Use  . Smoking status: Never Smoker  . Smokeless tobacco: Never Used  Substance Use Topics  . Alcohol use: Yes    Comment: occassionally    Current Outpatient Medications:  .  benzonatate (TESSALON PERLES) 100 MG capsule, Take 1 capsule  (100 mg total) by mouth 3 (three) times daily as needed., Disp: 20 capsule, Rfl: 0 .  dicyclomine (BENTYL) 20 MG tablet, , Disp: , Rfl: 11 .  fluticasone (FLONASE) 50 MCG/ACT nasal spray, Place 2 sprays into both nostrils daily., Disp: 16 g, Rfl: 6 .  ibuprofen (ADVIL,MOTRIN) 200 MG tablet, Take 400 mg by mouth every 6 (six) hours as needed for fever, headache, moderate pain or cramping. , Disp: , Rfl:  .  lisinopril-hydrochlorothiazide (PRINZIDE,ZESTORETIC) 20-25 MG tablet, Take 1 tablet by mouth daily., Disp: 90 tablet, Rfl: 0 .  norgestimate-ethinyl estradiol (SPRINTEC 28) 0.25-35 MG-MCG tablet, Take 1 tablet by mouth daily., Disp: 1 Package, Rfl: 2 .  ondansetron (ZOFRAN) 4 MG tablet, Take 1 tablet (4 mg total) by mouth every 6 (six) hours., Disp: 12 tablet, Rfl: 0 .  PROMETHEGAN 25 MG suppository, Place 25 mg rectally daily as needed for nausea or vomiting. , Disp: , Rfl: 0 .  triamcinolone cream (KENALOG) 0.1 %, Apply 1 application topically daily as needed (eczema). , Disp: , Rfl: 1 .  VYVANSE 20 MG capsule, , Disp: , Rfl:   Current Facility-Administered Medications:  .  0.9 %  sodium chloride infusion, 500 mL, Intravenous, Continuous, Danis, Kirke Corin, MD  Allergies  Allergen Reactions  . Hydrocodone Nausea And Vomiting    Objective:   VITALS: Per patient if applicable, see vitals. GENERAL: Alert, appears well and in no acute distress. HEENT: Atraumatic, conjunctiva clear, no obvious abnormalities on inspection of external nose and ears. NECK: Normal movements of the head and neck. CARDIOPULMONARY: No increased WOB.  Speaking in clear sentences. I:E ratio WNL.  MS: Moves all visible extremities without noticeable abnormality. PSYCH: Pleasant and cooperative, well-groomed. Speech normal rate and rhythm. Affect is appropriate. Insight and judgement are appropriate. Attention is focused, linear, and appropriate.  NEURO: CN grossly intact. Oriented as arrived to appointment on time  with no prompting. Moves both UE equally.  SKIN: No obvious lesions, wounds, erythema, or cyanosis noted on face or hands.  Assessment and Plan:   [name] was seen today for hypertension.  Diagnoses and all orders for this visit:  Essential hypertension Refill medication today. No recent BMP, will check labs.  -     Basic metabolic panel; Future  Obesity, unspecified classification, unspecified obesity type, unspecified whether serious comorbidity present Recheck HgbA1c today, does have hx PCOS. -     Hemoglobin A1c; Future   . Reviewed expectations re: course of current medical issues. . Discussed self-management of symptoms. . Outlined signs and symptoms indicating need for more acute intervention. . Patient verbalized understanding and all questions were answered. Marland Kitchen Health Maintenance issues including appropriate healthy diet, exercise, and smoking avoidance were discussed with patient. . See orders for this visit as documented in the electronic medical record.  [place], [place] 10/29/2018

## 2018-10-30 ENCOUNTER — Other Ambulatory Visit (INDEPENDENT_AMBULATORY_CARE_PROVIDER_SITE_OTHER): Payer: No Typology Code available for payment source

## 2018-10-30 DIAGNOSIS — I10 Essential (primary) hypertension: Secondary | ICD-10-CM

## 2018-10-30 DIAGNOSIS — E669 Obesity, unspecified: Secondary | ICD-10-CM | POA: Diagnosis not present

## 2018-10-30 LAB — BASIC METABOLIC PANEL
BUN: 8 mg/dL (ref 6–23)
CO2: 29 mEq/L (ref 19–32)
Calcium: 9.1 mg/dL (ref 8.4–10.5)
Chloride: 100 mEq/L (ref 96–112)
Creatinine, Ser: 0.74 mg/dL (ref 0.40–1.20)
GFR: 111.6 mL/min (ref >60.00)
Glucose, Bld: 118 mg/dL — ABNORMAL HIGH (ref 70–99)
Potassium: 3.1 mEq/L — ABNORMAL LOW (ref 3.5–5.1)
Sodium: 138 mEq/L (ref 135–145)

## 2018-10-30 LAB — HEMOGLOBIN A1C: Hgb A1c MFr Bld: 6.1 % (ref 4.6–6.5)

## 2018-11-02 ENCOUNTER — Other Ambulatory Visit: Payer: Self-pay | Admitting: Physician Assistant

## 2018-11-02 ENCOUNTER — Encounter: Payer: Self-pay | Admitting: Physician Assistant

## 2018-11-02 MED ORDER — AMLODIPINE BESYLATE 10 MG PO TABS: 10.0000 mg | ORAL_TABLET | Freq: Every day | ORAL | 0 refills | Status: DC

## 2018-11-02 MED ORDER — METFORMIN HCL ER 500 MG PO TB24: 500.0000 mg | ORAL_TABLET | Freq: Every day | ORAL | 0 refills | Status: DC

## 2018-11-02 MED FILL — metFORMIN HCL ER 500 MG TB2: 500 | 90 days supply | Qty: 90 | Fill #0

## 2018-11-02 MED FILL — AMLODIPINE BESYLATE 10 MG T: 10 | 90 days supply | Qty: 90 | Fill #0

## 2018-11-11 ENCOUNTER — Encounter: Payer: Self-pay | Admitting: Physician Assistant

## 2018-11-11 MED ORDER — TRIAMCINOLONE ACETONIDE 0.1 % EX CREA: 1.0000 "application " | TOPICAL_CREAM | Freq: Every day | CUTANEOUS | 1 refills | Status: DC | PRN

## 2018-11-11 MED ORDER — ALUMINUM CHLORIDE 20 % EX SOLN: Freq: Every day | CUTANEOUS | 1 refills | Status: DC

## 2018-11-11 MED ORDER — NORGESTIMATE-ETH ESTRADIOL 0.25-35 MG-MCG PO TABS: 1.0000 | ORAL_TABLET | Freq: Every day | ORAL | 5 refills | Status: DC

## 2018-11-11 MED FILL — FEMYNOR 0.25-35 MG-MCG TABS: 0.25-35 | 84 days supply | Qty: 84 | Fill #0

## 2018-11-11 MED FILL — DRYSOL DAB-O-MATIC SOLUTION: 20 | 30 days supply | Qty: 35 | Fill #0

## 2018-11-11 MED FILL — TRIAMCINOLONE 0.1% CREAM: 0.1 | 30 days supply | Qty: 30 | Fill #0

## 2018-11-12 MED FILL — VYVANSE 20 MG CAPSULE: 20 | 90 days supply | Qty: 90 | Fill #0

## 2018-11-15 ENCOUNTER — Encounter: Payer: Self-pay | Admitting: Physician Assistant

## 2018-11-16 ENCOUNTER — Encounter: Payer: Self-pay | Admitting: Physician Assistant

## 2018-11-16 ENCOUNTER — Ambulatory Visit (INDEPENDENT_AMBULATORY_CARE_PROVIDER_SITE_OTHER): Payer: No Typology Code available for payment source | Admitting: Physician Assistant

## 2018-11-16 ENCOUNTER — Other Ambulatory Visit: Payer: Self-pay

## 2018-11-16 DIAGNOSIS — E876 Hypokalemia: Secondary | ICD-10-CM

## 2018-11-16 DIAGNOSIS — R600 Localized edema: Secondary | ICD-10-CM

## 2018-11-16 MED ORDER — POTASSIUM CHLORIDE CRYS ER 20 MEQ PO TBCR: EXTENDED_RELEASE_TABLET | ORAL | 0 refills | Status: DC

## 2018-11-16 MED ORDER — LISINOPRIL 20 MG PO TABS: 20.0000 mg | ORAL_TABLET | Freq: Every day | ORAL | 1 refills | Status: DC

## 2018-11-16 MED ORDER — FUROSEMIDE 20 MG PO TABS: 20.0000 mg | ORAL_TABLET | Freq: Every day | ORAL | 1 refills | Status: DC | PRN

## 2018-11-16 MED FILL — POTASSIUM CHLORIDE CRYS ER: 20 | 30 days supply | Qty: 30 | Fill #0

## 2018-11-16 MED FILL — FUROSEMIDE 20 MG TABS: 20 | 30 days supply | Qty: 30 | Fill #0

## 2018-11-16 MED FILL — LISINOPRIL 20 MG TABLET: 20 | 30 days supply | Qty: 30 | Fill #0

## 2018-11-16 NOTE — Progress Notes (Signed)
Virtual Visit via Video   I connected with Madison Robertson on 11/16/18 at  2:20 PM EDT by a video enabled telemedicine application and verified that I am speaking with the correct person using two identifiers. Location patient: Home Location provider:  [facility], Office Persons participating in the virtual visit: [place], [place], [place]   I discussed the limitations of evaluation and management by telemedicine and the availability of in person appointments. The patient expressed understanding and agreed to proceed.  Subjective:   HPI:  Lower extremity edema Pt c/o LE edema since she was taken off Lisinopril/HCTZ, she has noticed that since starting Norvasc 10 mg, she has been having fluid build up in her legs and ankles. They are swollen after work and even on the weekends. Denies SOB. Pt took one of her mother's Furosemide 20 mg tablet and the swelling went done significantly.  Patient denies chest pain, SOB, blurred vision, dizziness, unusual headaches. Denies excessive caffeine intake, stimulant usage, excessive alcohol intake, or increase in salt consumption.    ROS: See pertinent positives and negatives per HPI.  Patient Active Problem List   Diagnosis Date Noted  . ADD (attention deficit disorder) 06/29/2018  . Anxiety 06/29/2018  . Depression 06/29/2018  . High blood pressure 06/29/2018  . IBS (irritable bowel syndrome) 06/29/2018  . PCOS (polycystic ovarian syndrome) 06/29/2018    Social History   Tobacco Use  . Smoking status: Never Smoker  . Smokeless tobacco: Never Used  Substance Use Topics  . Alcohol use: Yes    Comment: occassionally    Current Outpatient Medications:  .  aluminum chloride (DRYSOL) 20 % external solution, Apply topically at bedtime., Disp: 35 mL, Rfl: 1 .  dicyclomine (BENTYL) 20 MG tablet, , Disp: , Rfl: 11 .  fluticasone (FLONASE) 50 MCG/ACT nasal spray, Place 2 sprays into both nostrils daily., Disp: 16 g, Rfl: 6 .   ibuprofen (ADVIL,MOTRIN) 200 MG tablet, Take 400 mg by mouth every 6 (six) hours as needed for fever, headache, moderate pain or cramping. , Disp: , Rfl:  .  metFORMIN (GLUCOPHAGE XR) 500 MG 24 hr tablet, Take 1 tablet (500 mg total) by mouth daily with breakfast., Disp: 90 tablet, Rfl: 0 .  norgestimate-ethinyl estradiol (SPRINTEC 28) 0.25-35 MG-MCG tablet, Take 1 tablet by mouth daily., Disp: 1 Package, Rfl: 5 .  ondansetron (ZOFRAN) 4 MG tablet, Take 1 tablet (4 mg total) by mouth every 6 (six) hours., Disp: 12 tablet, Rfl: 0 .  PROMETHEGAN 25 MG suppository, Place 25 mg rectally daily as needed for nausea or vomiting. , Disp: , Rfl: 0 .  triamcinolone cream (KENALOG) 0.1 %, Apply 1 application topically daily as needed (eczema)., Disp: 30 g, Rfl: 1 .  VYVANSE 20 MG capsule, , Disp: , Rfl:  .  furosemide (LASIX) 20 MG tablet, Take 1 tablet (20 mg total) by mouth daily as needed for fluid., Disp: 30 tablet, Rfl: 1 .  lisinopril (ZESTRIL) 20 MG tablet, Take 1 tablet (20 mg total) by mouth daily., Disp: 30 tablet, Rfl: 1 .  potassium chloride SA (K-DUR) 20 MEQ tablet, Take 1 tablet every time you take a lasix tablet., Disp: 30 tablet, Rfl: 0  Current Facility-Administered Medications:  .  0.9 %  sodium chloride infusion, 500 mL, Intravenous, Continuous, Danis, Kirke Corin, MD  Allergies  Allergen Reactions  . Hydrocodone Nausea And Vomiting    Objective:   VITALS: Per patient if applicable, see vitals. GENERAL: Alert, appears  well and in no acute distress. HEENT: Atraumatic, conjunctiva clear, no obvious abnormalities on inspection of external nose and ears. NECK: Normal movements of the head and neck. CARDIOPULMONARY: No increased WOB. Speaking in clear sentences. I:E ratio WNL.  MS: Moves all visible extremities without noticeable abnormality. PSYCH: Pleasant and cooperative, well-groomed. Speech normal rate and rhythm. Affect is appropriate. Insight and judgement are appropriate.  Attention is focused, linear, and appropriate.  NEURO: CN grossly intact. Oriented as arrived to appointment on time with no prompting. Moves both UE equally.  SKIN: No obvious lesions, wounds, erythema, or cyanosis noted on face or hands.  Assessment and Plan:   Madison Robertson was seen today for leg swelling.  Diagnoses and all orders for this visit:  Hypokalemia; Leg edema Recheck BMP today. Will stop Norvasc as LE edema could be side effect of this medication. Re-start Lisinopril 20 mg. Start Lasix 20 mg daily prn for swelling. Will have patient take potassium supplement whenever she takes Lasix to prevent worsening hypokalemia. Follow-up in 1-2 weeks, sooner if concerns/issues. -     Basic metabolic panel; Future    Other orders -     lisinopril (ZESTRIL) 20 MG tablet; Take 1 tablet (20 mg total) by mouth daily. -     furosemide (LASIX) 20 MG tablet; Take 1 tablet (20 mg total) by mouth daily as needed for fluid. -     potassium chloride SA (K-DUR) 20 MEQ tablet; Take 1 tablet every time you take a lasix tablet.    . Reviewed expectations re: course of current medical issues. . Discussed self-management of symptoms. . Outlined signs and symptoms indicating need for more acute intervention. . Patient verbalized understanding and all questions were answered. Marland Kitchen Health Maintenance issues including appropriate healthy diet, exercise, and smoking avoidance were discussed with patient. . See orders for this visit as documented in the electronic medical record.  I discussed the assessment and treatment plan with the patient. The patient was provided an opportunity to ask questions and all were answered. The patient agreed with the plan and demonstrated an understanding of the instructions.   The patient was advised to call back or seek an in-person evaluation if the symptoms worsen or if the condition fails to improve as anticipated.  CMA or LPN served as scribe during this visit. History,  Physical, and Plan performed by medical provider. The above documentation has been reviewed and is accurate and complete.    [place], [place] 11/16/2018

## 2019-01-15 ENCOUNTER — Encounter: Payer: Self-pay | Admitting: Physician Assistant

## 2019-01-15 ENCOUNTER — Other Ambulatory Visit: Payer: Self-pay | Admitting: Physician Assistant

## 2019-01-15 MED ORDER — DICYCLOMINE HCL 20 MG PO TABS: 20.0000 mg | ORAL_TABLET | Freq: Three times a day (TID) | ORAL | 1 refills | Status: DC | PRN

## 2019-01-15 MED ORDER — ONDANSETRON HCL 4 MG PO TABS: 4.0000 mg | ORAL_TABLET | Freq: Four times a day (QID) | ORAL | 0 refills | Status: DC

## 2019-01-25 ENCOUNTER — Other Ambulatory Visit: Payer: Self-pay | Admitting: Physician Assistant

## 2019-01-25 ENCOUNTER — Ambulatory Visit (INDEPENDENT_AMBULATORY_CARE_PROVIDER_SITE_OTHER): Payer: No Typology Code available for payment source | Admitting: Bariatrics

## 2019-01-25 ENCOUNTER — Other Ambulatory Visit: Payer: Self-pay

## 2019-01-25 ENCOUNTER — Encounter (INDEPENDENT_AMBULATORY_CARE_PROVIDER_SITE_OTHER): Payer: Self-pay | Admitting: Bariatrics

## 2019-01-25 ENCOUNTER — Encounter: Payer: Self-pay | Admitting: Physician Assistant

## 2019-01-25 ENCOUNTER — Ambulatory Visit (INDEPENDENT_AMBULATORY_CARE_PROVIDER_SITE_OTHER): Payer: Self-pay | Admitting: Bariatrics

## 2019-01-25 VITALS — BP 112/68 | HR 108 | Temp 98.7°F | Ht 68.0 in | Wt 272.0 lb

## 2019-01-25 DIAGNOSIS — R5383 Other fatigue: Secondary | ICD-10-CM | POA: Diagnosis not present

## 2019-01-25 DIAGNOSIS — Z1331 Encounter for screening for depression: Secondary | ICD-10-CM | POA: Diagnosis not present

## 2019-01-25 DIAGNOSIS — R0602 Shortness of breath: Secondary | ICD-10-CM | POA: Diagnosis not present

## 2019-01-25 DIAGNOSIS — R7303 Prediabetes: Secondary | ICD-10-CM | POA: Diagnosis not present

## 2019-01-25 DIAGNOSIS — I10 Essential (primary) hypertension: Secondary | ICD-10-CM | POA: Diagnosis not present

## 2019-01-25 DIAGNOSIS — Z0289 Encounter for other administrative examinations: Secondary | ICD-10-CM

## 2019-01-25 DIAGNOSIS — Z9189 Other specified personal risk factors, not elsewhere classified: Secondary | ICD-10-CM | POA: Diagnosis not present

## 2019-01-25 DIAGNOSIS — K588 Other irritable bowel syndrome: Secondary | ICD-10-CM

## 2019-01-25 DIAGNOSIS — F3289 Other specified depressive episodes: Secondary | ICD-10-CM

## 2019-01-25 DIAGNOSIS — E66813 Obesity, class 3: Secondary | ICD-10-CM

## 2019-01-25 DIAGNOSIS — F988 Other specified behavioral and emotional disorders with onset usually occurring in childhood and adolescence: Secondary | ICD-10-CM

## 2019-01-25 DIAGNOSIS — Z6841 Body Mass Index (BMI) 40.0 and over, adult: Secondary | ICD-10-CM

## 2019-01-25 DIAGNOSIS — E669 Obesity, unspecified: Secondary | ICD-10-CM

## 2019-01-25 NOTE — Progress Notes (Signed)
Office: [phone]  /  Fax: [phone]   Dear Madison Coke, PA,   Thank you for referring Madison Robertson to our clinic. The following note includes my evaluation and treatment recommendations.  HPI:   Chief Complaint: OBESITY    Madison Robertson has been referred by Madison Coke, PA for consultation regarding her obesity and obesity related comorbidities.    Madison Robertson (MR# [id number]) is a 28 y.o. female who presents on 01/25/2019 for obesity evaluation and treatment. Current BMI is Body mass index is 41.36 kg/m.Marland Kitchen Madison Robertson has been struggling with her weight for many years and has been unsuccessful in either losing weight, maintaining weight loss, or reaching her healthy weight goal.     Madison Robertson attended our information session and states she is currently in the action stage of change and ready to dedicate time achieving and maintaining a healthier weight. Madison Robertson is interested in becoming our patient and working on intensive lifestyle modifications including (but not limited to) diet, exercise and weight loss.    Madison Robertson states her family eats meals together she thinks her family will eat healthier with  her her desired weight loss is 72 lbs. she started gaining weight around 18 yrs of age her heaviest weight ever was 283 lbs. she states she eats lunch out 5 times a week she craves sweets, soda and potatoes  she snacks frequently in the evenings she wakes up frequently in the middle of the night to eat she skips breakfast 3 to 4 times a week she is frequently drinking liquids with calories she frequently makes poor food choices she has problems with excessive hunger  she frequently eats larger portions than normal  she struggles with emotional eating    Fatigue Madison Robertson feels her energy is lower than it should be. This has worsened with weight gain and has not worsened recently. Madison Robertson admits to daytime somnolence and she admits to waking up still tired. Patient is  at risk for obstructive sleep apnea. Patent has a history of symptoms of daytime fatigue, morning fatigue and hypertension. Patient generally gets 7 or 8 hours of sleep per night, and states they generally have restful sleep. Snoring is present. Apneic episodes are not present. Epworth Sleepiness Score is 2  Dyspnea on exertion Madison Robertson notes increasing shortness of breath with certain activities and seems to be worsening over time with weight gain. She notes getting out of breath sooner with activity than she used to. This has not gotten worse recently. Madison Robertson denies orthopnea.  Hypertension Madison Robertson is a 28 y.o. female with hypertension. She is taking Lisinopril. Madison Robertson denies chest pain. She is working weight loss to help control her blood pressure with the goal of decreasing her risk of heart attack and stroke. Madison Robertson blood pressure is well controlled.  Pre-Diabetes Madison Robertson has a diagnosis of prediabetes based on her elevated Hgb A1c and was informed this puts her at greater risk of developing diabetes. Her last A1c was at 6.1 She is taking metformin currently and she is attempting to work on diet and exercise to decrease risk of diabetes. Madison Robertson has a history of PCOS. She is taking Sprintec.   At risk for diabetes Madison Robertson is at higher than average risk for developing diabetes due to her obesity and prediabetes. She currently denies polyuria or polydipsia.  IBS: Combination (constipation and diarrhea) Madison Robertson has irritable bowel syndrome with a combination of constipation and diarrhea and she takes Bentyl. She has seen GI.  ADD (  attention deficit disorder) Madison Robertson has a diagnosis of attention deficit disorder and she is taking Vyvanse.  Depression with emotional eating behaviors Madison Robertson is struggling with emotional eating and using food for comfort to the extent that it is negatively impacting her health. She often snacks when she is not hungry. Madison Robertson sometimes feels she  is out of control and then feels guilty that she made poor food choices. Madison Robertson has nighttime eating behaviors. She is attempting to work on behavior modification techniques to help reduce her emotional eating. She shows no sign of suicidal or homicidal ideations. PHQ-9 Score is 17  Depression Screen Madison Robertson's Food and Mood (modified PHQ-9) score was  Depression screen PHQ 2/9 01/25/2019  Decreased Interest 3  Down, Depressed, Hopeless 3  PHQ - 2 Score 6  Altered sleeping 1  Tired, decreased energy 3  Change in appetite 3  Feeling bad or failure about yourself  2  Trouble concentrating 1  Moving slowly or fidgety/restless 1  Suicidal thoughts 0  PHQ-9 Score 17  Difficult doing work/chores Very difficult    ASSESSMENT AND PLAN:  Other fatigue - Plan: EKG 12-Lead, VITAMIN D 25 Hydroxy (Vit-D Deficiency, Fractures), T3, T4, free, TSH  Shortness of breath on exertion - Plan: Lipid Panel With LDL/HDL Ratio  Essential hypertension  Prediabetes - Plan: Insulin, random  Other irritable bowel syndrome  Other depression  Attention deficit disorder, unspecified hyperactivity presence  Depression screening  At risk for diabetes mellitus  Class 3 severe obesity with serious comorbidity and body mass index (BMI) of 40.0 to 44.9 in adult, unspecified obesity type (HCC)  PLAN:  Fatigue Madison Robertson was informed that her fatigue may be related to obesity, depression or many other causes. Labs will be ordered, and in the meanwhile Madison Robertson has agreed to work on diet, exercise and weight loss to help with fatigue. Proper sleep hygiene was discussed including the need for 7-8 hours of quality sleep each night. A sleep study was not ordered based on symptoms and Epworth score.  Dyspnea on exertion Madison Robertson's shortness of breath appears to be obesity related and exercise induced. She has agreed to work on weight loss and gradually increase exercise to treat her exercise induced shortness of  breath. If Madison Robertson follows our instructions and loses weight without improvement of her shortness of breath, we will plan to refer to pulmonology. We will monitor this condition regularly. Madison Robertson agrees to this plan.  Hypertension We discussed sodium restriction, working on healthy weight loss, and a regular exercise program as the means to achieve improved blood pressure control. Vance agreed with this plan and agreed to follow up as directed. We will continue to monitor her blood pressure as well as her progress with the above lifestyle modifications. She will continue her medications as prescribed and will watch for signs of hypotension as she continues her lifestyle modifications.  Pre-Diabetes Madison Robertson will begin to work on weight loss, exercise, and decreasing simple carbohydrates in her diet to help decrease the risk of diabetes. She was informed that eating too many simple carbohydrates or too many calories at one sitting increases the likelihood of GI side effects. Rica will continue her medications and follow up with [place] as directed to monitor her progress.  Diabetes risk counseling Debbora was given extended (15 minutes) diabetes prevention counseling today. She is 28 y.o. female and has risk factors for diabetes including obesity and prediabetes. We discussed intensive lifestyle modifications today with an emphasis on weight loss as well as increasing  exercise and decreasing simple carbohydrates in her diet.  IBS: Combination (constipation and diarrhea) Talisha will continue her medications. She will follow up with our clinic in 2 weeks.  ADD (attention deficit disorder) Shristi will continue her medications per her PCP.  Depression with Emotional Eating Behaviors We discussed behavior modification techniques today to help Lynsey deal with her emotional eating and depression. We will refer Madison Robertson to Dr. Mallie Mussel our bariatric psychologist.  Depression Screen Jalaila had a  strongly positive depression screening. Depression is commonly associated with obesity and often results in emotional eating behaviors. We will monitor this closely and work on CBT to help improve the non-hunger eating patterns.   Obesity Shauntee is currently in the action stage of change and her goal is to continue with weight loss efforts. I recommend Mehak begin the structured treatment plan as follows:  She has agreed to follow the category [place] has been instructed to eventually work up to a goal of 150 minutes of combined cardio and strengthening exercise per week for weight loss and overall health benefits. We discussed the following Behavioral Modification Strategies today: planning for success, increase HO intake, no skipping meals, keeping healthy foods in the home, increasing lean protein intake, decreasing simple carbohydrates, increasing vegetables, decrease eating out and work on meal planning and easy cooking plans Jael will not eat at night (see referral to Dr. Mallie Mussel). She will cut back on regular soda. Davanna will read food labels.   She was informed of the importance of frequent follow up visits to maximize her success with intensive lifestyle modifications for her multiple health conditions. She was informed we would discuss her lab results at her next visit unless there is a critical issue that needs to be addressed sooner. Rickie agreed to keep her next visit at the agreed upon time to discuss these results.  ALLERGIES: Allergies  Allergen Reactions  . Hydrocodone Nausea And Vomiting    MEDICATIONS: Current Outpatient Medications on File Prior to Visit  Medication Sig Dispense Refill  . dicyclomine (BENTYL) 20 MG tablet Take 1 tablet (20 mg total) by mouth 3 (three) times daily as needed for spasms. 30 tablet 1  . fluticasone (FLONASE) 50 MCG/ACT nasal spray Place 2 sprays into both nostrils daily. 16 g 6  . furosemide (LASIX) 20 MG tablet Take 1 tablet  (20 mg total) by mouth daily as needed for fluid. 30 tablet 1  . ibuprofen (ADVIL,MOTRIN) 200 MG tablet Take 400 mg by mouth every 6 (six) hours as needed for fever, headache, moderate pain or cramping.     Marland Kitchen lisinopril (ZESTRIL) 20 MG tablet Take 1 tablet (20 mg total) by mouth daily. 30 tablet 1  . metFORMIN (GLUCOPHAGE XR) 500 MG 24 hr tablet Take 1 tablet (500 mg total) by mouth daily with breakfast. 90 tablet 0  . methylphenidate (RITALIN) 20 MG tablet Take 20 mg by mouth 2 (two) times daily.    . norgestimate-ethinyl estradiol (SPRINTEC 28) 0.25-35 MG-MCG tablet Take 1 tablet by mouth daily. 1 Package 5  . ondansetron (ZOFRAN) 4 MG tablet Take 1 tablet (4 mg total) by mouth every 6 (six) hours. 12 tablet 0  . potassium chloride SA (K-DUR) 20 MEQ tablet Take 1 tablet every time you take a lasix tablet. 30 tablet 0  . PROMETHEGAN 25 MG suppository Place 25 mg rectally daily as needed for nausea or vomiting.   0  . triamcinolone cream (KENALOG) 0.1 % Apply 1 application topically daily  as needed (eczema). 30 g 1  . VYVANSE 20 MG capsule     . aluminum chloride (DRYSOL) 20 % external solution Apply topically at bedtime. (Patient not taking: Reported on 01/25/2019) 35 mL 1   Current Facility-Administered Medications on File Prior to Visit  Medication Dose Route Frequency Provider Last Rate Last Dose  . 0.9 %  sodium chloride infusion  500 mL Intravenous Continuous Doran Stabler, MD        PAST MEDICAL HISTORY: Past Medical History:  Diagnosis Date  . ADD (attention deficit disorder)   . Allergy   . Anxiety   . Asthma    childhood asthma - pt out grew  . Depression   . Edema, lower extremity   . Heart murmur   . Hyperlipidemia    Has never been on medication for this  . Hypertension    Lisinopril-HCTZ  . IBS (irritable bowel syndrome)   . Lactose intolerance   . Migraines    well-controlled when blood pressure is controlled  . PCOS (polycystic ovarian syndrome)    10 years;  was on metformin for 2 months but didn't like the way it made her feel; has never been on spironolactone  . Pre-diabetes    Hemoglobin A1c 6.0 in September 2019    PAST SURGICAL HISTORY: Past Surgical History:  Procedure Laterality Date  . WISDOM TOOTH EXTRACTION     Feb 2017    SOCIAL HISTORY: Social History   Tobacco Use  . Smoking status: Never Smoker  . Smokeless tobacco: Never Used  Substance Use Topics  . Alcohol use: Yes    Comment: occassionally  . Drug use: No    FAMILY HISTORY: Family History  Problem Relation Age of Onset  . Diabetes Mother   . Hypertension Mother   . Colon polyps Mother   . High Cholesterol Mother   . Depression Mother   . Anxiety disorder Mother   . Obesity Mother   . Diabetes Father   . Hypertension Father   . Congestive Heart Failure Father   . Heart disease Father   . Prostate cancer Paternal Grandfather   . Breast cancer Other        Paternal great aunt,distal cousin,   . Ovarian cancer Other        Paternal great grandmother  . Uterine cancer Other   . Colon cancer Maternal Grandfather   . Esophageal cancer Maternal Grandmother   . Esophageal cancer Maternal Uncle   . Pancreatic cancer Neg Hx   . Rectal cancer Neg Hx   . Stomach cancer Neg Hx     ROS: Review of Systems  Constitutional: Positive for malaise/fatigue.  HENT:       + Dry Mouth  Respiratory: Positive for shortness of breath (on exertion).   Cardiovascular: Negative for chest pain and orthopnea.  Gastrointestinal: Positive for constipation and diarrhea.  Genitourinary: Negative for frequency.  Musculoskeletal:       + Muscle or Joint Pain  Skin: Positive for itching and rash.       + Dryness  Neurological: Positive for headaches.  Endo/Heme/Allergies: Negative for polydipsia.       Positive for polyphagia  Psychiatric/Behavioral: Positive for depression. Negative for suicidal ideas. The patient is nervous/anxious.        + Stress    PHYSICAL EXAM:  Blood pressure 112/68, pulse (!) 108, temperature 98.7 F (37.1 C), temperature source Oral, height 5\' 8"  (1.727 m), weight 272 lb (123.4 kg),  last menstrual period 12/26/2018, SpO2 97 %. Body mass index is 41.36 kg/m. Physical Exam Vitals signs reviewed.  Constitutional:      Appearance: Normal appearance. She is well-developed. She is obese.  HENT:     Head: Normocephalic and atraumatic.     Nose: Nose normal.  Eyes:     General: No scleral icterus.    Extraocular Movements: Extraocular movements intact.     Right eye: Normal extraocular motion.     Left eye: Normal extraocular motion.  Neck:     Musculoskeletal: Normal range of motion and neck supple.     Thyroid: No thyromegaly.  Cardiovascular:     Rate and Rhythm: Regular rhythm. Tachycardia present.  Pulmonary:     Effort: Pulmonary effort is normal. No respiratory distress.  Abdominal:     Palpations: Abdomen is soft.     Tenderness: There is no abdominal tenderness.  Musculoskeletal: Normal range of motion.     Comments: Range of Motion normal in all 4 extremities  Skin:    General: Skin is warm and dry.  Neurological:     Mental Status: She is alert and oriented to person, place, and time.     Coordination: Coordination normal.  Psychiatric:        Mood and Affect: Mood normal.        Behavior: Behavior normal.        Thought Content: Thought content does not include homicidal or suicidal ideation.     RECENT LABS AND TESTS: BMET    Component Value Date/Time   NA 138 10/30/2018 1435   K 3.1 (L) 10/30/2018 1435   CL 100 10/30/2018 1435   CO2 29 10/30/2018 1435   GLUCOSE 118 (H) 10/30/2018 1435   BUN 8 10/30/2018 1435   CREATININE 0.74 10/30/2018 1435   CALCIUM 9.1 10/30/2018 1435   GFRNONAA >60 05/02/2016 0822   GFRAA >60 05/02/2016 0822   Lab Results  Component Value Date   HGBA1C 6.1 10/30/2018   No results found for: INSULIN CBC    Component Value Date/Time   WBC 14.6 (H) 05/02/2016 0822    RBC 5.51 (H) 05/02/2016 0822   HGB 15.3 (H) 05/02/2016 0822   HCT 46.3 (H) 05/02/2016 0822   PLT 266 05/02/2016 0822   MCV 84.0 05/02/2016 0822   MCH 27.8 05/02/2016 0822   MCHC 33.0 05/02/2016 0822   RDW 14.8 05/02/2016 0822   LYMPHSABS 2.4 05/02/2016 0822   MONOABS 0.6 05/02/2016 0822   EOSABS 0.1 05/02/2016 0822   BASOSABS 0.0 05/02/2016 0822   Iron/TIBC/Ferritin/ %Sat No results found for: IRON, TIBC, FERRITIN, IRONPCTSAT Lipid Panel  No results found for: CHOL, TRIG, HDL, CHOLHDL, VLDL, LDLCALC, LDLDIRECT Hepatic Function Panel     Component Value Date/Time   PROT 9.5 (H) 05/02/2016 0822   ALBUMIN 4.6 05/02/2016 0822   AST 20 05/02/2016 0822   ALT 22 05/02/2016 0822   ALKPHOS 74 05/02/2016 0822   BILITOT 0.6 05/02/2016 0822   No results found for: TSH  ECG  shows NSR with a rate of 113 BPM INDIRECT CALORIMETER done today shows a VO2 of 323 and a REE of 2254.  Her calculated basal metabolic rate is 4158 thus her basal metabolic rate is better than expected.     OBESITY BEHAVIORAL INTERVENTION VISIT  Today's visit was # 1   Starting weight: 272 lbs Starting date: 01/25/2019 Today's weight : 272 lbs  Today's date: 01/25/2019 Total lbs lost to date: 0  01/25/2019  Height 5\' 8"  (1.727 m)  Weight 272 lb (123.4 kg)  BMI (Calculated) 41.37  BLOOD PRESSURE - SYSTOLIC 903  BLOOD PRESSURE - DIASTOLIC 68  Waist Measurement  50 inches   Body Fat % 45.8 %  Total Body Water (lbs) 96 lbs  RMR 2254    ASK: We discussed the diagnosis of obesity with Valerie Salts today and Rema agreed to give [place] permission to discuss obesity behavioral modification therapy today.  ASSESS: Lenyx has the diagnosis of obesity and her BMI today is 41.37 Jacaria is in the action stage of change   ADVISE: Naira was educated on the multiple health risks of obesity as well as the benefit of weight loss to improve her health. She was advised of the need for long term treatment and  the importance of lifestyle modifications to improve her current health and to decrease her risk of future health problems.  AGREE: Multiple dietary modification options and treatment options were discussed and  [employer] agreed to follow the recommendations documented in the above note.  ARRANGE: Kourtnee was educated on the importance of frequent visits to treat obesity as outlined per CMS and USPSTF guidelines and agreed to schedule her next follow up appointment today.  Corey Skains, am acting as Location manager for [employer]. Owens Shark, DO  I have reviewed the above documentation for accuracy and completeness, and I agree with the above. -Jearld Lesch, DO

## 2019-01-26 ENCOUNTER — Encounter (INDEPENDENT_AMBULATORY_CARE_PROVIDER_SITE_OTHER): Payer: Self-pay | Admitting: Bariatrics

## 2019-01-26 LAB — LIPID PANEL WITH LDL/HDL RATIO
Cholesterol, Total: 177 mg/dL (ref 100–199)
HDL: 54 mg/dL (ref >39)
LDL Calculated: 109 mg/dL — ABNORMAL HIGH (ref 0–99)
LDl/HDL Ratio: 2 ratio (ref 0.0–3.2)
Triglycerides: 70 mg/dL (ref 0–149)
VLDL Cholesterol Cal: 14 mg/dL (ref 5–40)

## 2019-01-26 LAB — T4, FREE: Free T4: 1.4 ng/dL (ref 0.82–1.77)

## 2019-01-26 LAB — TSH: TSH: 0.729 u[IU]/mL (ref 0.450–4.500)

## 2019-01-26 LAB — INSULIN, RANDOM: INSULIN: 44.3 u[IU]/mL — ABNORMAL HIGH (ref 2.6–24.9)

## 2019-01-26 LAB — T3: T3, Total: 226 ng/dL — ABNORMAL HIGH (ref 71–180)

## 2019-01-26 LAB — VITAMIN D 25 HYDROXY (VIT D DEFICIENCY, FRACTURES): Vit D, 25-Hydroxy: 11.4 ng/mL — ABNORMAL LOW (ref 30.0–100.0)

## 2019-01-26 NOTE — Progress Notes (Signed)
Office: [phone]  /  Fax: [phone]    Date: February 02, 2019   Appointment Start Time: 9:01am Duration: 40 minutes Provider: Glennie Isle, Psy.D. Type of Session: Intake for Individual Therapy  Location of Patient: Home Location of Provider: [facility] Type of Contact: Telepsychological Visit via [employer] WebEx  Informed Consent: Prior to proceeding with today's appointment, two pieces of identifying information were obtained from Madison Robertson to verify identity. In addition, Madison Robertson's physical location at the time of this appointment was obtained. Madison Robertson reported she was at home and provided the address. In the event of technical difficulties, Madison Robertson shared a phone number she could be reached at. Madison Robertson and this provider participated in today's telepsychological service. Also, Madison Robertson denied anyone else being present in the room or on the WebEx appointment.   The provider's role was explained to Madison Robertson. The provider reviewed and discussed issues of confidentiality, privacy, and limits therein (e.g., reporting obligations). In addition to verbal informed consent, written informed consent for psychological services was obtained from Madison Robertson prior to the initial intake interview. Written consent included information concerning the practice, financial arrangements, and confidentiality and patients' rights. Since the clinic is not a 24/7 crisis center, mental health emergency resources were shared, and the provider explained MyChart, e-mail, voicemail, and/or other messaging systems should be utilized only for non-emergency reasons. This provider also explained that information obtained during appointments will be placed in Madison Robertson's medical record in a confidential manner and relevant information will be shared with other providers at [facility] that she meets with for coordination of care. Madison Robertson verbally acknowledged understanding of the aforementioned,  and agreed to use mental health emergency resources discussed if needed. Moreover, Madison Robertson agreed information may be shared with other [facility] providers as needed for coordination of care. By signing the service agreement document, Madison Robertson provided written consent for coordination of care.   Prior to initiating telepsychological services, Madison Robertson was provided with an informed consent document, which included the development of a safety plan (i.e., an emergency contact and emergency resources) in the event of an emergency/crisis. Madison Robertson expressed understanding of the rationale of the safety plan and provided consent for this provider to reach out to her emergency contact in the event of an emergency/crisis. Madison Robertson returned the completed consent form prior to today's appointment. This provider verbally reviewed the consent form during today's appointment prior to proceeding with the appointment. Madison Robertson verbally acknowledged understanding that she is ultimately responsible for understanding her insurance benefits as it relates to reimbursement of telepsychological and in-person services. This provider also reviewed confidentiality, as it relates to telepsychological services, as well as the rationale for telepsychological services. More specifically, this provider's clinic is limiting in-person visits due to COVID-19. Therapeutic services will resume to in-person appointments once deemed appropriate. Madison Robertson expressed understanding regarding the rationale for telepsychological services. In addition, this provider explained the telepsychological services informed consent document would be considered an addendum to the initial consent document/service agreement. Madison Robertson verbally consented to proceed.   Chief Complaint/HPI: Madison Robertson was referred by Dr. Jearld Lesch due to depression with emotional eating behaviors. Per the note for the initial visit with Dr. Jearld Lesch on January 25, 2019, "Madison Robertson is  struggling with emotional eating and using food for comfort to the extent that it is negatively impacting her health. She often snacks when she is not hungry. Madison Robertson sometimes feels she is out of control and then feels guilty that she made poor  food choices. Madison Robertson has nighttime eating behaviors. She is attempting to work on behavior modification techniques to help reduce her emotional eating. She shows no sign of suicidal or homicidal ideations. PHQ-9 Score is 17." Madison Robertson further reported experiencing the following: snacking frequently in the evenings, frequently drinking liquids with calories, frequently making poor food choices, frequently eating larger portions than normal , struggling with emotional eating, waking up frquently in the middle of the night to eat, eating out for lunch 5 times a week and skipping breakfast 3-4 times a week.   During today's appointment, Madison Robertson was verbally administered a questionnaire assessing various behaviors related to emotional eating. Madison Robertson endorsed the following: overeat when you are celebrating, experience food cravings on a regular basis, use food to help you cope with emotional situations, find food is comforting to you, overeat when you are worried about something, overeat frequently when you are bored or lonely, not worry about what you eat when you are in a good mood, overeat when you are alone, but eat much less when you are with other people and eat as a reward. She indicated the onset of emotional eating was around age 5 as that is when she noticed weight gain. She noted she was also diagnosed with PCOS around that time and had other family stressors (e.g., health of mother and loss of her maternal great grandmother). Madison Robertson described the frequency of emotional eating as "every day" before starting with the clinic. Chart review revealed she craves sweets, soda, and potatoes. Additionally, Kindle denied a history of binge eating. Madison Robertson denied a history  of restricting food intake, purging and engagement in other compensatory strategies, and has never been diagnosed with an eating disorder. She also denied a history of treatment for emotional eating. Moreover, Madison Robertson indicated work stress and worry about the pandemic triggers emotional eating, whereas traveling and being "around positive things" makes emotional eating better. Furthermore, Madison Robertson denied other problems of concern.    Mental Status Examination:  Appearance: neat Behavior: cooperative Mood: euthymic Affect: mood congruent Speech: normal in rate, volume, and tone Eye Contact: appropriate Psychomotor Activity: appropriate Thought Process: linear, logical, and goal directed  Content/Perceptual Disturbances: denies suicidal and homicidal ideation, plan, and intent and no hallucinations, delusions, bizarre thinking or behavior reported or observed Orientation: time, person, place and purpose of appointment Cognition/Sensorium: memory, attention, language, and fund of knowledge intact  Insight: good Judgment: good  Family & Psychosocial History: Shabana reported she is not in a relationship and does not have any children. 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 Currently, Carmellia's social support system consists of her mother, co-workers, and a couple friends. Moreover, Weronika stated she resides with her mother.   Medical History:  Past Medical History:  Diagnosis Date   ADD (attention deficit disorder)    Allergy    Anxiety    Asthma    childhood asthma - pt out grew   Depression    Edema, lower extremity    Heart murmur    Hyperlipidemia    Has never been on medication for this   Hypertension    Lisinopril-HCTZ   IBS (irritable bowel syndrome)    Lactose intolerance    Migraines    well-controlled when blood  pressure is controlled   PCOS (polycystic ovarian syndrome)    10 years; was on metformin for 2 months but didn't like  the way it made her feel; has never been on spironolactone   Pre-diabetes    Hemoglobin A1c 6.0 in September 2019   Past Surgical History:  Procedure Laterality Date   WISDOM TOOTH EXTRACTION     Feb 2017   Current Outpatient Medications on File Prior to Visit  Medication Sig Dispense Refill   aluminum chloride (DRYSOL) 20 % external solution Apply topically at bedtime. (Patient not taking: Reported on 01/25/2019) 35 mL 1   dicyclomine (BENTYL) 20 MG tablet Take 1 tablet (20 mg total) by mouth 3 (three) times daily as needed for spasms. 30 tablet 1   fluticasone (FLONASE) 50 MCG/ACT nasal spray Place 2 sprays into both nostrils daily. 16 g 6   furosemide (LASIX) 20 MG tablet Take 1 tablet (20 mg total) by mouth daily as needed for fluid. 30 tablet 1   ibuprofen (ADVIL,MOTRIN) 200 MG tablet Take 400 mg by mouth every 6 (six) hours as needed for fever, headache, moderate pain or cramping.      lisinopril (ZESTRIL) 20 MG tablet Take 1 tablet (20 mg total) by mouth daily. 30 tablet 1   metFORMIN (GLUCOPHAGE XR) 500 MG 24 hr tablet Take 1 tablet (500 mg total) by mouth daily with breakfast. 90 tablet 0   methylphenidate (RITALIN) 20 MG tablet Take 20 mg by mouth 2 (two) times daily.     norgestimate-ethinyl estradiol (SPRINTEC 28) 0.25-35 MG-MCG tablet Take 1 tablet by mouth daily. 1 Package 5   ondansetron (ZOFRAN) 4 MG tablet Take 1 tablet (4 mg total) by mouth every 6 (six) hours. 12 tablet 0   potassium chloride SA (K-DUR) 20 MEQ tablet Take 1 tablet every time you take a lasix tablet. 30 tablet 0   PROMETHEGAN 25 MG suppository Place 25 mg rectally daily as needed for nausea or vomiting.   0   triamcinolone cream (KENALOG) 0.1 % Apply 1 application topically daily as needed (eczema). 30 g 1   VYVANSE 20 MG capsule      Current Facility-Administered  Medications on File Prior to Visit  Medication Dose Route Frequency Provider Last Rate Last Dose   0.9 %  sodium chloride infusion  500 mL Intravenous Continuous Danis, Kirke Corin, MD      Santana reported she had a "black out episode" when she was 16 or 17 secondary to the flu and dehydration. Razia received medical attention and there was reportedly concern about a head injury due to the "angle" at which she fell. She lost consciousness for a "few seconds."   Mental Health History: Ryleigh endorsed a history of mental health treatment, including therapeutic services. As a "little girl," Kannon stated she attended speech therapy and she was diagnosed with ADD by a psychiatrist. She believes ADD contributed to "behavioral problems" resulting in services in childhood. She indicated she is currently in therapy for ADD with Dr. Rachel Moulds and they began working together in February 2020. Lakena stated she is a psychiatrist and she is currently prescribing Vyvanse. She noted a plan to meet with her "in a week or so" to change from Vyvanse to another medication as it is impacting her menstrual cycle. Currently, Evgenia meets with Dr. Johnnye Sima "every 3 months;" however, frequency was reduced due to COVID-19. She denied their focus of treatment being on eating. Previously, Tonee shared she was prescribed Prozac and Zoloft for depression. She also discussed a history of Ritalin and Concerta. Deandre denied a history of hospitalizations for psychiatric concerns. Alia endorsed  a family history of mental health related concerns. She indicated her mother is diagnosed with depression and anxiety. Bailyn stated, "There are [maternal] several aunts and cousins that have depression and I have a [maternal] great uncle that committed suicide 15 or 15 years ago." Madigan denied a trauma history, including psychological, physical  and sexual abuse, as well as neglect.   Frona described her typical mood as  "good." Aside from concerns noted above and endorsed on the PHQ-9 and GAD-7, Harlie reported experiencing worry thoughts about work and COVID-19 and decreased self-esteem due to weight. Zariana endorsed socially consuming alcohol. She described the frequency as "a wine cooler once a week." She denied tobacco use. She denied illicit/recreational substance use. Regarding caffeine intake, Leshay reported consuming coffee and soda prior to starting with the clinic. Now, she consumes Crystal Light with caffeine. Furthermore, Shajuan denied experiencing the following: hopelessness, hallucinations and delusions, paranoia, mania and decreased motivation. She also denied history of and current suicidal ideation, plan, and intent; history of and current homicidal ideation, plan, and intent; and history of and current engagement in self-harm.  The following strengths were reported by Majestic: good problem solver, helpful, caring, and self-aware. The following strengths were observed by this provider: ability to express thoughts and feelings during the therapeutic session, ability to establish and benefit from a therapeutic relationship, ability to learn and practice coping skills, willingness to work toward established goal(s) with the clinic and ability to engage in reciprocal conversation.  Legal History: Alyissa denied a history of legal involvement.   Structured Assessment Results: The Patient Health Questionnaire-9 (PHQ-9) is a self-report measure that assesses symptoms and severity of depression over the course of the last two weeks. Tameria obtained a score of 7 suggesting mild depression. Citlali finds the endorsed symptoms to be somewhat difficult. Little interest or pleasure in doing things 1  Feeling down, depressed, or hopeless 2  Trouble falling or staying asleep, or sleeping too much 2  Feeling tired or having little energy 1  Poor appetite or overeating 0  Feeling bad about yourself --- or that you  are a failure or have let yourself or your family down 1  Trouble concentrating on things, such as reading the newspaper or watching television 0  Moving or speaking so slowly that other people could have noticed? Or the opposite --- being so fidgety or restless that you have been moving around a lot more than usual 0  Thoughts that you would be better off dead or hurting yourself in some way 0  PHQ-9 Score 7    The Generalized Anxiety Disorder-7 (GAD-7) is a brief self-report measure that assesses symptoms of anxiety over the course of the last two weeks. Gladis obtained a score of 11 suggesting moderate anxiety. Marabeth finds the endorsed symptoms to be very difficult. Feeling nervous, anxious, on edge 1  Not being able to stop or control worrying 1  Worrying too much about different things 1  Trouble relaxing 3  Being so restless that it's hard to sit still 0  Becoming easily annoyed or irritable 2  Feeling afraid as if something awful might happen 3  GAD-7 Score 11   Interventions: A chart review was conducted prior to the clinical intake interview. The PHQ-9, and GAD-7 were verbally administered as well as a Mood and Food questionnaire to assess various behaviors related to emotional eating. Throughout session, empathic reflections and validation was provided. Continuing treatment with this provider was discussed and a treatment goal  was established. Psychoeducation regarding emotional versus physical hunger was provided. Declan was sent a handout via e-mail to utilize between now and the next appointment to increase awareness of hunger patterns and subsequent eating. Alane provided verbal consent during today's appointment for this provider to send the handout via e-mail.   Provisional DSM-5 Diagnoses:  311 (F32.8) Other Specified Depressive Disorder, Emotional Eating Behaviors 314.01 (F90.9) Unspecified Attention-Deficit/Hyperactivity Disorder   Plan: Kaijah appears able and willing  to participate as evidenced by collaboration on a treatment goal, engagement in reciprocal conversation, and asking questions as needed for clarification. The next appointment will be scheduled in two weeks, which will be via [employer]. The following treatment goal was established: decrease emotional eating. Once this provider's office resumes in-person appointments and it is deemed appropriate, Lubertha will be notified. For the aforementioned goal, Corin can benefit from biweekly individual therapy sessions that are brief in duration for approximately four to six sessions. The treatment modality will be individual therapeutic services, including an eclectic therapeutic approach utilizing techniques from Cognitive Behavioral Therapy, Patient Centered Therapy, Dialectical Behavior Therapy, Acceptance and Commitment Therapy, Interpersonal Therapy, and Cognitive Restructuring. Therapeutic approach will include various interventions as appropriate, such as validation, support, mindfulness, thought defusion, reframing, psychoeducation, values assessment, and role playing. This provider will regularly review the treatment plan and medical chart to keep informed of status changes. Julitza expressed understanding and agreement with the initial treatment plan of care.

## 2019-02-02 ENCOUNTER — Ambulatory Visit (INDEPENDENT_AMBULATORY_CARE_PROVIDER_SITE_OTHER): Payer: No Typology Code available for payment source | Admitting: Psychology

## 2019-02-02 ENCOUNTER — Other Ambulatory Visit: Payer: Self-pay

## 2019-02-02 DIAGNOSIS — F909 Attention-deficit hyperactivity disorder, unspecified type: Secondary | ICD-10-CM | POA: Diagnosis not present

## 2019-02-02 DIAGNOSIS — F3289 Other specified depressive episodes: Secondary | ICD-10-CM | POA: Diagnosis not present

## 2019-02-08 ENCOUNTER — Ambulatory Visit (INDEPENDENT_AMBULATORY_CARE_PROVIDER_SITE_OTHER): Payer: Self-pay | Admitting: Bariatrics

## 2019-02-15 MED FILL — LISINOPRIL 20 MG TABLET: 20 | 30 days supply | Qty: 30 | Fill #1

## 2019-02-15 MED FILL — FEMYNOR 0.25-35 MG-MCG TABS: 0.25-35 | 84 days supply | Qty: 84 | Fill #1

## 2019-02-15 MED FILL — FUROSEMIDE 20 MG TABS: 20 | 30 days supply | Qty: 30 | Fill #1

## 2019-02-16 MED FILL — ONDANSETRON HCL 4 MG TABLET: 4 | 3 days supply | Qty: 12 | Fill #0

## 2019-02-16 MED FILL — DICYCLOMINE 20 MG TABLET: 20 | 10 days supply | Qty: 30 | Fill #0

## 2019-02-16 NOTE — Progress Notes (Addendum)
Office: [phone]  /  Fax: [phone]    Date: February 18, 2019   Appointment Start Time: 8:00am Duration: 28 minutes Provider: Glennie Isle, Psy.D. Type of Session: Individual Therapy  Location of Patient: Home Location of Provider: Provider's Home Type of Contact: Telepsychological Visit via [employer] Web[employer]   Session Content: Madison Robertson is a 28 y.o. female presenting via [employer] for a follow-up appointment to address the previously established treatment goal of decreasing emotional eating. Today's appointment was a telepsychological visit, as this provider's clinic is seeing a limited number of patients for in-person visits due to COVID-19. Therapeutic services will resume to in-person appointments once deemed appropriate. Madison Robertson expressed understanding regarding the rationale for telepsychological services, and provided verbal consent for today's appointment. Prior to proceeding with today's appointment, Madison Robertson's physical location at the time of this appointment was obtained. Madison Robertson reported she was at home and provided the address. In the event of technical difficulties, Madison Robertson shared a phone number she could be reached at. Madison Robertson and this provider participated in today's telepsychological service. Also, Madison Robertson denied anyone else being present in the room or on the WebEx appointment.  This provider conducted a brief check-in and verbally administered the PHQ-9 and GAD-7. Madison Robertson stated she had a "terrible IBS episode" in the past week lasting over a week, which was likely due to stress. She noted a plan to speak with her GI doctor today. In addition, Madison Robertson explained her recent worry is secondary to "uncertainity at work" and "constant fear" of COVID-19 exposure. Regarding eating, Madison Robertson shared about her eating habits prior to her recent IBS episode. She denied episodes of emotional eating and noted she "completely stopped" eating at night. However, she indicated she "rewarded" herself  after the appointment with this provider with nuggets from [employer]. It was reflected the aforementioned is an example of emotional eating. Madison Robertson stated she plans to discuss food options for when she is sick during her appointment with [name] today. Moreover, psychoeducation regarding triggers for emotional eating was provided. Madison Robertson was provided a handout, and encouraged to utilize the handout between now and the next appointment to increase awareness of triggers and frequency. Madison Robertson agreed. This provider also discussed behavioral strategies for specific triggers, such as placing the utensil down when conversing to avoid mindless eating. Madison Robertson provided verbal consent during today's appointment for this provider to send the handout via e-mail. Madison Robertson was receptive to today's session as evidenced by openness to sharing, responsiveness to feedback, and willingness to explore triggers for emotional eating.  Mental Status Examination:  Appearance: neat Behavior: cooperative Mood: euthymic Affect: mood congruent Speech: normal in rate, volume, and tone Eye Contact: appropriate Psychomotor Activity: appropriate Thought Process: linear, logical, and goal directed  Content/Perceptual Disturbances: denies suicidal and homicidal ideation, plan, and intent and no hallucinations, delusions, bizarre thinking or behavior reported or observed Orientation: time, person, place and purpose of appointment Cognition/Sensorium: memory, attention, language, and fund of knowledge intact  Insight: good Judgment: good  Structured Assessment Results: The Patient Health Questionnaire-9 (PHQ-9) is a self-report measure that assesses symptoms and severity of depression over the course of the last two weeks. Madison Robertson obtained a score of 5 suggesting mild depression. Madison Robertson finds the endorsed symptoms to be somewhat difficult. Madison Robertson interest or pleasure in doing things 1  Feeling down, depressed, or hopeless 0   Trouble falling or staying asleep, or sleeping too much 0  Feeling tired or having Madison Robertson energy 1  Poor appetite or overeating 3  Feeling bad about yourself --- or that you are a failure or have let yourself or your family down 0  Trouble concentrating on things, such as reading the newspaper or watching television 0  Moving or speaking so slowly that other people could have noticed? Or the opposite --- being so fidgety or restless that you have been moving around a lot more than usual 0  Thoughts that you would be better off dead or hurting yourself in some way 0  PHQ-9 Score 5    The Generalized Anxiety Disorder-7 (GAD-7) is a brief self-report measure that assesses symptoms of anxiety over the course of the last two weeks. Madison Robertson obtained a score of 13 suggesting moderate anxiety. Madison Robertson finds the endorsed symptoms to be somewhat difficult. Feeling nervous, anxious, on edge 3  Not being able to stop or control worrying 0  Worrying too much about different things 3  Trouble relaxing 3  Being so restless that it's hard to sit still 0  Becoming easily annoyed or irritable 3  Feeling afraid as if something awful might happen 1  GAD-7 Score 13   Interventions:  Conducted a brief chart review Verbal administration of PHQ-9 and GAD-7 for symptom monitoring Provided empathic reflections and validation Reviewed content from the previous session Psychoeducation provided regarding triggers for emotional eating Focused on rapport building Employed supportive psychotherapy interventions to facilitate reduced distress, and to improve coping skills with identified stressors  DSM-5 Diagnoses:  311 (F32.8) Other Specified Depressive Disorder, Emotional Eating Behaviors 314.01 (F90.9) Unspecified Attention-Deficit/Hyperactivity Disorder   Treatment Goal & Progress: During the initial appointment with this provider, the following treatment goal was established: decrease emotional eating. Progress  is limited, as Madison Robertson has just begun treatment with this provider; however, she is receptive to the interaction and interventions and rapport is being established.   Plan: Wesleigh continues to appear able and willing to participate as evidenced by engagement in reciprocal conversation, and asking questions for clarification as appropriate. The next appointment will be scheduled in two weeks, which will be via [employer]. Once this provider's office resumes in-person appointments and it is deemed appropriate, Telma will be notified. The next session will focus on reviewing triggers for emotional eating and the introduction to mindfulness.

## 2019-02-18 ENCOUNTER — Encounter (INDEPENDENT_AMBULATORY_CARE_PROVIDER_SITE_OTHER): Payer: Self-pay | Admitting: Family Medicine

## 2019-02-18 ENCOUNTER — Ambulatory Visit (INDEPENDENT_AMBULATORY_CARE_PROVIDER_SITE_OTHER): Payer: No Typology Code available for payment source | Admitting: Psychology

## 2019-02-18 ENCOUNTER — Ambulatory Visit (INDEPENDENT_AMBULATORY_CARE_PROVIDER_SITE_OTHER): Payer: No Typology Code available for payment source | Admitting: Family Medicine

## 2019-02-18 ENCOUNTER — Other Ambulatory Visit: Payer: Self-pay

## 2019-02-18 VITALS — BP 137/81 | HR 116 | Temp 98.3°F | Ht 68.0 in | Wt 273.0 lb

## 2019-02-18 DIAGNOSIS — E7849 Other hyperlipidemia: Secondary | ICD-10-CM | POA: Diagnosis not present

## 2019-02-18 DIAGNOSIS — F909 Attention-deficit hyperactivity disorder, unspecified type: Secondary | ICD-10-CM

## 2019-02-18 DIAGNOSIS — Z6841 Body Mass Index (BMI) 40.0 and over, adult: Secondary | ICD-10-CM

## 2019-02-18 DIAGNOSIS — E559 Vitamin D deficiency, unspecified: Secondary | ICD-10-CM | POA: Diagnosis not present

## 2019-02-18 DIAGNOSIS — F3289 Other specified depressive episodes: Secondary | ICD-10-CM

## 2019-02-18 DIAGNOSIS — R7303 Prediabetes: Secondary | ICD-10-CM | POA: Diagnosis not present

## 2019-02-18 DIAGNOSIS — Z9189 Other specified personal risk factors, not elsewhere classified: Secondary | ICD-10-CM

## 2019-02-18 DIAGNOSIS — R7989 Other specified abnormal findings of blood chemistry: Secondary | ICD-10-CM

## 2019-02-18 MED ORDER — VITAMIN D (ERGOCALCIFEROL) 1.25 MG (50000 UNIT) PO CAPS: 50000.0000 [IU] | ORAL_CAPSULE | ORAL | 0 refills | Status: DC

## 2019-02-18 MED FILL — VIT D2 1.25 MG (50,000 UNIT: 1.25 MG | 28 days supply | Qty: 4 | Fill #0

## 2019-02-19 MED FILL — CONCERTA 36 MG TABLET ER: 36 | 30 days supply | Qty: 30 | Fill #0

## 2019-02-19 MED FILL — METHYLPHENIDATE 20 MG TAB: 20 | 30 days supply | Qty: 30 | Fill #0

## 2019-02-22 NOTE — Progress Notes (Signed)
Office: [phone]  /  Fax: [phone]   HPI:   Chief Complaint: OBESITY Madison Robertson is here to discuss her progress with her obesity treatment plan. She is on the Category 4 plan and is following her eating plan approximately 100 % of the time. She states she is on the treadmill for 30 minutes 5 times per week. Madison Robertson voices the first 2 weeks were really on the plan, and she followed the plan 100 %. Then she had a severe flare of IBD (irritable bowel syndrome), and has only been able to tolerate ginger ale and crackers. She denies hunger, but had minimal cravings except the first few days.  Her weight is 273 lb (123.8 kg) today and has gained 1 lb since her last visit. She has lost 0 lbs since starting treatment with [place].  Pre-Diabetes Madison Robertson has a diagnosis of pre-diabetes based on her elevated Hgb A1c and was informed this puts her at greater risk of developing diabetes. She has had pre-diabetes for approximately 10 years. She is on metformin daily and denies GI side effects. She still notes occasional cravings. continues to work on diet and exercise to decrease risk of diabetes. She denies hypoglycemia.  Hyperlipidemia Madison Robertson has hyperlipidemia and has been trying to improve her cholesterol levels with intensive lifestyle modification including a low saturated fat diet, exercise and weight loss. Last LDL was of 109, HDL of 54, and triglycerides of 70. She denies any chest pain, claudication or myalgias.  Vitamin D Deficiency Madison Robertson has a diagnosis of vitamin D deficiency. She currently is not on OTC Vit D. Last Vit D level was 11.4. She notes fatigue and denies nausea, vomiting or muscle weakness.  At risk for osteopenia and osteoporosis Madison Robertson is at higher risk of osteopenia and osteoporosis due to vitamin D deficiency.   Abdominal Thyroid Test Madison Robertson's last TSH was within normal limits, T4 was within normal limits, and T3 was elevated at 226. She notes fatigue and has no prior  abnormal thyroid tests.  ASSESSMENT AND PLAN:  Prediabetes  Other hyperlipidemia  Vitamin D deficiency - Plan: Vitamin D, Ergocalciferol, (DRISDOL) 1.25 MG (50000 UT) CAPS capsule  Abnormal thyroid blood test  At risk for osteoporosis  Class 3 severe obesity with serious comorbidity and body mass index (BMI) of 40.0 to 44.9 in adult, unspecified obesity type ([place])  PLAN:  Pre-Diabetes Madison Robertson will continue to work on weight loss, exercise, and decreasing simple carbohydrates in her diet to help decrease the risk of diabetes. We dicussed metformin including benefits and risks. She was informed that eating too many simple carbohydrates or too many calories at one sitting increases the likelihood of GI side effects. Verdine agrees to continue taking metformin 500 mg PO q AM #30 and we will refill for 1 month. Madison Robertson agrees to follow up with our in 2 weeks as directed to monitor her progress.  Hyperlipidemia Madison Robertson was informed of the American Heart Association Guidelines emphasizing intensive lifestyle modifications as the first line treatment for hyperlipidemia. We discussed many lifestyle modifications today in depth, and Madison Robertson will continue to work on decreasing saturated fats such as fatty red meat, butter and many fried foods. She will also increase vegetables and lean protein in her diet and continue to work on exercise and weight loss efforts. We will repeat FLP in 3 months. Zynasia agrees to follow up with our clinic in 2 weeks.  Vitamin D Deficiency Madison Robertson was informed that low vitamin D levels contributes to fatigue and are  associated with obesity, breast, and colon cancer. Madison Robertson agrees to start prescription Vit D 50,000 IU every week #4 with no refills. She will follow up for routine testing of vitamin D, at least 2-3 times per year. She was informed of the risk of over-replacement of vitamin D and agrees to not increase her dose unless she discusses this with [place] first. We  will repeat labs in 3 months. Madison Robertson agrees to follow up with our clinic in 2 weeks.  At risk for osteopenia and osteoporosis Madison Robertson was given extended (30 minutes) osteoporosis prevention counseling today. Madison Robertson is at risk for osteopenia and osteoporsis due to her vitamin D deficiency. She was encouraged to take her vitamin D and follow her higher calcium diet and increase strengthening exercise to help strengthen her bones and decrease her risk of osteopenia and osteoporosis.  Abdominal Thyroid Test We will repeat labs in 3 weeks. Madison Robertson agrees to follow up with our clinic in 2 weeks.  Obesity Madison Robertson is currently in the action stage of change. As such, her goal is to continue with weight loss efforts She has agreed to follow the Category 4 plan Madison Robertson has been instructed to work up to a goal of 150 minutes of combined cardio and strengthening exercise per week for weight loss and overall health benefits. We discussed the following Behavioral Modification Strategies today: increasing lean protein intake, increasing vegetables and work on meal planning and easy cooking plans, keeping healthy foods in the home, and planning for success   Madison Robertson has agreed to follow up with our clinic in 2 weeks. She was informed of the importance of frequent follow up visits to maximize her success with intensive lifestyle modifications for her multiple health conditions.  ALLERGIES: Allergies  Allergen Reactions  . Hydrocodone Nausea And Vomiting    MEDICATIONS: Current Outpatient Medications on File Prior to Visit  Medication Sig Dispense Refill  . aluminum chloride (DRYSOL) 20 % external solution Apply topically at bedtime. 35 mL 1  . dicyclomine (BENTYL) 20 MG tablet Take 1 tablet (20 mg total) by mouth 3 (three) times daily as needed for spasms. 30 tablet 1  . fluticasone (FLONASE) 50 MCG/ACT nasal spray Place 2 sprays into both nostrils daily. 16 g 6  . furosemide (LASIX) 20 MG tablet Take  1 tablet (20 mg total) by mouth daily as needed for fluid. 30 tablet 1  . ibuprofen (ADVIL,MOTRIN) 200 MG tablet Take 400 mg by mouth every 6 (six) hours as needed for fever, headache, moderate pain or cramping.     Marland Kitchen lisinopril (ZESTRIL) 20 MG tablet Take 1 tablet (20 mg total) by mouth daily. 30 tablet 1  . metFORMIN (GLUCOPHAGE XR) 500 MG 24 hr tablet Take 1 tablet (500 mg total) by mouth daily with breakfast. 90 tablet 0  . methylphenidate (RITALIN) 20 MG tablet Take 20 mg by mouth 2 (two) times daily.    . norgestimate-ethinyl estradiol (SPRINTEC 28) 0.25-35 MG-MCG tablet Take 1 tablet by mouth daily. 1 Package 5  . ondansetron (ZOFRAN) 4 MG tablet Take 1 tablet (4 mg total) by mouth every 6 (six) hours. 12 tablet 0  . potassium chloride SA (K-DUR) 20 MEQ tablet Take 1 tablet every time you take a lasix tablet. 30 tablet 0  . PROMETHEGAN 25 MG suppository Place 25 mg rectally daily as needed for nausea or vomiting.   0  . triamcinolone cream (KENALOG) 0.1 % Apply 1 application topically daily as needed (eczema). 30 g 1  .  VYVANSE 20 MG capsule      Current Facility-Administered Medications on File Prior to Visit  Medication Dose Route Frequency Provider Last Rate Last Dose  . 0.9 %  sodium chloride infusion  500 mL Intravenous Continuous Doran Stabler, MD        PAST MEDICAL HISTORY: Past Medical History:  Diagnosis Date  . ADD (attention deficit disorder)   . Allergy   . Anxiety   . Asthma    childhood asthma - pt out grew  . Depression   . Edema, lower extremity   . Heart murmur   . Hyperlipidemia    Has never been on medication for this  . Hypertension    Lisinopril-HCTZ  . IBS (irritable bowel syndrome)   . Lactose intolerance   . Migraines    well-controlled when blood pressure is controlled  . PCOS (polycystic ovarian syndrome)    10 years; was on metformin for 2 months but didn't like the way it made her feel; has never been on spironolactone  . Pre-diabetes     Hemoglobin A1c 6.0 in September 2019    PAST SURGICAL HISTORY: Past Surgical History:  Procedure Laterality Date  . WISDOM TOOTH EXTRACTION     Feb 2017    SOCIAL HISTORY: Social History   Tobacco Use  . Smoking status: Never Smoker  . Smokeless tobacco: Never Used  Substance Use Topics  . Alcohol use: Yes    Comment: occassionally  . Drug use: No    FAMILY HISTORY: Family History  Problem Relation Age of Onset  . Diabetes Mother   . Hypertension Mother   . Colon polyps Mother   . High Cholesterol Mother   . Depression Mother   . Anxiety disorder Mother   . Obesity Mother   . Diabetes Father   . Hypertension Father   . Congestive Heart Failure Father   . Heart disease Father   . Prostate cancer Paternal Grandfather   . Breast cancer Other        Paternal great aunt,distal cousin,   . Ovarian cancer Other        Paternal great grandmother  . Uterine cancer Other   . Colon cancer Maternal Grandfather   . Esophageal cancer Maternal Grandmother   . Esophageal cancer Maternal Uncle   . Pancreatic cancer Neg Hx   . Rectal cancer Neg Hx   . Stomach cancer Neg Hx     ROS: Review of Systems  Constitutional: Positive for malaise/fatigue. Negative for weight loss.  Cardiovascular: Negative for chest pain and claudication.  Gastrointestinal: Negative for nausea and vomiting.  Musculoskeletal: Negative for myalgias.       Negative muscle weakness  Endo/Heme/Allergies:       Negative hypoglycemia    PHYSICAL EXAM: Blood pressure 137/81, pulse (!) 116, temperature 98.3 F (36.8 C), temperature source Oral, height 5\' 8"  (1.727 m), weight 273 lb (123.8 kg), SpO2 98 %. Body mass index is 41.51 kg/m. Physical Exam Vitals signs reviewed.  Constitutional:      Appearance: Normal appearance. She is obese.  Cardiovascular:     Rate and Rhythm: Normal rate.     Pulses: Normal pulses.  Pulmonary:     Effort: Pulmonary effort is normal.     Breath sounds: Normal  breath sounds.  Musculoskeletal: Normal range of motion.  Skin:    General: Skin is warm and dry.  Neurological:     Mental Status: She is alert and oriented to person,  place, and time.  Psychiatric:        Mood and Affect: Mood normal.        Behavior: Behavior normal.     RECENT LABS AND TESTS: BMET    Component Value Date/Time   NA 138 10/30/2018 1435   K 3.1 (L) 10/30/2018 1435   CL 100 10/30/2018 1435   CO2 29 10/30/2018 1435   GLUCOSE 118 (H) 10/30/2018 1435   BUN 8 10/30/2018 1435   CREATININE 0.74 10/30/2018 1435   CALCIUM 9.1 10/30/2018 1435   GFRNONAA >60 05/02/2016 0822   GFRAA >60 05/02/2016 0822   Lab Results  Component Value Date   HGBA1C 6.1 10/30/2018   Lab Results  Component Value Date   INSULIN 44.3 (H) 01/25/2019   CBC    Component Value Date/Time   WBC 14.6 (H) 05/02/2016 0822   RBC 5.51 (H) 05/02/2016 0822   HGB 15.3 (H) 05/02/2016 0822   HCT 46.3 (H) 05/02/2016 0822   PLT 266 05/02/2016 0822   MCV 84.0 05/02/2016 0822   MCH 27.8 05/02/2016 0822   MCHC 33.0 05/02/2016 0822   RDW 14.8 05/02/2016 0822   LYMPHSABS 2.4 05/02/2016 0822   MONOABS 0.6 05/02/2016 0822   EOSABS 0.1 05/02/2016 0822   BASOSABS 0.0 05/02/2016 0822   Iron/TIBC/Ferritin/ %Sat No results found for: IRON, TIBC, FERRITIN, IRONPCTSAT Lipid Panel     Component Value Date/Time   CHOL 177 01/25/2019 1145   TRIG 70 01/25/2019 1145   HDL 54 01/25/2019 1145   LDLCALC 109 (H) 01/25/2019 1145   Hepatic Function Panel     Component Value Date/Time   PROT 9.5 (H) 05/02/2016 0822   ALBUMIN 4.6 05/02/2016 0822   AST 20 05/02/2016 0822   ALT 22 05/02/2016 0822   ALKPHOS 74 05/02/2016 0822   BILITOT 0.6 05/02/2016 0822      Component Value Date/Time   TSH 0.729 01/25/2019 1145      OBESITY BEHAVIORAL INTERVENTION VISIT  Today's visit was # 2   Starting weight: 272 lbs Starting date: 01/25/2019 Today's weight :273 lbs  Today's date: 02/18/2019 Total lbs lost to  date: 0    ASK: We discussed the diagnosis of obesity with Madison Robertson today and Seerat agreed to give [place] permission to discuss obesity behavioral modification therapy today.  ASSESS: Madison Robertson has the diagnosis of obesity and her BMI today is 41.52 Madison Robertson is in the action stage of change   ADVISE: Madison Robertson was educated on the multiple health risks of obesity as well as the benefit of weight loss to improve her health. She was advised of the need for long term treatment and the importance of lifestyle modifications to improve her current health and to decrease her risk of future health problems.  AGREE: Multiple dietary modification options and treatment options were discussed and  Madison Robertson agreed to follow the recommendations documented in the above note.  ARRANGE: Madison Robertson was educated on the importance of frequent visits to treat obesity as outlined per CMS and USPSTF guidelines and agreed to schedule her next follow up appointment today.  I, Trixie Dredge, am acting as transcriptionist for Ilene Qua, MD  I have reviewed the above documentation for accuracy and completeness, and I agree with the above. - Ilene Qua, MD

## 2019-02-25 MED ORDER — METFORMIN HCL ER 500 MG PO TB24: 500.0000 mg | ORAL_TABLET | Freq: Every day | ORAL | 0 refills | Status: DC

## 2019-02-26 MED FILL — METFORMIN HCL ER 500 MG TB2: 500 | 30 days supply | Qty: 30 | Fill #0

## 2019-03-01 ENCOUNTER — Encounter: Payer: No Typology Code available for payment source | Admitting: Physician Assistant

## 2019-03-03 ENCOUNTER — Encounter (INDEPENDENT_AMBULATORY_CARE_PROVIDER_SITE_OTHER): Payer: Self-pay

## 2019-03-03 NOTE — Progress Notes (Unsigned)
Office: [phone]  /  Fax: [phone]    Date: March 04, 2019   Appointment Start Time:*** Duration:*** Provider: Glennie Isle, Psy.D. Type of Session: Individual Therapy  Location of Patient: *** Location of Provider: [facility] Type of Contact: Telepsychological Visit via [employer] Web[employer]   Session Content: Madison Robertson is a 28 y.o. female presenting via [facility] for a follow-up appointment to address the previously established treatment goal of decreasing emotional eating. Today's appointment was a telepsychological visit, as this provider's clinic is seeing a limited number of patients for in-person visits due to COVID-19. Therapeutic services will resume to in-person appointments once deemed appropriate. Madison Robertson expressed understanding regarding the rationale for telepsychological services, and provided verbal consent for today's appointment. Prior to proceeding with today's appointment, Madison Robertson's physical location at the time of this appointment was obtained. Madison Robertson reported she was at *** and provided the address. In the event of technical difficulties, Madison Robertson shared a phone number she could be reached at. Madison Robertson and this provider participated in today's telepsychological service. Also, Madison Robertson denied anyone else being present in the room or on the WebEx appointment ***.  This provider conducted a brief check-in and verbally administered the PHQ-9 and GAD-7. ***   Madison Robertson was receptive to today's session as evidenced by openness to sharing, responsiveness to feedback, and ***.  Mental Status Examination:  Appearance: {Appearance:22431} Behavior: {Behavior:22445} Mood: {Teletherapy mood:22435} Affect: {Affect:22436} Speech: {Speech:22432} Eye Contact: {Eye Contact:22433} Psychomotor Activity: {Motor Activity:22434} Thought Process: {thought process:22448}  Content/Perceptual Disturbances: {disturbances:22451} Orientation: {Orientation:22437}  Cognition/Sensorium: {gbcognition:22449} Insight: {Insight:22446} Judgment: {Insight:22446}  Structured Assessment Results: The Patient Health Questionnaire-9 (PHQ-9) is a self-report measure that assesses symptoms and severity of depression over the course of the last two weeks. Madison Robertson obtained a score of *** suggesting {GBPHQ9SEVERITY:21752}. Madison Robertson finds the endorsed symptoms to be {gbphq9difficulty:21754}. Madison Robertson ***  Feeling down, depressed, or hopeless ***  Trouble falling or staying asleep, or sleeping too much ***  Feeling tired or having Madison energy ***  Poor appetite or overeating ***  Feeling bad about yourself --- or that you are a failure or have let yourself or your family down ***  Trouble concentrating on Robertson, such as reading the newspaper or watching television ***  Moving or speaking so slowly that other people could have noticed? Or the opposite --- being so fidgety or restless that you have been moving around a lot more than usual ***  Thoughts that you would be better off dead or hurting yourself in some way ***  PHQ-9 Score ***    The Generalized Anxiety Disorder-7 (GAD-7) is a brief self-report measure that assesses symptoms of anxiety over the course of the last two weeks. Madison Robertson obtained a score of *** suggesting {gbgad7severity:21753}. Madison Robertson finds the endorsed symptoms to be {gbphq9difficulty:21754}. Feeling nervous, anxious, on edge ***  Not being able to stop or control worrying ***  Worrying too much about different Robertson ***  Trouble relaxing ***  Being so restless that it's hard to sit still ***  Becoming easily annoyed or irritable ***  Feeling afraid as if something awful might happen ***  GAD-7 Score ***   Interventions:  {Interventions:22172}  DSM-5 Diagnoses: 311 (F32.8) Other Specified Depressive Disorder, Emotional Eating Behaviors 314.01 (F90.9) Unspecified Attention-Deficit/Hyperactivity Disorder   Treatment Goal & Progress: During the initial appointment with this provider, the following treatment goal was established: decrease emotional eating. Madison Robertson has demonstrated progress in her goal as evidenced by ***  Plan: Madison Robertson continues to appear able and willing to participate as evidenced by engagement in reciprocal conversation, and asking questions for clarification as appropriate. The next appointment will be scheduled in {gbweeks:21758}, which will be via [employer]. Once this provider's office resumes in-person appointments and it is deemed appropriate, Madison Robertson will be notified. The next session will focus on reviewing learned skills, and working towards the established treatment goal.***

## 2019-03-04 ENCOUNTER — Ambulatory Visit (INDEPENDENT_AMBULATORY_CARE_PROVIDER_SITE_OTHER): Payer: No Typology Code available for payment source | Admitting: Psychology

## 2019-03-05 ENCOUNTER — Encounter: Payer: Self-pay | Admitting: Physician Assistant

## 2019-03-05 ENCOUNTER — Ambulatory Visit (INDEPENDENT_AMBULATORY_CARE_PROVIDER_SITE_OTHER): Payer: No Typology Code available for payment source | Admitting: Physician Assistant

## 2019-03-05 ENCOUNTER — Other Ambulatory Visit: Payer: Self-pay

## 2019-03-05 VITALS — BP 140/86 | HR 102 | Temp 98.7°F | Ht 67.5 in | Wt 274.0 lb

## 2019-03-05 DIAGNOSIS — Z0001 Encounter for general adult medical examination with abnormal findings: Secondary | ICD-10-CM | POA: Diagnosis not present

## 2019-03-05 DIAGNOSIS — F419 Anxiety disorder, unspecified: Secondary | ICD-10-CM | POA: Diagnosis not present

## 2019-03-05 DIAGNOSIS — E669 Obesity, unspecified: Secondary | ICD-10-CM

## 2019-03-05 DIAGNOSIS — Z20822 Contact with and (suspected) exposure to covid-19: Secondary | ICD-10-CM

## 2019-03-05 DIAGNOSIS — I10 Essential (primary) hypertension: Secondary | ICD-10-CM | POA: Diagnosis not present

## 2019-03-05 DIAGNOSIS — R7303 Prediabetes: Secondary | ICD-10-CM

## 2019-03-05 DIAGNOSIS — M2550 Pain in unspecified joint: Secondary | ICD-10-CM | POA: Diagnosis not present

## 2019-03-05 DIAGNOSIS — Z20828 Contact with and (suspected) exposure to other viral communicable diseases: Secondary | ICD-10-CM

## 2019-03-05 LAB — COMPREHENSIVE METABOLIC PANEL
ALT: 10 U/L (ref 0–35)
AST: 12 U/L (ref 0–37)
Albumin: 4.1 g/dL (ref 3.5–5.2)
Alkaline Phosphatase: 88 U/L (ref 39–117)
BUN: 9 mg/dL (ref 6–23)
CO2: 28 mEq/L (ref 19–32)
Calcium: 9.3 mg/dL (ref 8.4–10.5)
Chloride: 105 mEq/L (ref 96–112)
Creatinine, Ser: 0.74 mg/dL (ref 0.40–1.20)
GFR: 111.34 mL/min (ref >60.00)
Glucose, Bld: 78 mg/dL (ref 70–99)
Potassium: 4.2 mEq/L (ref 3.5–5.1)
Sodium: 140 mEq/L (ref 135–145)
Total Bilirubin: 0.3 mg/dL (ref 0.2–1.2)
Total Protein: 7.1 g/dL (ref 6.0–8.3)

## 2019-03-05 LAB — CBC WITH DIFFERENTIAL/PLATELET
Basophils Absolute: 0 10*3/uL (ref 0.0–0.1)
Basophils Relative: 0.4 % (ref 0.0–3.0)
Eosinophils Absolute: 0.1 10*3/uL (ref 0.0–0.7)
Eosinophils Relative: 1.4 % (ref 0.0–5.0)
HCT: 39.2 % (ref 36.0–46.0)
Hemoglobin: 12.8 g/dL (ref 12.0–15.0)
Lymphocytes Relative: 30 % (ref 12.0–46.0)
Lymphs Abs: 2.7 10*3/uL (ref 0.7–4.0)
MCHC: 32.5 g/dL (ref 30.0–36.0)
MCV: 83.5 fl (ref 78.0–100.0)
Monocytes Absolute: 0.6 10*3/uL (ref 0.1–1.0)
Monocytes Relative: 6.4 % (ref 3.0–12.0)
Neutro Abs: 5.7 10*3/uL (ref 1.4–7.7)
Neutrophils Relative %: 61.8 % (ref 43.0–77.0)
Platelets: 245 10*3/uL (ref 150.0–400.0)
RBC: 4.69 Mil/uL (ref 3.87–5.11)
RDW: 15.7 % — ABNORMAL HIGH (ref 11.5–15.5)
WBC: 9.1 10*3/uL (ref 4.0–10.5)

## 2019-03-05 LAB — SEDIMENTATION RATE: Sed Rate: -8 mm/hr — ABNORMAL LOW (ref 0–20)

## 2019-03-05 LAB — C-REACTIVE PROTEIN: CRP: 1.8 mg/dL (ref 0.5–20.0)

## 2019-03-05 MED ORDER — HYDROXYZINE HCL 25 MG PO TABS: 25.0000 mg | ORAL_TABLET | Freq: Every evening | ORAL | 0 refills | Status: DC | PRN

## 2019-03-05 MED FILL — hydrOXYzine HCL 25 MG TABS: 25 | 30 days supply | Qty: 60 | Fill #0

## 2019-03-05 NOTE — Patient Instructions (Signed)
It was great to see you!  Please go to the lab for blood work.   Our office will call you with your results unless you have chosen to receive results via MyChart.  If your blood work is normal we will follow-up each year for physicals and as scheduled for chronic medical problems.  If anything is abnormal we will treat accordingly and get you in for a follow-up.  Take care,  Madison Robertson  **Trial Atarax prn for your anxiety/insomnia. Keep me posted if this works for you or not.   Health Maintenance, Female Adopting a healthy lifestyle and getting preventive care are important in promoting health and wellness. Ask your health care provider about:  The right schedule for you to have regular tests and exams.  Things you can do on your own to prevent diseases and keep yourself healthy. What should I know about diet, weight, and exercise? Eat a healthy diet   Eat a diet that includes plenty of vegetables, fruits, low-fat dairy products, and lean protein.  Do not eat a lot of foods that are high in solid fats, added sugars, or sodium. Maintain a healthy weight Body mass index (BMI) is used to identify weight problems. It estimates body fat based on height and weight. Your health care provider can help determine your BMI and help you achieve or maintain a healthy weight. Get regular exercise Get regular exercise. This is one of the most important things you can do for your health. Most adults should:  Exercise for at least 150 minutes each week. The exercise should increase your heart rate and make you sweat (moderate-intensity exercise).  Do strengthening exercises at least twice a week. This is in addition to the moderate-intensity exercise.  Spend less time sitting. Even light physical activity can be beneficial. Watch cholesterol and blood lipids Have your blood tested for lipids and cholesterol at 30 years of age, then have this test every 5 years. Have your cholesterol levels  checked more often if:  Your lipid or cholesterol levels are high.  You are older than 30 years of age.  You are at high risk for heart disease. What should I know about cancer screening? Depending on your health history and family history, you may need to have cancer screening at various ages. This may include screening for:  Breast cancer.  Cervical cancer.  Colorectal cancer.  Skin cancer.  Lung cancer. What should I know about heart disease, diabetes, and high blood pressure? Blood pressure and heart disease  High blood pressure causes heart disease and increases the risk of stroke. This is more likely to develop in people who have high blood pressure readings, are of African descent, or are overweight.  Have your blood pressure checked: ? Every 3-5 years if you are 12-11 years of age. ? Every year if you are 61 years old or older. Diabetes Have regular diabetes screenings. This checks your fasting blood sugar level. Have the screening done:  Once every three years after age 31 if you are at a normal weight and have a low risk for diabetes.  More often and at a younger age if you are overweight or have a high risk for diabetes. What should I know about preventing infection? Hepatitis B If you have a higher risk for hepatitis B, you should be screened for this virus. Talk with your health care provider to find out if you are at risk for hepatitis B infection. Hepatitis C Testing is recommended for:  Everyone born from [birth year] through 1967.  Anyone with known risk factors for hepatitis C. Sexually transmitted infections (STIs)  Get screened for STIs, including gonorrhea and chlamydia, if: ? You are sexually active and are younger than 30 years of age. ? You are older than 30 years of age and your health care provider tells you that you are at risk for this type of infection. ? Your sexual activity has changed since you were last screened, and you are at increased risk  for chlamydia or gonorrhea. Ask your health care provider if you are at risk.  Ask your health care provider about whether you are at high risk for HIV. Your health care provider may recommend a prescription medicine to help prevent HIV infection. If you choose to take medicine to prevent HIV, you should first get tested for HIV. You should then be tested every 3 months for as long as you are taking the medicine. Pregnancy  If you are about to stop having your period (premenopausal) and you may become pregnant, seek counseling before you get pregnant.  Take 400 to 800 micrograms (mcg) of folic acid every day if you become pregnant.  Ask for birth control (contraception) if you want to prevent pregnancy. Osteoporosis and menopause Osteoporosis is a disease in which the bones lose minerals and strength with aging. This can result in bone fractures. If you are 53 years old or older, or if you are at risk for osteoporosis and fractures, ask your health care provider if you should:  Be screened for bone loss.  Take a calcium or vitamin D supplement to lower your risk of fractures.  Be given hormone replacement therapy (HRT) to treat symptoms of menopause. Follow these instructions at home: Lifestyle  Do not use any products that contain nicotine or tobacco, such as cigarettes, e-cigarettes, and chewing tobacco. If you need help quitting, ask your health care provider.  Do not use street drugs.  Do not share needles.  Ask your health care provider for help if you need support or information about quitting drugs. Alcohol use  Do not drink alcohol if: ? Your health care provider tells you not to drink. ? You are pregnant, may be pregnant, or are planning to become pregnant.  If you drink alcohol: ? Limit how much you use to 0-1 drink a day. ? Limit intake if you are breastfeeding.  Be aware of how much alcohol is in your drink. In the [place] one drink equals one 12 oz bottle of beer (355  mL), one 5 oz glass of wine (148 mL), or one 1 oz glass of hard liquor (44 mL). General instructions  Schedule regular health, dental, and eye exams.  Stay current with your vaccines.  Tell your health care provider if: ? You often feel depressed. ? You have ever been abused or do not feel safe at home. Summary  Adopting a healthy lifestyle and getting preventive care are important in promoting health and wellness.  Follow your health care provider's instructions about healthy diet, exercising, and getting tested or screened for diseases.  Follow your health care provider's instructions on monitoring your cholesterol and blood pressure. This information is not intended to replace advice given to you by your health care provider. Make sure you discuss any questions you have with your health care provider. Document Released: 01/28/2011 Document Revised: 07/08/2018 Document Reviewed: 07/08/2018 Elsevier Patient Education  2020 [employer].

## 2019-03-05 NOTE — Progress Notes (Signed)
I acted as a Education administrator for [employer], PA-C Anselmo Pickler, LPN  Subjective:    Madison Robertson is a 28 y.o. female and is here for a comprehensive physical exam.  HPI  Health Maintenance Due  Topic Date Due  . HIV Screening  12/15/2003  . INFLUENZA VACCINE  02/27/2019    Acute Concerns: Joint pain/stiffness - over the past several months she has noticed worsening bilateral joint stiffness and pain in hands, wrists, knees, feet. Does have family hx of RA in great aunt. She would like work-up for RA. She takes ibuprofen prn and has recently decreased her intake because she is trying to help protect her stomach. Does get swelling in these joints as well. 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 She is always wearing proper PPE, but she would like COVID-19 antibody testing today. Anxiety -- uncontrolled. Having feelings of panic attacks and some insomnia when her mind is racing. She states that her ADHD doctor told her to never restart SSRIs, but to consider other medications per my discretion. Denies SI/HI.  Chronic Issues: HTN -- Currently taking Lisinopril 20 mg, Lasix 20 mg prn. At home blood pressure readings are: not checked. Patient denies chest pain, SOB, blurred vision, dizziness, unusual headaches, lower leg swelling. Patient is compliant with medication. Denies excessive caffeine intake, stimulant usage, excessive alcohol intake, or increase in salt consumption. Prediabetes -- sees [facility]. They are managing this. She is currently on Metformin 500 mg XR.  Health Maintenance: Immunizations -- UTD Colonoscopy -- N/A Mammogram -- N/A PAP -- Pt scheduling an appt with [facility] Bone Density --N/A Diet -- following high protein diet at [facility] Caffeine intake -- minimal Sleep habits -- poor at times due to anxiety Exercise -- as able Current  Weight -- Weight: 274 lb (124.3 kg)  Weight History: Wt Readings from Last 10 Encounters:  03/05/19 274 lb (124.3 kg)  02/18/19 273 lb (123.8 kg)  01/25/19 272 lb (123.4 kg)  06/29/18 269 lb (122 kg)  03/10/17 262 lb (118.8 kg)  02/25/17 262 lb 12.8 oz (119.2 kg)  05/02/16 245 lb (111.1 kg)  05/19/14 257 lb 3.2 oz (116.7 kg)   Mood -- worsening anxiety, denies depression Patient's last menstrual period was 02/28/2019.   Depression screen PHQ 2/9 03/05/2019  Decreased Interest 1  Down, Depressed, Hopeless 0  PHQ - 2 Score 1  Altered sleeping 3  Tired, decreased energy 1  Change in appetite 3  Feeling bad or failure about yourself  0  Trouble concentrating 0  Moving slowly or fidgety/restless 0  Suicidal thoughts 0  PHQ-9 Score 8  Difficult doing work/chores Somewhat difficult   GAD 7 : Generalized Anxiety Score 03/05/2019 06/29/2018  Nervous, Anxious, on Edge 3 3  Control/stop worrying 1 3  Worry too much - different things 3 3  Trouble relaxing 3 3  Restless 0 2  Easily annoyed or irritable 1 3  Afraid - awful might happen 0 3  Total GAD 7 Score 11 20  Anxiety Difficulty Somewhat difficult Very difficult     Other providers/specialists: Patient Care Team: Inda Coke, [place] as PCP - General (Physician Assistant) Loletha Carrow Kirke Corin, MD as Consulting Physician (Gastroenterology)   PMHx, SurgHx, SocialHx, Medications, and Allergies were reviewed in the Visit Navigator and updated as appropriate.   Past Medical History:  Diagnosis Date  . ADD (  attention deficit disorder)   . Allergy   . Anxiety   . Asthma    childhood asthma - pt out grew  . Depression   . Edema, lower extremity   . Heart murmur   . Hyperlipidemia    Has never been on medication for this  . Hypertension    Lisinopril-HCTZ  . IBS (irritable bowel syndrome)   . Lactose intolerance   . Migraines    well-controlled when blood pressure is controlled  . PCOS (polycystic ovarian syndrome)    10  years; was on metformin for 2 months but didn't like the way it made her feel; has never been on spironolactone  . Pre-diabetes    Hemoglobin A1c 6.0 in September 2019     Past Surgical History:  Procedure Laterality Date  . WISDOM TOOTH EXTRACTION     Feb 2017     Family History  Problem Relation Age of Onset  . Diabetes Mother   . Hypertension Mother   . Colon polyps Mother   . High Cholesterol Mother   . Depression Mother   . Anxiety disorder Mother   . Obesity Mother   . Diabetes Father   . Hypertension Father   . Congestive Heart Failure Father   . Heart disease Father   . Prostate cancer Paternal Grandfather   . Breast cancer Other        Paternal great aunt,distal cousin,   . Ovarian cancer Other        Paternal great grandmother  . Uterine cancer Other   . Colon cancer Maternal Grandfather   . Esophageal cancer Maternal Grandmother   . Esophageal cancer Maternal Uncle   . Pancreatic cancer Neg Hx   . Rectal cancer Neg Hx   . Stomach cancer Neg Hx     Social History   Tobacco Use  . Smoking status: Never Smoker  . Smokeless tobacco: Never Used  Substance Use Topics  . Alcohol use: Yes    Comment: occassionally  . Drug use: No    Review of Systems:   Review of Systems  Constitutional: Negative.  Negative for chills, fever, malaise/fatigue and weight loss.  HENT: Negative.  Negative for hearing loss, sinus pain and sore throat.   Eyes: Negative.  Negative for blurred vision.  Respiratory: Negative.  Negative for cough and shortness of breath.   Cardiovascular: Negative.  Negative for chest pain, palpitations and leg swelling.  Gastrointestinal: Negative.  Negative for abdominal pain, constipation, diarrhea, heartburn, nausea and vomiting.  Genitourinary: Negative.  Negative for dysuria, frequency and urgency.  Musculoskeletal: Positive for joint pain. Negative for back pain, myalgias and neck pain.  Skin: Negative.  Negative for itching and rash.   Neurological: Negative.  Negative for dizziness, tingling, seizures, loss of consciousness and headaches.  Endo/Heme/Allergies: Negative.  Negative for polydipsia.  Psychiatric/Behavioral: Negative for depression. The patient is not nervous/anxious.     Objective:   BP 140/86 (BP Location: Left Arm, Patient Position: Sitting, Cuff Size: Large)   Pulse (!) 102   Temp 98.7 F (37.1 C) (Temporal)   Ht 5' 7.5" (1.715 m)   Wt 274 lb (124.3 kg)   LMP 02/28/2019   SpO2 98%   BMI 42.28 kg/m   General Appearance:    Alert, cooperative, no distress, appears stated age  Head:    Normocephalic, without obvious abnormality, atraumatic  Eyes:    PERRL, conjunctiva/corneas clear, EOM's intact, fundi    benign, both eyes  Ears:  Normal TM's and external ear canals, both ears  Nose:   Nares normal, septum midline, mucosa normal, no drainage    or sinus tenderness  Throat:   Lips, mucosa, and tongue normal; teeth and gums normal  Neck:   Supple, symmetrical, trachea midline, no adenopathy;    thyroid:  no enlargement/tenderness/nodules; no carotid   bruit or JVD  Back:     Symmetric, no curvature, ROM normal, no CVA tenderness  Lungs:     Clear to auscultation bilaterally, respirations unlabored  Chest Wall:    No tenderness or deformity   Heart:    Regular rate and rhythm, S1 and S2 normal, no murmur, rub   or gallop  Breast Exam:    Deferred  Abdomen:     Soft, non-tender, bowel sounds active all four quadrants,    no masses, no organomegaly  Genitalia:    Deferred  Rectal:    Deferred  Extremities:   Extremities normal, atraumatic, no cyanosis or edema  Pulses:   2+ and symmetric all extremities  Skin:   Skin color, texture, turgor normal, no rashes or lesions  Lymph nodes:   Cervical, supraclavicular, and axillary nodes normal  Neurologic:   CNII-XII intact, normal strength, sensation and reflexes    throughout    Assessment/Plan:   Barba was seen today for annual exam.   Diagnoses and all orders for this visit:  Encounter for general adult medical examination with abnormal findings Today patient counseled on age appropriate routine health concerns for screening and prevention, each reviewed and up to date or declined. Immunizations reviewed and up to date or declined. Labs ordered and reviewed. Risk factors for depression reviewed and negative. Hearing function and visual acuity are intact. ADLs screened and addressed as needed. Functional ability and level of safety reviewed and appropriate. Education, counseling and referrals performed based on assessed risks today. Patient provided with a copy of personalized plan for preventive services.  Close Exposure to Covid-19 Virus Discussed limitations of these test results but she verbalized understanding and would like to proceed with testing. -     SAR CoV2 Serology (COVID 19)AB(IGG)IA  Arthralgia, unspecified joint Likely osteoarthritis. She is requesting RA work-up. Will order labs and did discuss that if there are abnormalities, she would be referred to rheumatology. -     CBC with Differential/Platelet -     Comprehensive metabolic panel -     ANA w/Reflex if Positive -     C-reactive protein -     Sedimentation rate -     Rheumatoid factor -     CYCLIC CITRUL PEPTIDE ANTIBODY, IGG/IGA  Anxiety Uncontrolled. Will trial prn atarax. We also discussed possibility of trazodone if atarax doesn't help her sleep. Follow-up prn.  Essential hypertension Well controlled. Continue regimen.  Prediabetes Well controlled. Continue regimen. Managed by [facility].  Obesity  Managed by [facility].  Other orders -     hydrOXYzine (ATARAX/VISTARIL) 25 MG tablet; Take 1 tablet (25 mg total) by mouth at bedtime as needed for anxiety (insomnia). Take one 25 mg tablet 30-60 minutes prior to bedtime for insomnia, anxiety. May increase to two tablets.    Well Adult Exam: Labs ordered:  Yes. Patient counseling was done. See below for items discussed. Discussed the patient's BMI. The BMI is not in the acceptable range; BMI management plan is completed Follow up in 3 months.  Patient Counseling:   [x]     Nutrition: Stressed importance of  moderation in sodium/caffeine intake, saturated fat and cholesterol, caloric balance, sufficient intake of fresh fruits, vegetables, fiber, calcium, iron, and 1 mg of folate supplement per day (for females capable of pregnancy).   [x]      Stressed the importance of regular exercise.    [x]     Substance Abuse: Discussed cessation/primary prevention of tobacco, alcohol, or other drug use; driving or other dangerous activities under the influence; availability of treatment for abuse.    [x]      Injury prevention: Discussed safety belts, safety helmets, smoke detector, smoking near bedding or upholstery.    []      Sexuality: Discussed sexually transmitted diseases, partner selection, use of condoms, avoidance of unintended pregnancy  and contraceptive alternatives.    [x]     Dental health: Discussed importance of regular tooth brushing, flossing, and dental visits.   [x]      Health maintenance and immunizations reviewed. Please refer to Health maintenance section.   CMA or LPN served as scribe during this visit. History, Physical, and Plan performed by medical provider. The above documentation has been reviewed and is accurate and complete.   Inda Coke, PA-C [place]

## 2019-03-06 LAB — RHEUMATOID FACTOR: Rheumatoid fact SerPl-aCnc: <14 IU/mL (ref <14)

## 2019-03-06 LAB — SAR COV2 SEROLOGY (COVID19)AB(IGG),IA: SARS CoV2 AB IGG: NEGATIVE

## 2019-03-07 LAB — CYCLIC CITRUL PEPTIDE ANTIBODY, IGG/IGA: Cyclic Citrullin Peptide Ab: 4 units (ref 0–19)

## 2019-03-07 LAB — ANA W/REFLEX IF POSITIVE: Anti Nuclear Antibody (ANA): NEGATIVE

## 2019-03-08 ENCOUNTER — Ambulatory Visit (INDEPENDENT_AMBULATORY_CARE_PROVIDER_SITE_OTHER): Payer: No Typology Code available for payment source | Admitting: Family Medicine

## 2019-03-10 ENCOUNTER — Encounter (INDEPENDENT_AMBULATORY_CARE_PROVIDER_SITE_OTHER): Payer: Self-pay

## 2019-03-11 ENCOUNTER — Encounter (INDEPENDENT_AMBULATORY_CARE_PROVIDER_SITE_OTHER): Payer: Self-pay

## 2019-03-22 ENCOUNTER — Other Ambulatory Visit: Payer: Self-pay | Admitting: Physician Assistant

## 2019-03-22 MED FILL — LISINOPRIL 20 MG TABLET: 20 | 30 days supply | Qty: 30 | Fill #0

## 2019-03-31 ENCOUNTER — Telehealth (INDEPENDENT_AMBULATORY_CARE_PROVIDER_SITE_OTHER): Payer: Self-pay | Admitting: Psychology

## 2019-03-31 NOTE — Telephone Encounter (Signed)
  Office: [phone]  /  Fax: [phone]  Date of Call: March 31, 2019  Time of Call: 8:40am Provider: Glennie Isle, PsyD  CONTENT:  This provider called [facility] to check-in and schedule a follow-up appointment. A HIPAA compliant voicemail was left requesting a call back.   PLAN:  This provider will wait for Anaria to call back. No further follow-up planned by this provider.

## 2019-04-09 MED FILL — CONCERTA 36 MG TABLET ER: 36 | 30 days supply | Qty: 30 | Fill #0

## 2019-04-09 MED FILL — METHYLPHENIDATE HCL 20 MG T: 20 | 30 days supply | Qty: 30 | Fill #0

## 2019-04-10 ENCOUNTER — Other Ambulatory Visit: Payer: Self-pay | Admitting: Physician Assistant

## 2019-04-12 ENCOUNTER — Other Ambulatory Visit: Payer: Self-pay | Admitting: *Deleted

## 2019-04-12 MED ORDER — HYDROXYZINE HCL 25 MG PO TABS: 25.0000 mg | ORAL_TABLET | Freq: Every evening | ORAL | 0 refills | Status: DC | PRN

## 2019-04-12 MED ORDER — HYDROXYZINE HCL 25 MG PO TABS: ORAL_TABLET | ORAL | 0 refills | Status: DC

## 2019-04-12 MED FILL — hydrOXYzine HCL 25 MG TABS: 25 | 30 days supply | Qty: 60 | Fill #0

## 2019-04-16 MED FILL — TRIAMCINOLONE 0.1% CREAM: 0.1 | 30 days supply | Qty: 30 | Fill #1

## 2019-04-24 MED FILL — DICYCLOMINE 20 MG TABLET: 20 | 10 days supply | Qty: 30 | Fill #1

## 2019-04-30 MED FILL — LISINOPRIL 20 MG TABLET: 20 | 30 days supply | Qty: 30 | Fill #0

## 2019-05-12 ENCOUNTER — Encounter: Payer: Self-pay | Admitting: Physician Assistant

## 2019-05-12 MED FILL — METHYLPHENIDATE HCL 20 MG T: 20 | 30 days supply | Qty: 30 | Fill #0

## 2019-05-12 MED FILL — CONCERTA 36 MG TABLET ER: 36 | 30 days supply | Qty: 30 | Fill #0

## 2019-05-13 ENCOUNTER — Other Ambulatory Visit: Payer: Self-pay | Admitting: Physician Assistant

## 2019-05-13 DIAGNOSIS — K589 Irritable bowel syndrome without diarrhea: Secondary | ICD-10-CM

## 2019-05-29 ENCOUNTER — Other Ambulatory Visit: Payer: Self-pay | Admitting: Physician Assistant

## 2019-05-30 ENCOUNTER — Encounter: Payer: Self-pay | Admitting: Physician Assistant

## 2019-05-30 MED FILL — LISINOPRIL 20 MG TABLET: 20 | 30 days supply | Qty: 30 | Fill #1

## 2019-05-31 MED ORDER — HYDROXYZINE HCL 25 MG PO TABS: ORAL_TABLET | ORAL | 0 refills | Status: DC

## 2019-05-31 MED ORDER — NORGESTIMATE-ETH ESTRADIOL 0.25-35 MG-MCG PO TABS: 1.0000 | ORAL_TABLET | Freq: Every day | ORAL | 5 refills | Status: DC

## 2019-05-31 MED FILL — hydrOXYzine HCL 25 MG TABS: 25 | 30 days supply | Qty: 60 | Fill #0

## 2019-05-31 MED FILL — NORGESTIMATE-ETH ESTRADIOL: 0.25-35 | 28 days supply | Qty: 28 | Fill #0

## 2019-06-15 MED FILL — CONCERTA 36 MG TABLET ER: 36 | 30 days supply | Qty: 30 | Fill #0

## 2019-06-15 MED FILL — METHYLPHENIDATE HCL 20 MG T: 20 | 30 days supply | Qty: 30 | Fill #0

## 2019-06-17 ENCOUNTER — Other Ambulatory Visit: Payer: Self-pay | Admitting: Physician Assistant

## 2019-06-17 MED FILL — ONDANSETRON HCL 4 MG TABLET: 4 | 7 days supply | Qty: 30 | Fill #0

## 2019-06-17 MED FILL — DICYCLOMINE 20 MG TABLET: 20 | 10 days supply | Qty: 30 | Fill #0

## 2019-06-29 ENCOUNTER — Other Ambulatory Visit: Payer: Self-pay | Admitting: Physician Assistant

## 2019-06-29 MED FILL — TRIAMCINOLONE 0.1% CREAM: 0.1 | 30 days supply | Qty: 30 | Fill #0

## 2019-06-29 MED FILL — LISINOPRIL 20 MG TABLET: 20 | 30 days supply | Qty: 30 | Fill #0

## 2019-06-29 MED FILL — NORGESTIMATE-ETH ESTRADIOL: 0.25-35 | 28 days supply | Qty: 28 | Fill #1

## 2019-07-20 MED FILL — METHYLPHENIDATE HCL 20 MG T: 20 | 30 days supply | Qty: 30 | Fill #0

## 2019-07-20 MED FILL — CONCERTA 36 MG TABLET ER: 36 | 30 days supply | Qty: 30 | Fill #0

## 2019-07-28 MED FILL — LISINOPRIL 20 MG TABLET: 20 | 30 days supply | Qty: 30 | Fill #1

## 2019-07-28 MED FILL — NORGESTIMATE-ETH ESTRADIOL: 0.25-35 | 28 days supply | Qty: 28 | Fill #2

## 2019-08-17 MED FILL — CONCERTA 36 MG TABLET ER: 36 | 30 days supply | Qty: 30 | Fill #0

## 2019-08-17 MED FILL — METHYLPHENIDATE HCL 20 MG T: 20 | 30 days supply | Qty: 30 | Fill #0

## 2019-08-18 ENCOUNTER — Other Ambulatory Visit: Payer: Self-pay | Admitting: Physician Assistant

## 2019-08-18 ENCOUNTER — Encounter: Payer: Self-pay | Admitting: Physician Assistant

## 2019-08-18 DIAGNOSIS — R7303 Prediabetes: Secondary | ICD-10-CM

## 2019-08-18 MED ORDER — METFORMIN HCL ER 500 MG PO TB24: 500.0000 mg | ORAL_TABLET | Freq: Every day | ORAL | 0 refills | Status: DC

## 2019-08-18 MED FILL — metFORMIN HCL ER 500 MG TB2: 500 | 90 days supply | Qty: 90 | Fill #0

## 2019-08-18 NOTE — Telephone Encounter (Signed)
Please see message and advise if pt needs an appointment. ?

## 2019-08-28 ENCOUNTER — Telehealth: Payer: No Typology Code available for payment source | Admitting: Family

## 2019-08-28 DIAGNOSIS — Z20822 Contact with and (suspected) exposure to covid-19: Secondary | ICD-10-CM | POA: Diagnosis not present

## 2019-08-28 NOTE — Progress Notes (Signed)
E-Visit for Corona Virus Screening  Your current symptoms could be consistent with the coronavirus.  Many health care providers can now test patients at their office but not all are.  [place] has multiple testing sites. For information on our [place] testing locations and hours go to [email or web address]  We are enrolling you in our [place] for [place] . Daily you will receive a questionnaire within the [place] [facility] website. Our COVID 19 response team will be monitoring your responses daily.  Testing Information: The COVID-19 Community Testing sites will begin testing BY APPOINTMENT ONLY.  You can schedule online at [email or web address]  If you do not have access to a smart phone or computer you may call [phone] for an appointment.   Additional testing sites in the Community:  . For [facility] Testing sites in [facility]  FaceUpdate.uy  . For Pop-up testing sites in [place]  BowlDirectory.co.uk  . For Testing sites with regular hours https://onsms.org/[place]/  . For [place] [email or web address]  . For [facility] [email or web address]  . For [facility] testing in [place] and [employer] [email or web address]  . For [facility] testing in [facility]   [email or web address]  For  more information about community testing call [phone]   Please quarantine yourself while awaiting your test results. Please stay home for a minimum of 10 days from the first day of illness with improving symptoms and you have had 24 hours of no fever (without the use of Tylenol (Acetaminophen)  Motrin (Ibuprofen) or any fever reducing medication).  Also - Do not get tested prior to returning to work because once you have had a positive test the test can stay positive for more then a month in some cases.   You should wear a mask or cloth face covering over your nose and mouth if you must be around other people or animals, including pets (even at home). Try to stay at least 6 feet away from other people. This will protect the people around you.  Please continue good preventive care measures, including:  frequent hand-washing, avoid touching your face, cover coughs/sneezes, stay out of crowds and keep a 6 foot distance from others.  COVID-19 is a respiratory illness with symptoms that are similar to the flu. Symptoms are typically mild to moderate, but there have been cases of severe illness and death due to the virus.   The following symptoms may appear 2-14 days after exposure: . Fever . Cough . Shortness of breath or difficulty breathing . Chills . Repeated shaking with chills . Muscle pain . Headache . Sore throat . New loss of taste or smell . Fatigue . Congestion or runny nose . Nausea or vomiting . Diarrhea  Go to the nearest hospital ED for assessment if fever/cough/breathlessness are severe or illness seems like a threat to life.  It is vitally important that if you feel that you have an infection such as this virus or any other virus that you stay home and away from places where you may spread it to others.  You should avoid contact with people age 43 and older.     You may also take acetaminophen (Tylenol) as needed for fever.  Reduce your risk of any infection by using the same precautions used for avoiding the common cold or flu:  Marland Kitchen Wash your hands often with soap and warm water for at least 20 seconds.  If soap and water are not readily available, use an alcohol-based hand sanitizer with  at least 60% alcohol.  . If coughing or sneezing, cover your mouth and nose by  coughing or sneezing into the elbow areas of your shirt or coat, into a tissue or into your sleeve (not your hands). . Avoid shaking hands with others and consider head nods or verbal greetings only. . Avoid touching your eyes, nose, or mouth with unwashed hands.  . Avoid close contact with people who are sick. . Avoid places or events with large numbers of people in one location, like concerts or sporting events. . Carefully consider travel plans you have or are making. . If you are planning any travel outside or inside the [place], visit the CDC's Travelers' Health webpage for the latest health notices. . If you have some symptoms but not all symptoms, continue to monitor at home and seek medical attention if your symptoms worsen. . If you are having a medical emergency, call 911.  HOME CARE . Only take medications as instructed by your medical team. . Drink plenty of fluids and get plenty of rest. . A steam or ultrasonic humidifier can help if you have congestion.   GET HELP RIGHT AWAY IF YOU HAVE EMERGENCY WARNING SIGNS** FOR COVID-19. If you or someone is showing any of these signs seek emergency medical care immediately. Call 911 or proceed to your closest emergency facility if: . You develop worsening high fever. . Trouble breathing . Bluish lips or face . Persistent pain or pressure in the chest . New confusion . Inability to wake or stay awake . You cough up blood. . Your symptoms become more severe  **This list is not all possible symptoms. Contact your medical provider for any symptoms that are sever or concerning to you.  MAKE SURE YOU   Understand these instructions.  Will watch your condition.  Will get help right away if you are not doing well or get worse.  Your e-visit answers were reviewed by a board certified advanced clinical practitioner to complete your personal care plan.  Depending on the condition, your plan could have included both over the counter or  prescription medications.  If there is a problem please reply once you have received a response from your provider.  Your safety is important to [place].  If you have drug allergies check your prescription carefully.    You can use MyChart to ask questions about today's visit, request a non-urgent call back, or ask for a work or school excuse for 24 hours related to this e-Visit. If it has been greater than 24 hours you will need to follow up with your provider, or enter a new e-Visit to address those concerns. You will get an e-mail in the next two days asking about your experience.  I hope that your e-visit has been valuable and will speed your recovery. Thank you for using e-visits.   Approximately 5 minutes was spent documenting and reviewing patient's chart.

## 2019-09-01 ENCOUNTER — Other Ambulatory Visit: Payer: Self-pay | Admitting: Physician Assistant

## 2019-09-01 MED FILL — NORGESTIMATE-ETH ESTRADIOL: 0.25-35 | 28 days supply | Qty: 28 | Fill #3

## 2019-09-01 MED FILL — LISINOPRIL 20 MG TABLET: 20 | 30 days supply | Qty: 30 | Fill #0

## 2019-09-01 MED FILL — FUROSEMIDE 20 MG TABS: 20 | 30 days supply | Qty: 30 | Fill #0

## 2019-09-14 ENCOUNTER — Encounter: Payer: Self-pay | Admitting: Physician Assistant

## 2019-09-14 DIAGNOSIS — F419 Anxiety disorder, unspecified: Secondary | ICD-10-CM

## 2019-09-14 DIAGNOSIS — F988 Other specified behavioral and emotional disorders with onset usually occurring in childhood and adolescence: Secondary | ICD-10-CM

## 2019-09-14 DIAGNOSIS — F329 Major depressive disorder, single episode, unspecified: Secondary | ICD-10-CM

## 2019-09-14 DIAGNOSIS — F32A Depression, unspecified: Secondary | ICD-10-CM

## 2019-09-14 MED FILL — DICYCLOMINE 20 MG TABLET: 20 | 10 days supply | Qty: 30 | Fill #1

## 2019-09-14 MED FILL — TRIAMCINOLONE ACETONIDE 0.1: 0.1 | 30 days supply | Qty: 30 | Fill #1

## 2019-09-28 MED FILL — ADHANSIA XR 55 MG CP24: 55 | 30 days supply | Qty: 30 | Fill #0

## 2019-09-28 MED FILL — VYLIBRA 0.25-35 MG-MCG TABS: 0.25-35 | 28 days supply | Qty: 28 | Fill #4

## 2019-10-11 MED FILL — LISINOPRIL 20 MG TABLET: 20 | 30 days supply | Qty: 30 | Fill #1

## 2019-10-27 MED FILL — ADHANSIA XR 55 MG CP24: 55 | 30 days supply | Qty: 30 | Fill #0

## 2019-10-27 MED FILL — VYLIBRA 0.25-35 MG-MCG TABS: 0.25-35 | 28 days supply | Qty: 28 | Fill #5

## 2019-11-05 ENCOUNTER — Ambulatory Visit (INDEPENDENT_AMBULATORY_CARE_PROVIDER_SITE_OTHER): Payer: No Typology Code available for payment source | Admitting: Physician Assistant

## 2019-11-05 ENCOUNTER — Other Ambulatory Visit: Payer: Self-pay

## 2019-11-05 ENCOUNTER — Encounter: Payer: Self-pay | Admitting: Physician Assistant

## 2019-11-05 VITALS — BP 110/84 | HR 112 | Temp 98.2°F | Ht 67.5 in | Wt 276.5 lb

## 2019-11-05 DIAGNOSIS — E7849 Other hyperlipidemia: Secondary | ICD-10-CM

## 2019-11-05 DIAGNOSIS — I10 Essential (primary) hypertension: Secondary | ICD-10-CM | POA: Diagnosis not present

## 2019-11-05 DIAGNOSIS — E282 Polycystic ovarian syndrome: Secondary | ICD-10-CM | POA: Diagnosis not present

## 2019-11-05 DIAGNOSIS — R7303 Prediabetes: Secondary | ICD-10-CM | POA: Diagnosis not present

## 2019-11-05 LAB — LIPID PANEL
Cholesterol: 202 mg/dL — ABNORMAL HIGH (ref 0–200)
HDL: 47.5 mg/dL (ref >39.00)
LDL Cholesterol: 127 mg/dL — ABNORMAL HIGH (ref 0–99)
NonHDL: 154.52
Total CHOL/HDL Ratio: 4
Triglycerides: 139 mg/dL (ref 0.0–149.0)
VLDL: 27.8 mg/dL (ref 0.0–40.0)

## 2019-11-05 LAB — HEMOGLOBIN A1C: Hgb A1c MFr Bld: 5.9 % (ref 4.6–6.5)

## 2019-11-05 NOTE — Patient Instructions (Signed)
It was great to see you!  You will be contacted about your referrals to cardiology and endocrinology.  Let's follow-up in 6 months, sooner if you have concerns.  Take care,  Inda Coke PA-C

## 2019-11-05 NOTE — Addendum Note (Signed)
Addended by: Francis Dowse T on: 11/05/2019 02:15 PM   Modules accepted: Orders

## 2019-11-05 NOTE — Progress Notes (Signed)
Madison Robertson is a 29 y.o. female is here to discuss: Hypertension  I acted as a Education administrator for [employer], PA-C Anselmo Pickler, LPN  History of Present Illness:   Chief Complaint  Patient presents with  . Hypertension    HPI   Hypertension Pt here today to follow up, she is checking blood pressure twice a day. The past week has been elevated 140/92-96. Currently taking Lisinopril 20 mg daily. Pt denies headaches, dizziness, blurred vision, chest pain, SOB or lower leg edema. Denies excessive caffeine intake, stimulant usage, excessive alcohol intake or increase in salt consumption.  Does take lasix about once a week.  Did have some dizziness associated with the COVID vaccine. This resolved.  Does have fluctuations in her heart rate -- having heart rate elevations >120 (Apple Watch notifying her) at inconsistent times throughout the day -- gets an alert on her Apple Watch at least daily regarding this. She does not develop symptoms with this. Father recently passed from MI. He had CHF, HTN, CKD, afib -- she reports that he was non-compliant with this care.  BP Readings from Last 3 Encounters:  11/05/19 110/84  03/05/19 140/86  02/18/19 137/81   Prediabetes/Obesity/PCOS Has had weight gain from COVID-19 pandemic lifestyle. She is currently taking Metformin 500 mg XR daily and tolerating well. Last A1c was 6.1%. She would like this rechecked today. She is also interested in seeing an endocrinologist for PCOS.   Wt Readings from Last 15 Encounters:  11/05/19 276 lb 8 oz (125.4 kg)  03/05/19 274 lb (124.3 kg)  02/18/19 273 lb (123.8 kg)  01/25/19 272 lb (123.4 kg)  06/29/18 269 lb (122 kg)  03/10/17 262 lb (118.8 kg)  02/25/17 262 lb 12.8 oz (119.2 kg)  05/02/16 245 lb (111.1 kg)  05/19/14 257 lb 3.2 oz (116.7 kg)     Health Maintenance Due  Topic Date Due  . HIV Screening  Never done  . PAP SMEAR-Modifier  05/20/2017    Past Medical History:  Diagnosis Date  .  ADD (attention deficit disorder)   . Allergy   . Anxiety   . Asthma    childhood asthma - pt out grew  . Depression   . Edema, lower extremity   . Heart murmur   . Hyperlipidemia    Has never been on medication for this  . Hypertension    Lisinopril-HCTZ  . IBS (irritable bowel syndrome)   . Lactose intolerance   . Migraines    well-controlled when blood pressure is controlled  . PCOS (polycystic ovarian syndrome)    10 years; was on metformin for 2 months but didn't like the way it made her feel; has never been on spironolactone  . Pre-diabetes    Hemoglobin A1c 6.0 in September 2019     Social History   Socioeconomic History  . Marital status: Single    Spouse name: Not on file  . Number of children: Not on file  . Years of education: Not on file  . Highest education level: Not on file  Occupational History  . Occupation: Medical Assist  Tobacco Use  . Smoking status: Never Smoker  . Smokeless tobacco: Never Used  Substance and Sexual Activity  . Alcohol use: Yes    Comment: occassionally  . Drug use: No  . Sexual activity: Not Currently    Birth control/protection: Pill    Comment: February 25, 2017 last cycle  Other Topics Concern  . 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 Difficulty of Paying Living Expenses:   Food Insecurity:   . Worried About Charity fundraiser in the Last Year:   . Arboriculturist in the Last Year:   Transportation Needs:   . Film/video editor (Medical):   Marland Kitchen Lack of Transportation (Non-Medical):   Physical Activity:   . Days of Exercise per Week:   . Minutes of Exercise per Session:   Stress:   . Feeling of Stress :   Social Connections:   . Frequency of Communication with Friends and Family:   . Frequency of Social Gatherings with Friends and Family:   . Attends Religious Services:   .  Active Member of Clubs or Organizations:   . Attends Archivist Meetings:   Marland Kitchen Marital Status:   Intimate Partner Violence:   . Fear of Current or Ex-Partner:   . Emotionally Abused:   Marland Kitchen Physically Abused:   . Sexually Abused:     Past Surgical History:  Procedure Laterality Date  . WISDOM TOOTH EXTRACTION     Feb 2017    Family History  Problem Relation Age of Onset  . Diabetes Mother   . Hypertension Mother   . Colon polyps Mother   . High Cholesterol Mother   . Depression Mother   . Anxiety disorder Mother   . Obesity Mother   . Diabetes Father   . Hypertension Father   . Congestive Heart Failure Father   . Heart disease Father   . Prostate cancer Paternal Grandfather   . Breast cancer Other        Paternal great aunt,distal cousin,   . Ovarian cancer Other        Paternal great grandmother  . Uterine cancer Other   . Colon cancer Maternal Grandfather   . Esophageal cancer Maternal Grandmother   . Esophageal cancer Maternal Uncle   . Pancreatic cancer Neg Hx   . Rectal cancer Neg Hx   . Stomach cancer Neg Hx     PMHx, SurgHx, SocialHx, FamHx, Medications, and Allergies were reviewed in the Visit Navigator and updated as appropriate.   Patient Active Problem List   Diagnosis Date Noted  . ADD (attention deficit disorder) 06/29/2018  . Anxiety 06/29/2018  . Depression 06/29/2018  . High blood pressure 06/29/2018  . IBS (irritable bowel syndrome) 06/29/2018  . PCOS (polycystic ovarian syndrome) 06/29/2018    Social History   Tobacco Use  . Smoking status: Never Smoker  . Smokeless tobacco: Never Used  Substance Use Topics  . Alcohol use: Yes    Comment: occassionally  . Drug use: No    Current Medications and Allergies:    Current Outpatient Medications:  .  ADHANSIA XR 55 MG CP24, Take 1 capsule by mouth daily., Disp: , Rfl:  .  aluminum chloride (DRYSOL) 20 % external solution, Apply topically at bedtime., Disp: 35 mL, Rfl: 1 .   dicyclomine (BENTYL) 20 MG tablet, TAKE 1 TABLET BY MOUTH 3 TIMES DAILY AS NEEDED FOR SPASMS., Disp: 30 tablet, Rfl: 1 .  furosemide (LASIX) 20 MG tablet, TAKE 1 TABLET (20 MG TOTAL) BY MOUTH DAILY AS NEEDED FOR FLUID., Disp: 30 tablet, Rfl: 1 .  ibuprofen (ADVIL,MOTRIN) 200 MG tablet, Take 400 mg by mouth every 6 (six) hours  as needed for fever, headache, moderate pain or cramping. , Disp: , Rfl:  .  lisinopril (ZESTRIL) 20 MG tablet, TAKE 1 TABLET BY MOUTH ONCE DAILY, Disp: 30 tablet, Rfl: 1 .  metFORMIN (GLUCOPHAGE XR) 500 MG 24 hr tablet, Take 1 tablet (500 mg total) by mouth daily with breakfast., Disp: 90 tablet, Rfl: 0 .  norgestimate-ethinyl estradiol (SPRINTEC 28) 0.25-35 MG-MCG tablet, Take 1 tablet by mouth daily., Disp: 1 Package, Rfl: 5 .  ondansetron (ZOFRAN) 4 MG tablet, TAKE 1 TABLET BY MOUTH EVERY 6 HOURS., Disp: 30 tablet, Rfl: 1 .  potassium chloride SA (K-DUR) 20 MEQ tablet, Take 1 tablet every time you take a lasix tablet., Disp: 30 tablet, Rfl: 0 .  PROMETHEGAN 25 MG suppository, Place 25 mg rectally daily as needed for nausea or vomiting. , Disp: , Rfl: 0 .  triamcinolone cream (KENALOG) 0.1 %, APPLY 1 APPLICATION TOPICALLY DAILY AS NEEDED FOR ECZEMA, Disp: 30 g, Rfl: 1  Current Facility-Administered Medications:  .  0.9 %  sodium chloride infusion, 500 mL, Intravenous, Continuous, Danis, Kirke Corin, MD   Allergies  Allergen Reactions  . Hydrocodone Nausea And Vomiting    Review of Systems   ROS  Negative unless otherwise specified per HPI.  Vitals:   Vitals:   11/05/19 0918  BP: 110/84  Pulse: (!) 112  Temp: 98.2 F (36.8 C)  TempSrc: Temporal  SpO2: 99%  Weight: 276 lb 8 oz (125.4 kg)  Height: 5' 7.5" (1.715 m)     Body mass index is 42.67 kg/m.   Physical Exam:    Physical Exam Vitals and nursing note reviewed.  Constitutional:      General: She is not in acute distress.    Appearance: She is well-developed. She is not ill-appearing or  toxic-appearing.  Cardiovascular:     Rate and Rhythm: Normal rate and regular rhythm.     Pulses: Normal pulses.     Heart sounds: Normal heart sounds, S1 normal and S2 normal.     Comments: No LE edema Pulmonary:     Effort: Pulmonary effort is normal.     Breath sounds: Normal breath sounds.  Skin:    General: Skin is warm and dry.  Neurological:     Mental Status: She is alert.     GCS: GCS eye subscore is 4. GCS verbal subscore is 5. GCS motor subscore is 6.  Psychiatric:        Speech: Speech normal.        Behavior: Behavior normal. Behavior is cooperative.      Assessment and Plan:    Sonjia was seen today for hypertension.  Diagnoses and all orders for this visit:  Other hyperlipidemia; Essential hypertension Blood pressure controlled in our office today. Update lipid panel. Referral to cardiology placed per patient request. -     Lipid panel -     Ambulatory referral to Cardiology  PCOS (polycystic ovarian syndrome); Prediabetes Will update HgbA1c. We did discuss addition of possible GLP-1 if blood sugars allow, to help with weight loss. She is agreeable to this. Will also refer to endocrinology per patient request. -     Hemoglobin A1c; Future -     Ambulatory referral to Endocrinology   . Reviewed expectations re: course of current medical issues. . Discussed self-management of symptoms. . Outlined signs and symptoms indicating need for more acute intervention. . Patient verbalized understanding and all questions were answered. . See orders for this visit as  documented in the electronic medical record. . Patient received an After Visit Summary.  CMA or LPN served as scribe during this visit. History, Physical, and Plan performed by medical provider. The above documentation has been reviewed and is accurate and complete.   Inda Coke, PA-C [place] [place] 11/05/2019  Follow-up: No follow-ups on file.

## 2019-11-08 ENCOUNTER — Encounter: Payer: Self-pay | Admitting: Physician Assistant

## 2019-11-08 NOTE — Telephone Encounter (Signed)
Please advise 

## 2019-11-12 ENCOUNTER — Other Ambulatory Visit: Payer: Self-pay | Admitting: Physician Assistant

## 2019-11-12 MED FILL — LISINOPRIL 20 MG TABLET: 20 | 30 days supply | Qty: 30 | Fill #0

## 2019-11-12 MED FILL — ONDANSETRON HCL 4 MG TABLET: 4 | 8 days supply | Qty: 30 | Fill #1

## 2019-11-12 MED FILL — DICYCLOMINE 20 MG TABLET: 20 | 10 days supply | Qty: 30 | Fill #0

## 2019-11-16 ENCOUNTER — Other Ambulatory Visit: Payer: Self-pay | Admitting: Physician Assistant

## 2019-11-16 ENCOUNTER — Encounter: Payer: Self-pay | Admitting: General Practice

## 2019-11-16 MED ORDER — TRULICITY 0.75 MG/0.5ML ~~LOC~~ SOAJ: 0.7500 mg | SUBCUTANEOUS | 3 refills | Status: DC

## 2019-11-16 MED FILL — TRULICITY 0.75 MG/0.5 ML PE: 0.75 | 28 days supply | Qty: 2 | Fill #0

## 2019-11-26 MED FILL — ADHANSIA XR 55 MG CP24: 55 | 30 days supply | Qty: 30 | Fill #0

## 2019-11-29 ENCOUNTER — Other Ambulatory Visit: Payer: Self-pay | Admitting: Physician Assistant

## 2019-11-29 MED FILL — VYLIBRA 0.25-35 MG-MCG TABS: 0.25-35 | 28 days supply | Qty: 28 | Fill #0

## 2019-12-31 ENCOUNTER — Other Ambulatory Visit: Payer: Self-pay | Admitting: Physician Assistant

## 2019-12-31 MED FILL — TRIAMCINOLONE ACETONIDE 0.1: 0.1 | 30 days supply | Qty: 30 | Fill #0

## 2019-12-31 MED FILL — ADHANSIA XR 55 MG CP24: 55 | 30 days supply | Qty: 30 | Fill #0

## 2019-12-31 MED FILL — VYLIBRA 0.25-35 MG-MCG TABS: 0.25-35 | 28 days supply | Qty: 28 | Fill #1

## 2020-01-05 ENCOUNTER — Other Ambulatory Visit: Payer: Self-pay | Admitting: Physician Assistant

## 2020-01-05 MED ORDER — TRIAMCINOLONE ACETONIDE 0.1 % EX CREA: TOPICAL_CREAM | CUTANEOUS | 1 refills | Status: DC

## 2020-01-13 ENCOUNTER — Encounter: Payer: Self-pay | Admitting: Physician Assistant

## 2020-01-13 ENCOUNTER — Other Ambulatory Visit: Payer: Self-pay | Admitting: Physician Assistant

## 2020-01-13 DIAGNOSIS — K589 Irritable bowel syndrome without diarrhea: Secondary | ICD-10-CM

## 2020-01-17 MED FILL — TRULICITY 0.75 MG/0.5 ML PE: 0.75 | 28 days supply | Qty: 2 | Fill #2

## 2020-01-25 ENCOUNTER — Telehealth: Payer: No Typology Code available for payment source | Admitting: Nurse Practitioner

## 2020-01-25 DIAGNOSIS — R110 Nausea: Secondary | ICD-10-CM | POA: Diagnosis not present

## 2020-01-25 MED ORDER — ONDANSETRON HCL 4 MG PO TABS: 4.0000 mg | ORAL_TABLET | Freq: Four times a day (QID) | ORAL | 1 refills | Status: DC

## 2020-01-25 NOTE — Progress Notes (Signed)

## 2020-01-26 MED FILL — ONDANSETRON HCL 4 MG TABS: 4 | 7 days supply | Qty: 30 | Fill #0

## 2020-02-04 MED FILL — ADHANSIA XR 55 MG CP24: 55 | 30 days supply | Qty: 30 | Fill #0

## 2020-02-05 MED FILL — VYLIBRA 0.25-35 MG-MCG TABS: 0.25-35 | 28 days supply | Qty: 28 | Fill #2

## 2020-02-07 ENCOUNTER — Other Ambulatory Visit: Payer: Self-pay | Admitting: Family Medicine

## 2020-02-07 ENCOUNTER — Encounter: Payer: Self-pay | Admitting: Cardiovascular Disease

## 2020-02-07 ENCOUNTER — Ambulatory Visit (INDEPENDENT_AMBULATORY_CARE_PROVIDER_SITE_OTHER): Payer: No Typology Code available for payment source | Admitting: Cardiovascular Disease

## 2020-02-07 ENCOUNTER — Other Ambulatory Visit: Payer: Self-pay

## 2020-02-07 VITALS — BP 128/82 | HR 109 | Ht 67.0 in | Wt 276.0 lb

## 2020-02-07 DIAGNOSIS — R000 Tachycardia, unspecified: Secondary | ICD-10-CM | POA: Diagnosis not present

## 2020-02-07 DIAGNOSIS — R609 Edema, unspecified: Secondary | ICD-10-CM | POA: Diagnosis not present

## 2020-02-07 DIAGNOSIS — R002 Palpitations: Secondary | ICD-10-CM

## 2020-02-07 DIAGNOSIS — E7800 Pure hypercholesterolemia, unspecified: Secondary | ICD-10-CM | POA: Diagnosis not present

## 2020-02-07 DIAGNOSIS — Z5181 Encounter for therapeutic drug level monitoring: Secondary | ICD-10-CM

## 2020-02-07 HISTORY — DX: Morbid (severe) obesity due to excess calories: E66.01

## 2020-02-07 HISTORY — DX: Palpitations: R00.2

## 2020-02-07 HISTORY — DX: Pure hypercholesterolemia, unspecified: E78.00

## 2020-02-07 MED FILL — LISINOPRIL 20 MG TABLET: 20 | 30 days supply | Qty: 30 | Fill #0

## 2020-02-07 NOTE — Progress Notes (Signed)
Cardiology Office Note   Date:  02/07/2020   ID:  Magdalina, Madison Robertson [birth year], MRN [id number]  PCP:  Inda Coke, [place]  Cardiologist:   Skeet Latch, MD   No chief complaint on file.    History of Present Illness: Madison Robertson is a 29 y.o. female with hypertension, hyperlipidemia, prediabetes, asthma, anxiety and edema who is being seen today for the evaluation of tachycardia and family history of CAD at the request of Inda Coke, [place].  She saw Ms.Worley on 10/2019 and noted that her heart rate was sporadically over 120 at rest.  She has been receiving notifications from her [place] watch.  She note that her heart rate has been fast since her teens.  Her pediatrician noted that it was due to her ADHD medication.  Her heart rate came down the 90s.  She started back on the medication and her rate has been consistently in the 100s since then.  She sometimes feels her heart racing.  Most of the time her heart rate is over 100 bpm she does not feel anything.  After her second COVID vaccine she had episodes of bradycardia to the 40s and she felt dizzy.  This occurred for approximately 2 weeks and has subsided.  She denies any syncope.  She has an occasinal catching sensation in her L chest like a pulling sensation.  It always occurs at rest.  She does not get much exercise but did a lot of walking at work.  She is a CMA previously walk more pulmonary.  Now she is working for [employer] and likes it.  She just purchased a new exercise bike and hopes to start using that.  She has no shortness of breath.  She does have some lower extremity edema that started when she first started using amlodipine.  She had to stop the medicine after 2 weeks.  The swelling improved but she was started on Lasix as needed.  Initially she will need it every once in a while.  Lately she has been taking it 2 or 3 times per week.  She denies orthopnea or PND.  Ms. Roeder was first diagnosed with  hypertension around age 71.  For the most part it has been controlled.  Lately it is around the 120s over 180s.  Occasionally goes up to 190 over 90s.  This is quite rare.  When it gets hot she has headaches.  She does not use salt in cooking and cooks at home 50% of the time.  She does not have any caffeine intake.  Past Medical History:  Diagnosis Date  . ADD (attention deficit disorder)   . Allergy   . Anxiety   . Asthma    childhood asthma - pt out grew  . Depression   . Edema, lower extremity   . Heart murmur   . Hyperlipidemia    Has never been on medication for this  . Hypertension    Lisinopril-HCTZ  . IBS (irritable bowel syndrome)   . Lactose intolerance   . Migraines    well-controlled when blood pressure is controlled  . Morbid obesity ([place]) 02/07/2020  . Palpitations 02/07/2020  . PCOS (polycystic ovarian syndrome)    10 years; was on metformin for 2 months but didn't like the way it made her feel; has never been on spironolactone  . Pre-diabetes    Hemoglobin A1c 6.0 in September 2019  . Pure hypercholesterolemia 02/07/2020    Past Surgical History:  Procedure Laterality Date  . WISDOM TOOTH EXTRACTION     Feb 2017     Current Outpatient Medications  Medication Sig Dispense Refill  . ADHANSIA XR 55 MG CP24 Take 1 capsule by mouth daily.    Marland Kitchen aluminum chloride (DRYSOL) 20 % external solution Apply topically at bedtime. 35 mL 1  . dicyclomine (BENTYL) 20 MG tablet TAKE 1 TABLET BY MOUTH 3 TIMES DAILY AS NEEDED FOR SPASMS 30 tablet 1  . Dulaglutide (TRULICITY) 1.61 WR/6.0AV SOPN Inject 0.75 mg into the skin once a week. 4 pen 3  . furosemide (LASIX) 20 MG tablet TAKE 1 TABLET (20 MG TOTAL) BY MOUTH DAILY AS NEEDED FOR FLUID. 30 tablet 1  . ibuprofen (ADVIL,MOTRIN) 200 MG tablet Take 400 mg by mouth every 6 (six) hours as needed for fever, headache, moderate pain or cramping.     Marland Kitchen lisinopril (ZESTRIL) 20 MG tablet TAKE 1 TABLET BY MOUTH ONCE DAILY 30 tablet 1  .  ondansetron (ZOFRAN) 4 MG tablet Take 1 tablet (4 mg total) by mouth every 6 (six) hours. 30 tablet 1  . potassium chloride SA (K-DUR) 20 MEQ tablet Take 1 tablet every time you take a lasix tablet. 30 tablet 0  . PROMETHEGAN 25 MG suppository Place 25 mg rectally daily as needed for nausea or vomiting.   0  . triamcinolone cream (KENALOG) 0.1 % APPLY to affected area TOPICALLY DAILY AS NEEDED FOR ECZEMA 30 g 1  . VYLIBRA 0.25-35 MG-MCG tablet TAKE 1 TABLET BY MOUTH DAILY. 28 tablet 5   Current Facility-Administered Medications  Medication Dose Route Frequency Provider Last Rate Last Admin  . 0.9 %  sodium chloride infusion  500 mL Intravenous Continuous Danis, Kirke Corin, MD        Allergies:   Amlodipine and Hydrocodone    Social History:  The patient  reports that she has never smoked. She has never used smokeless tobacco. She reports current alcohol use. She reports that she does not use drugs.   Family History:  The patient's family history includes Anxiety disorder in her mother; Atrial fibrillation in her father; Breast cancer in an other family member; Colon cancer in her maternal grandfather; Colon polyps in her mother; Congestive Heart Failure in her father; Depression in her mother; Diabetes in her father and mother; Esophageal cancer in her maternal grandmother and maternal uncle; Heart attack in her father; Heart disease in her father; Heart failure in her mother; High Cholesterol in her mother; Hypertension in her father, maternal grandfather, maternal grandmother, mother, paternal grandfather, and paternal grandmother; Obesity in her mother; Ovarian cancer in an other family member; Prostate cancer in her paternal grandfather; Uterine cancer in an other family member.    ROS:  Please see the history of present illness.   Otherwise, review of systems are positive for none.   All other systems are reviewed and negative.    PHYSICAL EXAM: VS:  BP 128/82   Pulse (!) 109   Ht 5'  7" (1.702 m)   Wt 276 lb (125.2 kg)   BMI 43.23 kg/m  , BMI Body mass index is 43.23 kg/m. GENERAL:  Well appearing HEENT:  Pupils equal round and reactive, fundi not visualized, oral mucosa unremarkable NECK:  No jugular venous distention, waveform within normal limits, carotid upstroke brisk and symmetric, no bruits, no thyromegaly LYMPHATICS:  No cervical adenopathy LUNGS:  Clear to auscultation bilaterally HEART:  RRR.  PMI not displaced or sustained,S1 and S2 within  normal limits, no S3, no S4, no clicks, no rubs, no murmurs ABD:  Flat, positive bowel sounds normal in frequency in pitch, no bruits, no rebound, no guarding, no midline pulsatile mass, no hepatomegaly, no splenomegaly EXT:  2 plus pulses throughout, no edema, no cyanosis no clubbing SKIN:  No rashes no nodules NEURO:  Cranial nerves II through XII grossly intact, motor grossly intact throughout PSYCH:  Cognitively intact, oriented to person place and time   EKG:  EKG is ordered today. The ekg ordered today demonstrates sinus tachycardia.  Rate 109 bpm.    Recent Labs: 03/05/2019: ALT 10; BUN 9; Creatinine, Ser 0.74; Hemoglobin 12.8; Platelets 245.0; Potassium 4.2; Sodium 140    Lipid Panel    Component Value Date/Time   CHOL 202 (H) 11/05/2019 0949   CHOL 177 01/25/2019 1145   TRIG 139.0 11/05/2019 0949   HDL 47.50 11/05/2019 0949   HDL 54 01/25/2019 1145   CHOLHDL 4 11/05/2019 0949   VLDL 27.8 11/05/2019 0949   LDLCALC 127 (H) 11/05/2019 0949   LDLCALC 109 (H) 01/25/2019 1145      Wt Readings from Last 3 Encounters:  02/07/20 276 lb (125.2 kg)  11/05/19 276 lb 8 oz (125.4 kg)  03/05/19 274 lb (124.3 kg)      ASSESSMENT AND PLAN:  # Sinus tachycardia: Ms. Moffa has resting tachycardia.  This is likely attributable to her ADHD medication, as it is decided when she stopped it and recurrent once it was restarted.  However it is necessary for her management.  We will get an echocardiogram to ensure  there is no evidence of tachycardia mediated cardiomyopathy.  If there is, we will need to start metoprolol.  She is minimally bothered by it.  Based on the findings we will consider low-dose metoprolol.  TSH and blood counts were normal when checked last year and her symptoms were ongoing at that time.  Therefore we will not repeat them at this time.  She does not have much caffeine intake.  She has been under a lot of stress lately which certainly could be contributing but does not seem to be the entire cause.  # Family history of CAD: # Hyperlipidemia:  # Morbid obesity:  Father died of MI recently.  She does not have any symptoms of ischemia.  LDL is 127.  She is quite young but does have prediabetes.  Will consider coronary calcium score versus treatment in the future if labs do not improve with diet and exercise.  Discussed getting at least 150 minutes of exercise weekly.  Limit eating out.  # Hypertension:   Blood pressure reasonably controlled but somewhat labile.  She is doing okay on lisinopril.  She did not tolerate amlodipine due to edema.  Work on diet and exercise.  Her goal is to get 150 minutes of exercise weekly.  # LE edema:   Check an echo as above.  Continue Lasix for now.  Check a BMP since this is a been assessed and she was started on Lasix and is taking it regularly.  We discussed wearing compression stockings when at work and elevating legs when sitting.   Current medicines are reviewed at length with the patient today.  The patient does not have concerns regarding medicines.  The following changes have been made:  no change  Labs/ tests ordered today include:   Orders Placed This Encounter  Procedures  . Basic metabolic panel  . EKG 12-Lead  . ECHOCARDIOGRAM COMPLETE  Disposition:   FU with Mahasin Riviere C. Oval Linsey, MD, [facility] in 2 months    Signed, Jolena Kittle C. Oval Linsey, MD, [facility]  02/07/2020 12:53 PM    [place]

## 2020-02-07 NOTE — Patient Instructions (Signed)
Medication Instructions:  Your physician recommends that you continue on your current medications as directed. Please refer to the Current Medication list given to you today.  *If you need a refill on your cardiac medications before your next appointment, please call your pharmacy*  Lab Work: BMET TODAY   If you have labs (blood work) drawn today and your tests are completely normal, you will receive your results only by: Marland Kitchen MyChart Message (if you have MyChart) OR . A paper copy in the mail If you have any lab test that is abnormal or we need to change your treatment, we will call you to review the results.  Testing/Procedures: Your physician has requested that you have an echocardiogram. Echocardiography is a painless test that uses sound waves to create images of your heart. It provides your doctor with information about the size and shape of your heart and how well your heart's chambers and valves are working. This procedure takes approximately one hour. There are no restrictions for this procedure. [place] [address]  Follow-Up: At [facility], you and your health needs are our priority.  As part of our continuing mission to provide you with exceptional heart care, we have created designated Provider Care Teams.  These Care Teams include your primary Cardiologist (physician) and Advanced Practice Providers (APPs -  Physician Assistants and Nurse Practitioners) who all work together to provide you with the care you need, when you need it.  We recommend signing up for the patient portal called "MyChart".  Sign up information is provided on this After Visit Summary.  MyChart is used to connect with patients for Virtual Visits (Telemedicine).  Patients are able to view lab/test results, encounter notes, upcoming appointments, etc.  Non-urgent messages can be sent to your provider as well.   To learn more about what you can do with MyChart, go to [email or web address].      Your next appointment:   2 month(s)  The format for your next appointment:   In Person  Provider:   You may see DR [facility] or one of the following Advanced Practice Providers on your designated Care Team:    Kerin Ransom, PA-C  [place], [place]  Coletta Memos, [place]  Other Instructions  Echocardiogram An echocardiogram is a procedure that uses painless sound waves (ultrasound) to produce an image of the heart. Images from an echocardiogram can provide important information about:  Signs of coronary artery disease (CAD).  Aneurysm detection. An aneurysm is a weak or damaged part of an artery wall that bulges out from the normal force of blood pumping through the body.  Heart size and shape. Changes in the size or shape of the heart can be associated with certain conditions, including heart failure, aneurysm, and CAD.  Heart muscle function.  Heart valve function.  Signs of a past heart attack.  Fluid buildup around the heart.  Thickening of the heart muscle.  A tumor or infectious growth around the heart valves. Tell a health care provider about:  Any allergies you have.  All medicines you are taking, including vitamins, herbs, eye drops, creams, and over-the-counter medicines.  Any blood disorders you have.  Any surgeries you have had.  Any medical conditions you have.  Whether you are pregnant or may be pregnant. What are the risks? Generally, this is a safe procedure. However, problems may occur, including:  Allergic reaction to dye (contrast) that may be used during the procedure. What happens before the  procedure? No specific preparation is needed. You may eat and drink normally. What happens during the procedure?   An IV tube may be inserted into one of your veins.  You may receive contrast through this tube. A contrast is an injection that improves the quality of the pictures from your heart.  A gel will be applied to your chest.  A  wand-like tool (transducer) will be moved over your chest. The gel will help to transmit the sound waves from the transducer.  The sound waves will harmlessly bounce off of your heart to allow the heart images to be captured in real-time motion. The images will be recorded on a computer. The procedure may vary among health care providers and hospitals. What happens after the procedure?  You may return to your normal, everyday life, including diet, activities, and medicines, unless your health care provider tells you not to do that. Summary  An echocardiogram is a procedure that uses painless sound waves (ultrasound) to produce an image of the heart.  Images from an echocardiogram can provide important information about the size and shape of your heart, heart muscle function, heart valve function, and fluid buildup around your heart.  You do not need to do anything to prepare before this procedure. You may eat and drink normally.  After the echocardiogram is completed, you may return to your normal, everyday life, unless your health care provider tells you not to do that. This information is not intended to replace advice given to you by your health care provider. Make sure you discuss any questions you have with your health care provider. Document Revised: 11/05/2018 Document Reviewed: 08/17/2016 Elsevier Patient Education  [place].

## 2020-02-08 LAB — BASIC METABOLIC PANEL
BUN/Creatinine Ratio: 7 — ABNORMAL LOW (ref 9–23)
BUN: 5 mg/dL — ABNORMAL LOW (ref 6–20)
CO2: 25 mmol/L (ref 20–29)
Calcium: 9.2 mg/dL (ref 8.7–10.2)
Chloride: 104 mmol/L (ref 96–106)
Creatinine, Ser: 0.74 mg/dL (ref 0.57–1.00)
GFR calc Af Amer: 125 mL/min/{1.73_m2} (ref >59)
GFR calc non Af Amer: 108 mL/min/{1.73_m2} (ref >59)
Glucose: 81 mg/dL (ref 65–99)
Potassium: 4.2 mmol/L (ref 3.5–5.2)
Sodium: 142 mmol/L (ref 134–144)

## 2020-02-25 ENCOUNTER — Other Ambulatory Visit: Payer: Self-pay

## 2020-02-25 ENCOUNTER — Ambulatory Visit (HOSPITAL_COMMUNITY): Payer: No Typology Code available for payment source | Attending: Cardiovascular Disease

## 2020-02-25 DIAGNOSIS — R000 Tachycardia, unspecified: Secondary | ICD-10-CM | POA: Diagnosis not present

## 2020-02-25 DIAGNOSIS — R609 Edema, unspecified: Secondary | ICD-10-CM | POA: Diagnosis not present

## 2020-02-25 LAB — ECHOCARDIOGRAM COMPLETE
Area-P 1/2: 3.31 cm2
S' Lateral: 2.8 cm

## 2020-02-25 MED FILL — TRULICITY 0.75 MG/0.5 ML PE: 0.75 | 28 days supply | Qty: 2 | Fill #3

## 2020-02-29 MED FILL — PROMETHAZINE 25 MG TABLET: 25 | 5 days supply | Qty: 15 | Fill #0

## 2020-03-01 ENCOUNTER — Encounter: Payer: Self-pay | Admitting: Physician Assistant

## 2020-03-01 ENCOUNTER — Encounter: Payer: Self-pay | Admitting: Gastroenterology

## 2020-03-01 DIAGNOSIS — R896 Abnormal cytological findings in specimens from other organs, systems and tissues: Secondary | ICD-10-CM

## 2020-03-03 MED FILL — VYLIBRA 0.25-35 MG-MCG TABS: 0.25-35 | 28 days supply | Qty: 28 | Fill #3

## 2020-03-03 MED FILL — DICYCLOMINE 20 MG TABLET: 20 | 10 days supply | Qty: 30 | Fill #1

## 2020-03-06 MED FILL — CEPHALEXIN 500 MG CAPSULE: 500 | 7 days supply | Qty: 14 | Fill #0

## 2020-03-18 MED FILL — LISINOPRIL 20 MG TABLET: 20 | 30 days supply | Qty: 30 | Fill #1

## 2020-03-28 MED FILL — DAYTRANA 30 MG/9 HOUR PATCH: 30 | 30 days supply | Qty: 30 | Fill #0

## 2020-03-28 MED FILL — LISINOPRIL 20 MG TABLET: 20 | 30 days supply | Qty: 30 | Fill #1

## 2020-04-07 MED FILL — VYLIBRA 0.25-35 MG-MCG TABS: 0.25-35 | 28 days supply | Qty: 28 | Fill #4

## 2020-04-07 MED FILL — TRIAMCINOLONE 0.1% CREAM: 0.1 | 30 days supply | Qty: 30 | Fill #1

## 2020-04-14 ENCOUNTER — Other Ambulatory Visit: Payer: Self-pay | Admitting: Gastroenterology

## 2020-04-14 ENCOUNTER — Encounter: Payer: Self-pay | Admitting: Cardiovascular Disease

## 2020-04-14 ENCOUNTER — Other Ambulatory Visit: Payer: Self-pay

## 2020-04-14 ENCOUNTER — Ambulatory Visit (INDEPENDENT_AMBULATORY_CARE_PROVIDER_SITE_OTHER): Payer: No Typology Code available for payment source | Admitting: Cardiovascular Disease

## 2020-04-14 ENCOUNTER — Other Ambulatory Visit: Payer: No Typology Code available for payment source

## 2020-04-14 ENCOUNTER — Ambulatory Visit: Payer: No Typology Code available for payment source | Admitting: Gastroenterology

## 2020-04-14 ENCOUNTER — Encounter: Payer: Self-pay | Admitting: Gastroenterology

## 2020-04-14 VITALS — BP 130/78 | HR 100 | Temp 98.2°F | Ht 67.0 in | Wt 283.6 lb

## 2020-04-14 VITALS — BP 138/90 | HR 84 | Ht 67.75 in | Wt 283.0 lb

## 2020-04-14 DIAGNOSIS — R112 Nausea with vomiting, unspecified: Secondary | ICD-10-CM | POA: Insufficient documentation

## 2020-04-14 DIAGNOSIS — R933 Abnormal findings on diagnostic imaging of other parts of digestive tract: Secondary | ICD-10-CM | POA: Diagnosis not present

## 2020-04-14 DIAGNOSIS — R1084 Generalized abdominal pain: Secondary | ICD-10-CM

## 2020-04-14 DIAGNOSIS — I2510 Atherosclerotic heart disease of native coronary artery without angina pectoris: Secondary | ICD-10-CM | POA: Diagnosis not present

## 2020-04-14 DIAGNOSIS — E7800 Pure hypercholesterolemia, unspecified: Secondary | ICD-10-CM | POA: Diagnosis not present

## 2020-04-14 DIAGNOSIS — I10 Essential (primary) hypertension: Secondary | ICD-10-CM | POA: Diagnosis not present

## 2020-04-14 DIAGNOSIS — R002 Palpitations: Secondary | ICD-10-CM

## 2020-04-14 HISTORY — DX: Atherosclerotic heart disease of native coronary artery without angina pectoris: I25.10

## 2020-04-14 MED ORDER — METOPROLOL SUCCINATE ER 25 MG PO TB24
25.0000 mg | ORAL_TABLET | Freq: Every day | ORAL | 3 refills | Status: DC
Start: 2020-04-14 — End: 2020-04-14

## 2020-04-14 MED ORDER — PROMETHAZINE HCL 25 MG PO TABS
25.0000 mg | ORAL_TABLET | Freq: Three times a day (TID) | ORAL | 1 refills | Status: DC | PRN
Start: 2020-04-14 — End: 2020-04-14

## 2020-04-14 MED FILL — METOPROLOL SUCCINATE ER 25: 25 | 90 days supply | Qty: 90 | Fill #0

## 2020-04-14 MED FILL — PROMETHAZINE 25 MG TABLET: 25 | 10 days supply | Qty: 30 | Fill #0

## 2020-04-14 NOTE — Progress Notes (Signed)
04/14/2020 Madison Robertson [id number] 11/17/1990   HISTORY OF PRESENT ILLNESS: This is a pleasant 29 year old female is a patient of Dr. Corena Pilgrim.  She has been evaluated by him once in our office in 2018 for complaints of upper abdominal pain with nausea, vomiting, and altered bowel habits.  Colonoscopy and upper endoscopy in August 2018 were essentially normal.  She was suspected to have IBS.  She presents here today with recurrent symptoms.  She tells me that the symptoms come in episodes.  She feels that they are coming more frequently and more severe now.  She had a severe episode in August and went to the emergency department at [facility] where a CT scan was performed and showed multiple mildly distended fluid-filled loops of small and large bowel with air-fluid levels as well as mild diffuse small bowel wall thickening and mucosal hyperemia.  She was told to follow-up here for evaluation of possible inflammatory bowel disease.  Of note, she did have CT scan performed in 2017 that noted circumferential wall thickening of the distal small bowel concerning for enteritis.  Inflammatory markers last year were normal.  She tells me that her symptoms are upper mid abdominal pain, nausea, severe vomiting, and change in bowel habits between alternating loose stools and then sometimes some mild constipation.   Referred here by Inda Coke, PA-C.  Past Medical History:  Diagnosis Date  . ADD (attention deficit disorder)   . Allergy   . Anxiety   . Asthma    childhood asthma - pt out grew  . CAD in native artery 04/14/2020  . Depression   . Edema, lower extremity   . Heart murmur   . Hyperlipidemia    Has never been on medication for this  . Hypertension    Lisinopril-HCTZ  . IBS (irritable bowel syndrome)   . Lactose intolerance   . Migraines    well-controlled when blood pressure is controlled  . Morbid obesity ([place]) 02/07/2020  . Palpitations 02/07/2020  . PCOS (polycystic ovarian  syndrome)    10 years; was on metformin for 2 months but didn't like the way it made her feel; has never been on spironolactone  . Pre-diabetes    Hemoglobin A1c 6.0 in September 2019  . Pure hypercholesterolemia 02/07/2020   Past Surgical History:  Procedure Laterality Date  . WISDOM TOOTH EXTRACTION     Feb 2017    reports that she has never smoked. She has never used smokeless tobacco. She reports current alcohol use. She reports that she does not use drugs. family history includes Anxiety disorder in her mother; Atrial fibrillation in her father; Breast cancer in an other family member; Colon cancer in her maternal grandfather; Colon polyps in her mother; Congestive Heart Failure in her father; Depression in her mother; Diabetes in her father and mother; Esophageal cancer in her maternal grandmother and maternal uncle; Heart attack in her father; Heart disease in her father; Heart failure in her mother; High Cholesterol in her mother; Hypertension in her father, maternal grandfather, maternal grandmother, mother, paternal grandfather, and paternal grandmother; Obesity in her mother; Ovarian cancer in an other family member; Prostate cancer in her paternal grandfather; Uterine cancer in an other family member. Allergies  Allergen Reactions  . Amlodipine     edema  . Hydrocodone Nausea And Vomiting      Outpatient Encounter Medications as of 04/14/2020  Medication Sig  . aluminum chloride (DRYSOL) 20 % external solution Apply topically at bedtime.  Marland Kitchen  cephALEXin (KEFLEX) 500 MG capsule Take 500 mg by mouth 2 (two) times daily.  Marland Kitchen dicyclomine (BENTYL) 20 MG tablet TAKE 1 TABLET BY MOUTH 3 TIMES DAILY AS NEEDED FOR SPASMS  . furosemide (LASIX) 20 MG tablet TAKE 1 TABLET (20 MG TOTAL) BY MOUTH DAILY AS NEEDED FOR FLUID.  Marland Kitchen ibuprofen (ADVIL,MOTRIN) 200 MG tablet Take 400 mg by mouth every 6 (six) hours as needed for fever, headache, moderate pain or cramping.   Marland Kitchen lisinopril (ZESTRIL) 20 MG  tablet TAKE 1 TABLET BY MOUTH ONCE DAILY  . methylphenidate (DAYTRANA) 30 MG/9HR   . metoprolol succinate (TOPROL XL) 25 MG 24 hr tablet Take 1 tablet (25 mg total) by mouth daily.  . potassium chloride SA (K-DUR) 20 MEQ tablet Take 1 tablet every time you take a lasix tablet.  . promethazine (PHENERGAN) 25 MG tablet Take 25 mg by mouth every 8 (eight) hours as needed.  . triamcinolone cream (KENALOG) 0.1 % APPLY to affected area TOPICALLY DAILY AS NEEDED FOR ECZEMA  . VYLIBRA 0.25-35 MG-MCG tablet TAKE 1 TABLET BY MOUTH DAILY.  . [DISCONTINUED] PROMETHEGAN 25 MG suppository Place 25 mg rectally daily as needed for nausea or vomiting.    Facility-Administered Encounter Medications as of 04/14/2020  Medication  . 0.9 %  sodium chloride infusion     REVIEW OF SYSTEMS  : All other systems reviewed and negative except where noted in the History of Present Illness.   PHYSICAL EXAM: BP 138/90   Pulse 84   Ht 5' 7.75" (1.721 m)   Wt 283 lb (128.4 kg)   BMI 43.35 kg/m  General: Well developed AA female in no acute distress Head: Normocephalic and atraumatic Eyes:  Sclerae anicteric, conjunctiva pink. Ears: Normal auditory acuity  Lungs: Clear throughout to auscultation; no W/R/R. Heart: Regular rate and rhythm; no M/R/G. Abdomen: Soft, non-distended.  BS present.  Non-tender. Musculoskeletal: Symmetrical with no gross deformities  Skin: No lesions on visible extremities Extremities: No edema  Neurological: Alert oriented x 4, grossly non-focal Psychological:  Alert and cooperative. Normal mood and affect  ASSESSMENT AND PLAN: *29 year old female with complaints of recurrent upper abdominal pain with associated nausea, vomiting, and change in bowel habits.  This comes in episodes, but symptoms are much more frequent recently.  She has had at least 2 CT scans, one in 2017 and one in August 2021 that both showed diffuse small bowel wall thickening.  Colonoscopy was previously unremarkable.   Question if she has small bowel Crohn's disease.  Previous inflammatory markers were unremarkable/normal.  We will check IBD panel and a fecal calprotectin level.  I think that she probably deserves some type of small bowel imaging either with a CT enterography or a video capsule endoscopy.  She has already undergone multiple CT scans, however, so she's had a lot of radiation.  Will await Dr. Corena Pilgrim input about further imaging.  I advised that we will contact her back with the results of her labs and stool studies and with further plans likely sometime next week.  Will refill her phenergan at her request.  Prescription sent to pharmacy.   CC:  Inda Coke, [place]

## 2020-04-14 NOTE — Patient Instructions (Signed)
If you are age 31 or older, your body mass index should be between 23-30. Your Body mass index is 43.35 kg/m. If this is out of the aforementioned range listed, please consider follow up with your Primary Care Provider.  If you are age 43 or younger, your body mass index should be between 19-25. Your Body mass index is 43.35 kg/m. If this is out of the aformentioned range listed, please consider follow up with your Primary Care Provider.   Your provider has requested that you go to the basement level for lab work before leaving today. Press "B" on the elevator. The lab is located at the first door on the left as you exit the elevator.  Refilled phenergan

## 2020-04-14 NOTE — Progress Notes (Addendum)
Cardiology Office Note   Date:  04/14/2020   ID:  Madison, Robertson [birth year], MRN [id number]  PCP:  Inda Coke, [place]  Cardiologist:   Skeet Latch, MD   No chief complaint on file.    History of Present Illness: Madison Robertson is a 29 y.o. female with hypertension, hyperlipidemia, prediabetes, asthma, anxiety and edema here for follow-up. She was initially seen 01/2020 forthe evaluation of tachycardia and family history of CAD.  She saw Ms.Worley on 10/2019 and noted that her heart rate was sporadically over 120 at rest.  She received notifications from her Petros watch.  She note that her heart rate has been fast since her teens.  Her pediatrician noted that it was due to her ADHD medication.  Her heart rate came down the 90s when it was held.  She started back on the medication and her rate has been consistently in the 100s since then. She was minimally bothered by the symptoms. She had an echocardiogram 01/2020 that revealed LVEF 60 to 65% with normal diastolic function. She also reported lower extremity edema but there is no evidence of volume overload on her echo. She reported some bradycardia and dizziness after her COVID-19 vaccine.  Ms. Madison Robertson was first diagnosed with hypertension around age 64.  For the most part it has been controlled. She was instructed to work on diet and exercise. She also has a family history of CAD and was going to consider getting a coronary calcium score.  A few days after she got her echo she had severe abdominal pain and went to the ED.  She was thought to have Chron's disease.  She is going to see GI for evaluation today.  Trulicity was stopped and she has been gaining weight.  She has been using her treadmill 30-40 minutes twice daily.  She is also losing weight.  She has also been working on her diet.  She rarely eats red meat and is eating more fruit and vegetables.  This has upset her stomach upset.  She notices that her heart rate is starting to  increase again.  Last week it was 150 bpm.  Her BP has been mostly 110s/60s.   Past Medical History:  Diagnosis Date  . ADD (attention deficit disorder)   . Allergy   . Anxiety   . Asthma    childhood asthma - pt out grew  . CAD in native artery 04/14/2020  . Depression   . Edema, lower extremity   . Heart murmur   . Hyperlipidemia    Has never been on medication for this  . Hypertension    Lisinopril-HCTZ  . IBS (irritable bowel syndrome)   . Lactose intolerance   . Migraines    well-controlled when blood pressure is controlled  . Morbid obesity ([place]) 02/07/2020  . Palpitations 02/07/2020  . PCOS (polycystic ovarian syndrome)    10 years; was on metformin for 2 months but didn't like the way it made her feel; has never been on spironolactone  . Pre-diabetes    Hemoglobin A1c 6.0 in September 2019  . Pure hypercholesterolemia 02/07/2020    Past Surgical History:  Procedure Laterality Date  . WISDOM TOOTH EXTRACTION     Feb 2017     Current Outpatient Medications  Medication Sig Dispense Refill  . aluminum chloride (DRYSOL) 20 % external solution Apply topically at bedtime. 35 mL 1  . dicyclomine (BENTYL) 20 MG tablet TAKE 1 TABLET BY MOUTH 3 TIMES  DAILY AS NEEDED FOR SPASMS 30 tablet 1  . furosemide (LASIX) 20 MG tablet TAKE 1 TABLET (20 MG TOTAL) BY MOUTH DAILY AS NEEDED FOR FLUID. 30 tablet 1  . ibuprofen (ADVIL,MOTRIN) 200 MG tablet Take 400 mg by mouth every 6 (six) hours as needed for fever, headache, moderate pain or cramping.     Marland Kitchen lisinopril (ZESTRIL) 20 MG tablet TAKE 1 TABLET BY MOUTH ONCE DAILY 30 tablet 1  . potassium chloride SA (K-DUR) 20 MEQ tablet Take 1 tablet every time you take a lasix tablet. 30 tablet 0  . triamcinolone cream (KENALOG) 0.1 % APPLY to affected area TOPICALLY DAILY AS NEEDED FOR ECZEMA 30 g 1  . VYLIBRA 0.25-35 MG-MCG tablet TAKE 1 TABLET BY MOUTH DAILY. 28 tablet 5  . metoprolol succinate (TOPROL XL) 25 MG 24 hr tablet Take 1 tablet (25  mg total) by mouth daily. 90 tablet 3  . PROMETHEGAN 25 MG suppository Place 25 mg rectally daily as needed for nausea or vomiting.   0   Current Facility-Administered Medications  Medication Dose Route Frequency Provider Last Rate Last Admin  . 0.9 %  sodium chloride infusion  500 mL Intravenous Continuous Danis, Kirke Corin, MD        Allergies:   Amlodipine and Hydrocodone    Social History:  The patient  reports that she has never smoked. She has never used smokeless tobacco. She reports current alcohol use. She reports that she does not use drugs.   Family History:  The patient's family history includes Anxiety disorder in her mother; Atrial fibrillation in her father; Breast cancer in an other family member; Colon cancer in her maternal grandfather; Colon polyps in her mother; Congestive Heart Failure in her father; Depression in her mother; Diabetes in her father and mother; Esophageal cancer in her maternal grandmother and maternal uncle; Heart attack in her father; Heart disease in her father; Heart failure in her mother; High Cholesterol in her mother; Hypertension in her father, maternal grandfather, maternal grandmother, mother, paternal grandfather, and paternal grandmother; Obesity in her mother; Ovarian cancer in an other family member; Prostate cancer in her paternal grandfather; Uterine cancer in an other family member.    ROS:  Please see the history of present illness.   Otherwise, review of systems are positive for none.   All other systems are reviewed and negative.    PHYSICAL EXAM: VS:  BP 130/78   Pulse 100   Temp 98.2 F (36.8 C)   Ht 5\' 7"  (1.702 m)   Wt 283 lb 9.6 oz (128.6 kg)   SpO2 98%   BMI 44.42 kg/m  , BMI Body mass index is 44.42 kg/m. GENERAL:  Well appearing HEENT:  Pupils equal round and reactive, fundi not visualized, oral mucosa unremarkable NECK:  No jugular venous distention, waveform within normal limits, carotid upstroke brisk and symmetric,  no bruits LUNGS:  Clear to auscultation bilaterally HEART:  RRR.  PMI not displaced or sustained,S1 and S2 within normal limits, no S3, no S4, no clicks, no rubs, no murmurs ABD:  Flat, positive bowel sounds normal in frequency in pitch, no bruits, no rebound, no guarding, no midline pulsatile mass, no hepatomegaly, no splenomegaly EXT:  2 plus pulses throughout, trace edema, no cyanosis no clubbing SKIN:  No rashes no nodules NEURO:  Cranial nerves II through XII grossly intact, motor grossly intact throughout PSYCH:  Cognitively intact, oriented to person place and time   EKG:  EKG is  not ordered today. The ekg ordered 02/07/20 demonstrates sinus tachycardia.  Rate 109 bpm.   Echo 02/25/2020:  1. Normal coronary artery origins.  2. Left ventricular ejection fraction, by estimation, is 60 to 65%. The  left ventricle has normal function. The left ventricle has no regional  wall motion abnormalities. Left ventricular diastolic parameters were  normal.  3. Right ventricular systolic function is normal. The right ventricular  size is normal.  4. The mitral valve is normal in structure. Trivial mitral valve  regurgitation. No evidence of mitral stenosis.  5. The aortic valve is normal in structure. Aortic valve regurgitation is  not visualized. No aortic stenosis is present.  6. The inferior vena cava is normal in size with greater than 50%  respiratory variability, suggesting right atrial pressure of 3 mmHg.   Recent Labs: 02/07/2020: BUN 5; Creatinine, Ser 0.74; Potassium 4.2; Sodium 142    Lipid Panel    Component Value Date/Time   CHOL 202 (H) 11/05/2019 0949   CHOL 177 01/25/2019 1145   TRIG 139.0 11/05/2019 0949   HDL 47.50 11/05/2019 0949   HDL 54 01/25/2019 1145   CHOLHDL 4 11/05/2019 0949   VLDL 27.8 11/05/2019 0949   LDLCALC 127 (H) 11/05/2019 0949   LDLCALC 109 (H) 01/25/2019 1145      Wt Readings from Last 3 Encounters:  04/14/20 283 lb 9.6 oz (128.6 kg)   02/07/20 276 lb (125.2 kg)  11/05/19 276 lb 8 oz (125.4 kg)      ASSESSMENT AND PLAN:  # Sinus tachycardia: Ms. Denne has resting tachycardia.  This is likely attributable to her ADHD medication, as it subsided when she stopped it and recurrent once it was restarted.  However it is necessary for her management.  Echo showed no evidence of tachycardia mediated cardiomyopathy.  She has bothered by her tachycardia.  We will start metoprolol succinate 25 mg daily.  TSH and blood counts are within normal limits.    # Family history of CAD: # Hyperlipidemia:  # Morbid obesity:  Father died of MI recently.  There is no coronary artery disease on her CT in 2019.  No utility in the calcium score.  Adding metoprolol as above.  Continue with primary prevention with diet and exercise.  LDL 127 on 10/2019.  She is limiting red meats and working on her diet.  # Hypertension:   Blood pressure r is well-controlled.  Continue lisinopril and adding metoprolol as above.  # LE edema:   Likely venous stasis.  There is no systolic or diastolic dysfunction on echo and a right atrial pressure was 3 mmHg.   Current medicines are reviewed at length with the patient today.  The patient does not have concerns regarding medicines.  The following changes have been made: And metoprolol  Labs/ tests ordered today include:   No orders of the defined types were placed in this encounter.    Disposition:   FU with Azyiah Bo C. Oval Linsey, MD, [facility] in 6 months    Signed, Abbye Lao C. Oval Linsey, MD, [facility]  04/14/2020 8:42 AM    [place]

## 2020-04-14 NOTE — Patient Instructions (Signed)
Medication Instructions:  START METOPROLOL SUC 25 MG DALY   *If you need a refill on your cardiac medications before your next appointment, please call your pharmacy*  Lab Work: NONE   Testing/Procedures: NONE   Follow-Up: At [facility], you and your health needs are our priority.  As part of our continuing mission to provide you with exceptional heart care, we have created designated Provider Care Teams.  These Care Teams include your primary Cardiologist (physician) and Advanced Practice Providers (APPs -  Physician Assistants and Nurse Practitioners) who all work together to provide you with the care you need, when you need it.  We recommend signing up for the patient portal called "MyChart".  Sign up information is provided on this After Visit Summary.  MyChart is used to connect with patients for Virtual Visits (Telemedicine).  Patients are able to view lab/test results, encounter notes, upcoming appointments, etc.  Non-urgent messages can be sent to your provider as well.   To learn more about what you can do with MyChart, go to [email or web address].    Your next appointment:   6 month(s)  The format for your next appointment:   In Person  Provider:   You may see [facility] or one of the following Advanced Practice Providers on your designated Care Team:    Kerin Ransom, PA-C  [place], [place]  Coletta Memos, [place]

## 2020-04-17 ENCOUNTER — Other Ambulatory Visit: Payer: No Typology Code available for payment source

## 2020-04-17 DIAGNOSIS — R933 Abnormal findings on diagnostic imaging of other parts of digestive tract: Secondary | ICD-10-CM

## 2020-04-17 DIAGNOSIS — R112 Nausea with vomiting, unspecified: Secondary | ICD-10-CM

## 2020-04-17 DIAGNOSIS — R1084 Generalized abdominal pain: Secondary | ICD-10-CM

## 2020-04-18 ENCOUNTER — Telehealth: Payer: Self-pay

## 2020-04-18 DIAGNOSIS — R933 Abnormal findings on diagnostic imaging of other parts of digestive tract: Secondary | ICD-10-CM

## 2020-04-18 DIAGNOSIS — R1084 Generalized abdominal pain: Secondary | ICD-10-CM

## 2020-04-18 DIAGNOSIS — R112 Nausea with vomiting, unspecified: Secondary | ICD-10-CM

## 2020-04-18 LAB — IBD EXPANDED PANEL
ACCA: 25 units (ref 0–90)
ALCA: 6 units (ref 0–60)
AMCA: 238 units — ABNORMAL HIGH (ref 0–100)
Atypical pANCA: NEGATIVE
gASCA: 30 units (ref 0–50)

## 2020-04-18 NOTE — Telephone Encounter (Signed)
-----   Message from Loralie Champagne, PA-C sent at 04/18/2020  1:05 PM EDT ----- Please let the patient know that I discussed with Dr. Loletha Carrow.  We are concerned about doing another CT scan since she is had so much radiation over the past few years.  We are also worried about doing a capsule endoscopy with the potential of the capsule getting stuck if she has a lot of inflammation or any type of stricture if this is in fact Crohn's disease.  Therefore, we came to the conclusion that MR enterography may be the best route to go if we can get it approved.  This is an MRI that is used to specifically evaluate the small intestines.  Please schedule and see if we can get this approved.  Thank you,  Janett Billow  ----- Message ----- From: Doran Stabler, MD Sent: 04/18/2020  12:43 PM EDT To: Loralie Champagne, PA-C    ----- Message ----- From: Loralie Champagne, PA-C Sent: 04/14/2020   1:21 PM EDT To: Doran Stabler, MD

## 2020-04-18 NOTE — Progress Notes (Signed)
____________________________________________________________  Attending physician addendum:  Thank you for sending this case to me. I have reviewed the entire note, and thanks for discussing it with me today.  I reviewed prior records and the recent [facility] CT scan report. That report does not specifically state abnormal TI or clear evidence of chronic obstruction/stricture, but I agree that this could be SB Crohn's/  Since she has had at least 3 CT scan abdomen in last several years, please try to get approval for an MR Enterography (no radiation).  Wilfrid Lund, MD  ____________________________________________________________

## 2020-04-19 LAB — CALPROTECTIN, FECAL: Calprotectin, Fecal: <16 ug/g (ref 0–120)

## 2020-04-19 NOTE — Telephone Encounter (Signed)
You have been scheduled for an MRI at [facility] on 04/26/20. Your appointment time is 7 am. Please arrive 30 minutes prior to your appointment time for registration purposes. Please make certain not to have anything to eat or drink 6 hours prior to your test. In addition, if you have any metal in your body, have a pacemaker or defibrillator, please be sure to let your ordering physician know. This test typically takes 45 minutes to 1 hour to complete. Should you need to reschedule, please call [phone] to do so.

## 2020-04-19 NOTE — Telephone Encounter (Signed)
The patient has been notified of this information and all questions answered.

## 2020-04-20 ENCOUNTER — Other Ambulatory Visit: Payer: Self-pay

## 2020-04-20 DIAGNOSIS — R112 Nausea with vomiting, unspecified: Secondary | ICD-10-CM

## 2020-04-20 DIAGNOSIS — R1084 Generalized abdominal pain: Secondary | ICD-10-CM

## 2020-04-20 DIAGNOSIS — R933 Abnormal findings on diagnostic imaging of other parts of digestive tract: Secondary | ICD-10-CM

## 2020-04-25 MED FILL — DAYTRANA 30 MG/9 HOUR PATCH: 30 | 30 days supply | Qty: 30 | Fill #0

## 2020-04-26 ENCOUNTER — Inpatient Hospital Stay (HOSPITAL_COMMUNITY): Admission: RE | Admit: 2020-04-26 | Payer: No Typology Code available for payment source | Source: Ambulatory Visit

## 2020-05-01 ENCOUNTER — Other Ambulatory Visit: Payer: Self-pay

## 2020-05-01 ENCOUNTER — Ambulatory Visit (HOSPITAL_COMMUNITY)
Admission: RE | Admit: 2020-05-01 | Discharge: 2020-05-01 | Disposition: A | Discharge status: Home / Self Care | Payer: No Typology Code available for payment source | Source: Ambulatory Visit | Attending: Gastroenterology | Admitting: Gastroenterology

## 2020-05-01 DIAGNOSIS — R112 Nausea with vomiting, unspecified: Secondary | ICD-10-CM | POA: Insufficient documentation

## 2020-05-01 DIAGNOSIS — R933 Abnormal findings on diagnostic imaging of other parts of digestive tract: Secondary | ICD-10-CM | POA: Diagnosis present

## 2020-05-01 DIAGNOSIS — R1084 Generalized abdominal pain: Secondary | ICD-10-CM | POA: Diagnosis present

## 2020-05-01 MED ORDER — GADOBUTROL 1 MMOL/ML IV SOLN
10.0000 mL | Freq: Once | INTRAVENOUS | Status: AC | PRN
  Administered 2020-05-01: 10 mL via INTRAVENOUS

## 2020-05-02 MED ORDER — BARIUM SULFATE 0.1 % PO SUSP
1350.0000 mL | Freq: Once | ORAL | Status: AC
  Administered 2020-05-01: 1350 mL via ORAL

## 2020-05-11 MED FILL — VYLIBRA 0.25-35 MG-MCG TABS: 0.25-35 | 28 days supply | Qty: 28 | Fill #5

## 2020-05-26 ENCOUNTER — Other Ambulatory Visit (HOSPITAL_COMMUNITY): Payer: Self-pay | Admitting: Internal Medicine

## 2020-05-26 MED FILL — DAYTRANA 30 MG/9 HOUR PATCH: 30 | 30 days supply | Qty: 30 | Fill #0

## 2020-06-10 ENCOUNTER — Telehealth: Payer: No Typology Code available for payment source | Admitting: Family

## 2020-06-10 DIAGNOSIS — R059 Cough, unspecified: Secondary | ICD-10-CM

## 2020-06-10 DIAGNOSIS — J209 Acute bronchitis, unspecified: Secondary | ICD-10-CM | POA: Diagnosis not present

## 2020-06-10 MED ORDER — PREDNISONE 10 MG (21) PO TBPK: ORAL_TABLET | ORAL | 0 refills | Status: DC

## 2020-06-10 MED ORDER — BENZONATATE 100 MG PO CAPS: 200.0000 mg | ORAL_CAPSULE | Freq: Three times a day (TID) | ORAL | 0 refills | Status: DC | PRN

## 2020-06-10 NOTE — Progress Notes (Signed)
We are sorry that you are not feeling well.  Here is how we plan to help!  Based on your presentation I believe you most likely have A cough due to a virus.  This is called viral bronchitis and is best treated by rest, plenty of fluids and control of the cough.  You may use Ibuprofen or Tylenol as directed to help your symptoms.     In addition you may use A prescription cough medication called Tessalon Perles 100mg. You may take 1-2 capsules every 8 hours as needed for your cough.  Prednisone 10 mg daily for 6 days (see taper instructions below)  Directions for 6 day taper: Day 1: 2 tablets before breakfast, 1 after both lunch & dinner and 2 at bedtime Day 2: 1 tab before breakfast, 1 after both lunch & dinner and 2 at bedtime Day 3: 1 tab at each meal & 1 at bedtime Day 4: 1 tab at breakfast, 1 at lunch, 1 at bedtime Day 5: 1 tab at breakfast & 1 tab at bedtime Day 6: 1 tab at breakfast   From your responses in the eVisit questionnaire you describe inflammation in the upper respiratory tract which is causing a significant cough.  This is commonly called Bronchitis and has four common causes:    Allergies  Viral Infections  Acid Reflux  Bacterial Infection Allergies, viruses and acid reflux are treated by controlling symptoms or eliminating the cause. An example might be a cough caused by taking certain blood pressure medications. You stop the cough by changing the medication. Another example might be a cough caused by acid reflux. Controlling the reflux helps control the cough.  USE OF BRONCHODILATOR ("RESCUE") INHALERS: There is a risk from using your bronchodilator too frequently.  The risk is that over-reliance on a medication which only relaxes the muscles surrounding the breathing tubes can reduce the effectiveness of medications prescribed to reduce swelling and congestion of the tubes themselves.  Although you feel brief relief from the bronchodilator inhaler, your asthma may  actually be worsening with the tubes becoming more swollen and filled with mucus.  This can delay other crucial treatments, such as oral steroid medications. If you need to use a bronchodilator inhaler daily, several times per day, you should discuss this with your provider.  There are probably better treatments that could be used to keep your asthma under control.     HOME CARE . Only take medications as instructed by your medical team. . Complete the entire course of an antibiotic. . Drink plenty of fluids and get plenty of rest. . Avoid close contacts especially the very young and the elderly . Cover your mouth if you cough or cough into your sleeve. . Always remember to wash your hands . A steam or ultrasonic humidifier can help congestion.   GET HELP RIGHT AWAY IF: . You develop worsening fever. . You become short of breath . You cough up blood. . Your symptoms persist after you have completed your treatment plan MAKE SURE YOU   Understand these instructions.  Will watch your condition.  Will get help right away if you are not doing well or get worse.  Your e-visit answers were reviewed by a board certified advanced clinical practitioner to complete your personal care plan.  Depending on the condition, your plan could have included both over the counter or prescription medications. If there is a problem please reply  once you have received a response from your provider. Your   safety is important to [place].  If you have drug allergies check your prescription carefully.    You can use MyChart to ask questions about today's visit, request a non-urgent call back, or ask for a work or school excuse for 24 hours related to this e-Visit. If it has been greater than 24 hours you will need to follow up with your provider, or enter a new e-Visit to address those concerns. You will get an e-mail in the next two days asking about your experience.  I hope that your e-visit has been valuable and will  speed your recovery. Thank you for using e-visits.  Greater than 5 minutes, yet less than 10 minutes of time have been spent researching, coordinating, and implementing care for this patient today.  Thank you for the details you included in the comment boxes. Those details are very helpful in determining the best course of treatment for you and help [place] to provide the best care.

## 2020-06-14 ENCOUNTER — Encounter: Payer: No Typology Code available for payment source | Admitting: Gastroenterology

## 2020-06-16 ENCOUNTER — Other Ambulatory Visit: Payer: Self-pay | Admitting: Physician Assistant

## 2020-06-16 MED FILL — LISINOPRIL 20 MG TABS: 20 | 30 days supply | Qty: 30 | Fill #0

## 2020-06-16 MED FILL — VYLIBRA 0.25-35 MG-MCG TABS: 0.25-35 | 84 days supply | Qty: 84 | Fill #0

## 2020-06-19 MED FILL — PROMETHAZINE 25 MG TABLET: 25 | 10 days supply | Qty: 30 | Fill #1

## 2020-06-26 ENCOUNTER — Other Ambulatory Visit (HOSPITAL_COMMUNITY): Payer: Self-pay | Admitting: Internal Medicine

## 2020-06-26 MED FILL — DAYTRANA 30 MG/9 HOUR PATCH: 30 | 30 days supply | Qty: 30 | Fill #0

## 2020-07-07 ENCOUNTER — Other Ambulatory Visit: Payer: Self-pay | Admitting: Physician Assistant

## 2020-07-07 ENCOUNTER — Encounter: Payer: Self-pay | Admitting: Physician Assistant

## 2020-07-07 MED ORDER — LOSARTAN POTASSIUM 50 MG PO TABS
50.0000 mg | ORAL_TABLET | Freq: Every day | ORAL | 1 refills | Status: DC
Start: 2020-07-07 — End: 2020-07-07

## 2020-07-07 MED FILL — LOSARTAN POTASSIUM 50 MG TA: 50 | 90 days supply | Qty: 90 | Fill #0

## 2020-07-26 ENCOUNTER — Other Ambulatory Visit (HOSPITAL_COMMUNITY): Payer: Self-pay | Admitting: Physician Assistant

## 2020-07-26 MED FILL — DAYTRANA 30 MG/9 HOUR PATCH: 30 | 30 days supply | Qty: 30 | Fill #0

## 2020-07-29 HISTORY — PX: COLONOSCOPY: SHX174

## 2020-08-25 ENCOUNTER — Other Ambulatory Visit (HOSPITAL_COMMUNITY): Payer: Self-pay | Admitting: Internal Medicine

## 2020-08-25 MED FILL — DAYTRANA 30 MG/9 HOUR PATCH: 30 | 30 days supply | Qty: 30 | Fill #0

## 2020-08-26 MED FILL — METOPROLOL SUCCINATE ER 25: 25 | 90 days supply | Qty: 90 | Fill #1

## 2020-09-14 MED FILL — TRIAMCINOLONE 0.1% CREAM: 0.1 | 15 days supply | Qty: 30 | Fill #0

## 2020-09-14 MED FILL — VYLIBRA 0.25-35 MG-MCG TABS: 0.25-35 | 84 days supply | Qty: 84 | Fill #1

## 2020-09-27 ENCOUNTER — Other Ambulatory Visit (HOSPITAL_COMMUNITY): Payer: Self-pay | Admitting: Internal Medicine

## 2020-09-27 DIAGNOSIS — R933 Abnormal findings on diagnostic imaging of other parts of digestive tract: Secondary | ICD-10-CM | POA: Diagnosis not present

## 2020-09-27 DIAGNOSIS — K5900 Constipation, unspecified: Secondary | ICD-10-CM | POA: Diagnosis not present

## 2020-09-27 DIAGNOSIS — R1084 Generalized abdominal pain: Secondary | ICD-10-CM | POA: Diagnosis not present

## 2020-09-27 DIAGNOSIS — R112 Nausea with vomiting, unspecified: Secondary | ICD-10-CM | POA: Diagnosis not present

## 2020-09-27 MED FILL — LINZESS 145 MCG CAPSULE: 145 | 30 days supply | Qty: 30 | Fill #0

## 2020-09-27 MED FILL — DAYTRANA 30 MG/9 HOUR PATCH: 30 | 30 days supply | Qty: 30 | Fill #0

## 2020-10-06 ENCOUNTER — Other Ambulatory Visit: Payer: Self-pay | Admitting: Physician Assistant

## 2020-10-06 ENCOUNTER — Telehealth: Payer: 59 | Admitting: Physician Assistant

## 2020-10-06 DIAGNOSIS — Z79899 Other long term (current) drug therapy: Secondary | ICD-10-CM | POA: Diagnosis not present

## 2020-10-06 DIAGNOSIS — J329 Chronic sinusitis, unspecified: Secondary | ICD-10-CM | POA: Diagnosis not present

## 2020-10-06 DIAGNOSIS — F900 Attention-deficit hyperactivity disorder, predominantly inattentive type: Secondary | ICD-10-CM | POA: Diagnosis not present

## 2020-10-06 MED ORDER — AMOXICILLIN-POT CLAVULANATE 875-125 MG PO TABS: 1.0000 | ORAL_TABLET | Freq: Two times a day (BID) | ORAL | 0 refills | Status: DC

## 2020-10-06 MED ORDER — FLUTICASONE PROPIONATE 50 MCG/ACT NA SUSP: 2.0000 | Freq: Every day | NASAL | 0 refills | Status: DC

## 2020-10-06 MED FILL — FLUTICASONE PROP 50 MCG SPR: 50 | 30 days supply | Qty: 16 | Fill #0

## 2020-10-06 MED FILL — AMOX-CLAV 875-125 MG TABLET: 875-125 | 7 days supply | Qty: 14 | Fill #0

## 2020-10-06 NOTE — Progress Notes (Signed)
We are sorry that you are not feeling well.  Here is how we plan to help!  Based on what you have shared with me it looks like you have sinusitis.  Sinusitis is inflammation and infection in the sinus cavities of the head.  Based on your presentation I believe you most likely have Acute Viral Sinusitis.This is an infection most likely caused by a virus. There is not specific treatment for viral sinusitis other than to help you with the symptoms until the infection runs its course.  You may use an oral decongestant such as Mucinex D or if you have glaucoma or high blood pressure use plain Mucinex. Saline nasal spray help and can safely be used as often as needed for congestion, I have prescribed: Fluticasone nasal spray two sprays in each nostril once a day. This medication is a steroid spray which helps reduce inflammation in your nasal passages.    Sometimes viral sinus infections can progress into bacterial sinus infections. Usually this only occurs after symptoms have been present for 1 week or if you develop a fever. If your symptoms persist past 7 days or if you develop a fever, I have prescribed an antibiotic, Augmentin, to take twice daily for your symptoms. Please monitor symptoms and do not fill the prescription unless your symptoms persist or progress.   Some authorities believe that zinc sprays or the use of Echinacea may shorten the course of your symptoms.  Sinus infections are not as easily transmitted as other respiratory infection, however we still recommend that you avoid close contact with loved ones, especially the very young and elderly.  Remember to wash your hands thoroughly throughout the day as this is the number one way to prevent the spread of infection!  Home Care:  Only take medications as instructed by your medical team.  Do not take these medications with alcohol.  A steam or ultrasonic humidifier can help congestion.  You can place a towel over your head and breathe in  the steam from hot water coming from a faucet.  Avoid close contacts especially the very young and the elderly.  Cover your mouth when you cough or sneeze.  Always remember to wash your hands.  Get Help Right Away If:  You develop worsening fever or sinus pain.  You develop a severe head ache or visual changes.  Your symptoms persist after you have completed your treatment plan.  Make sure you  Understand these instructions.  Will watch your condition.  Will get help right away if you are not doing well or get worse.  Your e-visit answers were reviewed by a board certified advanced clinical practitioner to complete your personal care plan.  Depending on the condition, your plan could have included both over the counter or prescription medications.  If there is a problem please reply  once you have received a response from your provider.  Your safety is important to [place].  If you have drug allergies check your prescription carefully.    You can use MyChart to ask questions about today's visit, request a non-urgent call back, or ask for a work or school excuse for 24 hours related to this e-Visit. If it has been greater than 24 hours you will need to follow up with your provider, or enter a new e-Visit to address those concerns.  You will get an e-mail in the next two days asking about your experience.  I hope that your e-visit has been valuable and will speed  your recovery. Thank you for using e-visits.  Approximately 5 minutes was spent documenting and reviewing patient's chart.

## 2020-10-12 ENCOUNTER — Ambulatory Visit: Payer: No Typology Code available for payment source | Admitting: Cardiovascular Disease

## 2020-10-12 NOTE — Progress Notes (Deleted)
Cardiology Office Note   Date:  10/12/2020   ID:  Madison, Robertson 1991-02-06, MRN [id number]  PCP:  Inda Coke, [place]  Cardiologist:   Skeet Latch, MD   No chief complaint on file.    History of Present Illness: Madison Robertson is a 30 y.o. female with hypertension, hyperlipidemia, prediabetes, asthma, anxiety and edema here for follow-up. She was initially seen 01/2020 forthe evaluation of tachycardia and family history of CAD.  She saw Ms.Worley on 10/2019 and noted that her heart rate was sporadically over 120 at rest.  She received notifications from her [place] watch.  She note that her heart rate has been fast since her teens.  Her pediatrician noted that it was due to her ADHD medication.  Her heart rate came down the 90s when it was held.  She started back on the medication and her rate has been consistently in the 100s since then. She was minimally bothered by the symptoms. She had an echocardiogram 01/2020 that revealed LVEF 60 to 65% with normal diastolic function. She also reported lower extremity edema but there is no evidence of volume overload on her echo. She reported some bradycardia and dizziness after her COVID-19 vaccine.  Ms. Sawyer was first diagnosed with hypertension around age 21.  For the most part it has been controlled. She was instructed to work on diet and exercise. She also has a family history of CAD and was going to consider getting a coronary calcium score.  A few days after she got her echo she had severe abdominal pain and went to the ED.  She was thought to have Chron's disease.  She is going to see GI for evaluation today.  Trulicity was stopped and she has been gaining weight.  She has been using her treadmill 30-40 minutes twice daily.  She is also losing weight.  She has also been working on her diet.  She rarely eats red meat and is eating more fruit and vegetables.  This has upset her stomach upset.  She notices that her heart rate is starting to  increase again.  Last week it was 150 bpm.  Her BP has been mostly 110s/60s.  At her last appointment metoprolol was added to her regimen.  She discussed concerns about her father recently passing of an MI.  Her chest CT was reviewed and there is no coronary calcification.  Therefore calcium scoring was not performed and we discussed primary prevention.   Past Medical History:  Diagnosis Date  . ADD (attention deficit disorder)   . Allergy   . Anxiety   . Asthma    childhood asthma - pt out grew  . CAD in native artery 04/14/2020  . Depression   . Edema, lower extremity   . Heart murmur   . Hyperlipidemia    Has never been on medication for this  . Hypertension    Lisinopril-HCTZ  . IBS (irritable bowel syndrome)   . Lactose intolerance   . Migraines    well-controlled when blood pressure is controlled  . Morbid obesity ([place]) 02/07/2020  . Palpitations 02/07/2020  . PCOS (polycystic ovarian syndrome)    10 years; was on metformin for 2 months but didn't like the way it made her feel; has never been on spironolactone  . Pre-diabetes    Hemoglobin A1c 6.0 in September 2019  . Pure hypercholesterolemia 02/07/2020    Past Surgical History:  Procedure Laterality Date  . WISDOM TOOTH EXTRACTION  Feb 2017     Current Outpatient Medications  Medication Sig Dispense Refill  . aluminum chloride (DRYSOL) 20 % external solution Apply topically at bedtime. 35 mL 1  . amoxicillin-clavulanate (AUGMENTIN) 875-125 MG tablet Take 1 tablet by mouth 2 (two) times daily for 7 days. 14 tablet 0  . benzonatate (TESSALON) 100 MG capsule Take 2 capsules (200 mg total) by mouth 3 (three) times daily as needed for cough. 20 capsule 0  . dicyclomine (BENTYL) 20 MG tablet TAKE 1 TABLET BY MOUTH 3 TIMES DAILY AS NEEDED FOR SPASMS 30 tablet 1  . fluticasone (FLONASE) 50 MCG/ACT nasal spray Place 2 sprays into both nostrils daily. 16 g 0  . furosemide (LASIX) 20 MG tablet TAKE 1 TABLET (20 MG TOTAL) BY  MOUTH DAILY AS NEEDED FOR FLUID. 30 tablet 1  . ibuprofen (ADVIL,MOTRIN) 200 MG tablet Take 400 mg by mouth every 6 (six) hours as needed for fever, headache, moderate pain or cramping.     Marland Kitchen losartan (COZAAR) 50 MG tablet Take 1 tablet (50 mg total) by mouth daily. 90 tablet 1  . methylphenidate (DAYTRANA) 30 MG/9HR     . metoprolol succinate (TOPROL XL) 25 MG 24 hr tablet Take 1 tablet (25 mg total) by mouth daily. 90 tablet 3  . potassium chloride SA (K-DUR) 20 MEQ tablet Take 1 tablet every time you take a lasix tablet. 30 tablet 0  . predniSONE (STERAPRED UNI-PAK 21 TAB) 10 MG (21) TBPK tablet As directed 21 tablet 0  . promethazine (PHENERGAN) 25 MG tablet Take 1 tablet (25 mg total) by mouth every 8 (eight) hours as needed. 30 tablet 1  . triamcinolone cream (KENALOG) 0.1 % APPLY to affected area TOPICALLY DAILY AS NEEDED FOR ECZEMA 30 g 1  . VYLIBRA 0.25-35 MG-MCG tablet TAKE 1 TABLET BY MOUTH DAILY. 28 tablet 5   Current Facility-Administered Medications  Medication Dose Route Frequency Provider Last Rate Last Admin  . 0.9 %  sodium chloride infusion  500 mL Intravenous Continuous Danis, Kirke Corin, MD        Allergies:   Amlodipine and Hydrocodone    Social History:  The patient  reports that she has never smoked. She has never used smokeless tobacco. She reports current alcohol use. She reports that she does not use drugs.   Family History:  The patient's family history includes Anxiety disorder in her mother; Atrial fibrillation in her father; Breast cancer in an other family member; Colon cancer in her maternal grandfather; Colon polyps in her mother; Congestive Heart Failure in her father; Depression in her mother; Diabetes in her father and mother; Esophageal cancer in her maternal grandmother and maternal uncle; Heart attack in her father; Heart disease in her father; Heart failure in her mother; High Cholesterol in her mother; Hypertension in her father, maternal grandfather,  maternal grandmother, mother, paternal grandfather, and paternal grandmother; Obesity in her mother; Ovarian cancer in an other family member; Prostate cancer in her paternal grandfather; Uterine cancer in an other family member.    ROS:  Please see the history of present illness.   Otherwise, review of systems are positive for none.   All other systems are reviewed and negative.    PHYSICAL EXAM: VS:  There were no vitals taken for this visit. , BMI There is no height or weight on file to calculate BMI. GENERAL:  Well appearing HEENT:  Pupils equal round and reactive, fundi not visualized, oral mucosa unremarkable NECK:  No  jugular venous distention, waveform within normal limits, carotid upstroke brisk and symmetric, no bruits LUNGS:  Clear to auscultation bilaterally HEART:  RRR.  PMI not displaced or sustained,S1 and S2 within normal limits, no S3, no S4, no clicks, no rubs, no murmurs ABD:  Flat, positive bowel sounds normal in frequency in pitch, no bruits, no rebound, no guarding, no midline pulsatile mass, no hepatomegaly, no splenomegaly EXT:  2 plus pulses throughout, trace edema, no cyanosis no clubbing SKIN:  No rashes no nodules NEURO:  Cranial nerves II through XII grossly intact, motor grossly intact throughout PSYCH:  Cognitively intact, oriented to person place and time   EKG:  EKG is not ordered today. The ekg ordered 02/07/20 demonstrates sinus tachycardia.  Rate 109 bpm.   Echo 02/25/2020:  1. Normal coronary artery origins.  2. Left ventricular ejection fraction, by estimation, is 60 to 65%. The  left ventricle has normal function. The left ventricle has no regional  wall motion abnormalities. Left ventricular diastolic parameters were  normal.  3. Right ventricular systolic function is normal. The right ventricular  size is normal.  4. The mitral valve is normal in structure. Trivial mitral valve  regurgitation. No evidence of mitral stenosis.  5. The aortic  valve is normal in structure. Aortic valve regurgitation is  not visualized. No aortic stenosis is present.  6. The inferior vena cava is normal in size with greater than 50%  respiratory variability, suggesting right atrial pressure of 3 mmHg.   Recent Labs: 02/07/2020: BUN 5; Creatinine, Ser 0.74; Potassium 4.2; Sodium 142    Lipid Panel    Component Value Date/Time   CHOL 202 (H) 11/05/2019 0949   CHOL 177 01/25/2019 1145   TRIG 139.0 11/05/2019 0949   HDL 47.50 11/05/2019 0949   HDL 54 01/25/2019 1145   CHOLHDL 4 11/05/2019 0949   VLDL 27.8 11/05/2019 0949   LDLCALC 127 (H) 11/05/2019 0949   LDLCALC 109 (H) 01/25/2019 1145      Wt Readings from Last 3 Encounters:  04/14/20 283 lb (128.4 kg)  04/14/20 283 lb 9.6 oz (128.6 kg)  02/07/20 276 lb (125.2 kg)      ASSESSMENT AND PLAN:  # Sinus tachycardia: Ms. Carnegie has resting tachycardia.  This is likely attributable to her ADHD medication, as it subsided when she stopped it and recurrent once it was restarted.  However it is necessary for her management.  Echo showed no evidence of tachycardia mediated cardiomyopathy.  She has bothered by her tachycardia.  We will start metoprolol succinate 25 mg daily.  TSH and blood counts are within normal limits.    # Family history of CAD: # Hyperlipidemia:  # Morbid obesity:  Father died of MI recently.  There is no coronary artery disease on her CT in 2019.  No utility in the calcium score.  Adding metoprolol as above.  Continue with primary prevention with diet and exercise.  LDL 127 on 10/2019.  She is limiting red meats and working on her diet.  # Hypertension:   Blood pressure r is well-controlled.  Continue lisinopril and adding metoprolol as above.  # LE edema:   Likely venous stasis.  There is no systolic or diastolic dysfunction on echo and a right atrial pressure was 3 mmHg.   Current medicines are reviewed at length with the patient today.  The patient does not have  concerns regarding medicines.  The following changes have been made: And metoprolol  Labs/ tests ordered today  include:   No orders of the defined types were placed in this encounter.    Disposition:   FU with Tiffany C. Oval Linsey, MD, [facility] in 6 months    Signed, Tiffany C. Oval Linsey, MD, [facility]  10/12/2020 8:01 AM    [place]

## 2020-10-18 DIAGNOSIS — R933 Abnormal findings on diagnostic imaging of other parts of digestive tract: Secondary | ICD-10-CM | POA: Diagnosis not present

## 2020-10-18 DIAGNOSIS — K635 Polyp of colon: Secondary | ICD-10-CM | POA: Diagnosis not present

## 2020-10-18 DIAGNOSIS — D122 Benign neoplasm of ascending colon: Secondary | ICD-10-CM | POA: Diagnosis not present

## 2020-10-18 LAB — HM COLONOSCOPY

## 2020-10-26 ENCOUNTER — Other Ambulatory Visit (HOSPITAL_COMMUNITY): Payer: Self-pay | Admitting: Internal Medicine

## 2020-10-26 ENCOUNTER — Encounter: Payer: Self-pay | Admitting: Physician Assistant

## 2020-10-28 ENCOUNTER — Other Ambulatory Visit (HOSPITAL_COMMUNITY): Payer: Self-pay

## 2020-10-28 MED FILL — Linaclotide Cap 145 MCG: ORAL | 90 days supply | Qty: 90 | Fill #0 | Status: AC

## 2020-10-28 MED FILL — Methylphenidate TD Patch 30 MG/9HR: TRANSDERMAL | 30 days supply | Qty: 30 | Fill #0 | Status: AC

## 2020-10-29 ENCOUNTER — Other Ambulatory Visit (HOSPITAL_COMMUNITY): Payer: Self-pay

## 2020-10-30 ENCOUNTER — Other Ambulatory Visit (HOSPITAL_COMMUNITY): Payer: Self-pay

## 2020-11-01 ENCOUNTER — Other Ambulatory Visit (HOSPITAL_COMMUNITY): Payer: Self-pay

## 2020-11-07 ENCOUNTER — Encounter: Payer: Self-pay | Admitting: Physician Assistant

## 2020-11-14 ENCOUNTER — Ambulatory Visit: Payer: 59 | Admitting: Physician Assistant

## 2020-11-14 ENCOUNTER — Other Ambulatory Visit (HOSPITAL_COMMUNITY): Payer: Self-pay

## 2020-11-14 ENCOUNTER — Encounter: Payer: Self-pay | Admitting: Physician Assistant

## 2020-11-14 ENCOUNTER — Other Ambulatory Visit: Payer: Self-pay

## 2020-11-14 VITALS — BP 134/96 | HR 113 | Temp 98.7°F | Ht 67.0 in | Wt 296.8 lb

## 2020-11-14 DIAGNOSIS — R7303 Prediabetes: Secondary | ICD-10-CM

## 2020-11-14 DIAGNOSIS — I10 Essential (primary) hypertension: Secondary | ICD-10-CM

## 2020-11-14 MED ORDER — TRULICITY 0.75 MG/0.5ML ~~LOC~~ SOAJ
0.7500 mg | SUBCUTANEOUS | 4 refills | Status: DC
  Filled 2020-11-14 – 2020-11-29 (×2): qty 2, 28d supply, fill #0
  Filled 2020-12-20 – 2020-12-21 (×2): qty 2, 28d supply, fill #1
  Filled 2021-01-24: qty 6, 84d supply, fill #2

## 2020-11-14 NOTE — Progress Notes (Signed)
Madison Robertson is a 30 y.o. female is here to discuss: GI issues and medications   History of Present Illness:   Chief Complaint  Patient presents with  . Discuss Medications    Wants to go back on Trulicity.    HPI   Prediabetes/Insulin resistance Was on Trulicity 7.82 mg weekly around summer of 2021 but this was stopped because she was having issues with her GI tract and she was told to stop this out of an abundance of caution. She felt like this medication was helpful and would like to restart. Helped keep her full. Denies any significant diarrhea with this medication. She is trying hard to lose weight.  Lab Results  Component Value Date   HGBA1C 5.9 11/05/2019    HTN Currently taking metoprolol 25 mg daily, losartan 50 mg daily. Takes lasix 20 mg 1-2 times per month. At home blood pressure readings are: well controlled per patient. Patient denies chest pain, SOB, blurred vision, dizziness, unusual headaches, lower leg swelling. Patient is compliant with medication. Denies excessive caffeine intake, stimulant usage, excessive alcohol intake, or increase in salt consumption.  BP Readings from Last 3 Encounters:  11/14/20 (!) 134/96  04/14/20 138/90  04/14/20 130/78     Health Maintenance Due  Topic Date Due  . Hepatitis C Screening  Never done  . HIV Screening  Never done  . PAP SMEAR-Modifier  05/20/2017    Past Medical History:  Diagnosis Date  . ADD (attention deficit disorder)   . Allergy   . Anxiety   . Asthma    childhood asthma - pt out grew  . CAD in native artery 04/14/2020  . Depression   . Edema, lower extremity   . Heart murmur   . Hyperlipidemia    Has never been on medication for this  . Hypertension    Lisinopril-HCTZ  . IBS (irritable bowel syndrome)   . Lactose intolerance   . Migraines    well-controlled when blood pressure is controlled  . Morbid obesity ([place]) 02/07/2020  . Palpitations 02/07/2020  . PCOS (polycystic ovarian syndrome)     10 years; was on metformin for 2 months but didn't like the way it made her feel; has never been on spironolactone  . Pre-diabetes    Hemoglobin A1c 6.0 in September 2019  . Pure hypercholesterolemia 02/07/2020     Social History   Tobacco Use  . Smoking status: Never Smoker  . Smokeless tobacco: Never Used  Vaping Use  . Vaping Use: Never used  Substance Use Topics  . Alcohol use: Yes    Comment: occassionally  . Drug use: No    Past Surgical History:  Procedure Laterality Date  . WISDOM TOOTH EXTRACTION     Feb 2017    Family History  Problem Relation Age of Onset  . Diabetes Mother   . Hypertension Mother   . Colon polyps Mother   . High Cholesterol Mother   . Depression Mother   . Anxiety disorder Mother   . Obesity Mother   . Heart failure Mother   . Diabetes Father   . Hypertension Father   . Congestive Heart Failure Father   . Heart disease Father   . Atrial fibrillation Father   . Heart attack Father   . Prostate cancer Paternal Grandfather   . Hypertension Paternal Grandfather   . Breast cancer Other        Paternal great aunt,distal cousin,   . Ovarian cancer Other  Paternal great grandmother  . Uterine cancer Other   . Colon cancer Maternal Grandfather   . Hypertension Maternal Grandfather   . Esophageal cancer Maternal Grandmother   . Hypertension Maternal Grandmother   . Esophageal cancer Maternal Uncle   . Hypertension Paternal Grandmother   . Pancreatic cancer Neg Hx   . Rectal cancer Neg Hx   . Stomach cancer Neg Hx     PMHx, SurgHx, SocialHx, FamHx, Medications, and Allergies were reviewed in the Visit Navigator and updated as appropriate.   Patient Active Problem List   Diagnosis Date Noted  . Nausea and vomiting 04/14/2020  . Generalized abdominal pain 04/14/2020  . Abnormal CT scan, small bowel 04/14/2020  . Pure hypercholesterolemia 02/07/2020  . Morbid obesity ([place]) 02/07/2020  . Palpitations 02/07/2020  . ADD  (attention deficit disorder) 06/29/2018  . Anxiety 06/29/2018  . Depression 06/29/2018  . Essential hypertension 06/29/2018  . IBS (irritable bowel syndrome) 06/29/2018  . PCOS (polycystic ovarian syndrome) 06/29/2018    Social History   Tobacco Use  . Smoking status: Never Smoker  . Smokeless tobacco: Never Used  Vaping Use  . Vaping Use: Never used  Substance Use Topics  . Alcohol use: Yes    Comment: occassionally  . Drug use: No    Current Medications and Allergies:    Current Outpatient Medications:  .  aluminum chloride (DRYSOL) 20 % external solution, Apply topically at bedtime., Disp: 35 mL, Rfl: 1 .  dicyclomine (BENTYL) 20 MG tablet, TAKE 1 TABLET BY MOUTH 3 TIMES DAILY AS NEEDED FOR SPASMS, Disp: 30 tablet, Rfl: 1 .  Dulaglutide (TRULICITY) 9.67 RF/1.6BW SOPN, Inject 0.75 mg into the skin once a week., Disp: 2 mL, Rfl: 4 .  fluticasone (FLONASE) 50 MCG/ACT nasal spray, PLACE 2 SPRAYS INTO BOTH NOSTRILS DAILY., Disp: 16 g, Rfl: 0 .  furosemide (LASIX) 20 MG tablet, TAKE 1 TABLET (20 MG TOTAL) BY MOUTH DAILY AS NEEDED FOR FLUID., Disp: 30 tablet, Rfl: 1 .  ibuprofen (ADVIL,MOTRIN) 200 MG tablet, Take 400 mg by mouth every 6 (six) hours as needed for fever, headache, moderate pain or cramping. , Disp: , Rfl:  .  linaclotide (LINZESS) 145 MCG CAPS capsule, TAKE 1 CAPSULE BY MOUTH ONCE A DAY, Disp: 30 capsule, Rfl: 3 .  losartan (COZAAR) 50 MG tablet, TAKE 1 TABLET BY MOUTH ONCE A DAY, Disp: 90 tablet, Rfl: 1 .  methylphenidate (DAYTRANA) 30 MG/9HR, APPLY 1 PATCH ONTO THE SKIN DAILY AS DIRECTED, Disp: 30 patch, Rfl: 0 .  metoprolol succinate (TOPROL-XL) 25 MG 24 hr tablet, TAKE 1 TABLET BY MOUTH DAILY, Disp: 90 tablet, Rfl: 3 .  norgestimate-ethinyl estradiol (ORTHO-CYCLEN) 0.25-35 MG-MCG tablet, TAKE 1 TABLET BY MOUTH DAILY., Disp: 28 tablet, Rfl: 5 .  promethazine (PHENERGAN) 25 MG tablet, TAKE 1 TABLET BY MOUTH EVERY 8 HOURS AS NEEDED, Disp: 30 tablet, Rfl: 1 .   triamcinolone (KENALOG) 0.1 %, APPLY TO AFFECTED AREA TOPICALLY DAILY AS NEEDED FOR ECZEMA, Disp: 30 g, Rfl: 1  Current Facility-Administered Medications:  .  0.9 %  sodium chloride infusion, 500 mL, Intravenous, Continuous, Danis, Kirke Corin, MD   Allergies  Allergen Reactions  . Amlodipine     edema  . Elderberry     Throat swelling  . Hydrocodone Nausea And Vomiting    Review of Systems   ROS Negative unless otherwise specified per HPI.  Vitals:   Vitals:   11/14/20 1428  BP: (!) 134/96  Pulse: (!) 113  Temp: 98.7 F (37.1 C)  TempSrc: Temporal  SpO2: 99%  Weight: 296 lb 12.8 oz (134.6 kg)  Height: 5\' 7"  (1.702 m)     Body mass index is 46.49 kg/m.   Physical Exam:    Physical Exam Vitals and nursing note reviewed.  Constitutional:      General: She is not in acute distress.    Appearance: She is well-developed. She is not ill-appearing or toxic-appearing.  Cardiovascular:     Rate and Rhythm: Normal rate and regular rhythm.     Pulses: Normal pulses.     Heart sounds: Normal heart sounds, S1 normal and S2 normal.     Comments: No LE edema Pulmonary:     Effort: Pulmonary effort is normal.     Breath sounds: Normal breath sounds.  Skin:    General: Skin is warm and dry.  Neurological:     Mental Status: She is alert.     GCS: GCS eye subscore is 4. GCS verbal subscore is 5. GCS motor subscore is 6.  Psychiatric:        Speech: Speech normal.        Behavior: Behavior normal. Behavior is cooperative.      Assessment and Plan:    Sergio was seen today for discuss medications.  Diagnoses and all orders for this visit:  Prediabetes Update A1c today. Restart Trulicity 2.70 mg weekly.  Permission to increase after 1 month to 1.5 mg weekly if desired. Follow-up in 6 months, sooner if concerns. -     Hemoglobin A1c  Essential hypertension Well controlled per patient report. Continue metoprolol XL 25 mg daily, losartan 50 mg daily. Follow-up  in 6 months, sooner if concerns. -     CBC with Differential/Platelet -     Comprehensive metabolic panel  Other orders -     Dulaglutide (TRULICITY) 6.23 JS/2.8BT SOPN; Inject 0.75 mg into the skin once a week.   CMA or LPN served as scribe during this visit. History, Physical, and Plan performed by medical provider. The above documentation has been reviewed and is accurate and complete.   Inda Coke, PA-C [place], [place] 11/14/2020  Follow-up: No follow-ups on file.

## 2020-11-14 NOTE — Patient Instructions (Signed)
It was great to see you!  We will update your blood work today.  I have refilled your trulicity to start back up at 0.75 mg. May increase to 1.5 mg after 4 weeks if desired, just let us know.  Let's follow-up in 6 months, sooner if you have concerns.  Take care,  Inda Coke PA-C

## 2020-11-15 LAB — CBC WITH DIFFERENTIAL/PLATELET
Basophils Absolute: 0.1 10*3/uL (ref 0.0–0.1)
Basophils Relative: 1.2 % (ref 0.0–3.0)
Eosinophils Absolute: 0.1 10*3/uL (ref 0.0–0.7)
Eosinophils Relative: 0.9 % (ref 0.0–5.0)
HCT: 39.1 % (ref 36.0–46.0)
Hemoglobin: 13 g/dL (ref 12.0–15.0)
Lymphocytes Relative: 27.4 % (ref 12.0–46.0)
Lymphs Abs: 2.7 10*3/uL (ref 0.7–4.0)
MCHC: 33.3 g/dL (ref 30.0–36.0)
MCV: 83.6 fl (ref 78.0–100.0)
Monocytes Absolute: 0.7 10*3/uL (ref 0.1–1.0)
Monocytes Relative: 7.2 % (ref 3.0–12.0)
Neutro Abs: 6.2 10*3/uL (ref 1.4–7.7)
Neutrophils Relative %: 63.3 % (ref 43.0–77.0)
Platelets: 211 10*3/uL (ref 150.0–400.0)
RBC: 4.67 Mil/uL (ref 3.87–5.11)
RDW: 15.1 % (ref 11.5–15.5)
WBC: 9.8 10*3/uL (ref 4.0–10.5)

## 2020-11-15 LAB — HEMOGLOBIN A1C: Hgb A1c MFr Bld: 6.1 % (ref 4.6–6.5)

## 2020-11-15 LAB — COMPREHENSIVE METABOLIC PANEL
ALT: 9 U/L (ref 0–35)
AST: 15 U/L (ref 0–37)
Albumin: 3.8 g/dL (ref 3.5–5.2)
Alkaline Phosphatase: 89 U/L (ref 39–117)
BUN: 7 mg/dL (ref 6–23)
CO2: 28 mEq/L (ref 19–32)
Calcium: 9.1 mg/dL (ref 8.4–10.5)
Chloride: 106 mEq/L (ref 96–112)
Creatinine, Ser: 0.78 mg/dL (ref 0.40–1.20)
GFR: 100.85 mL/min (ref >60.00)
Glucose, Bld: 93 mg/dL (ref 70–99)
Potassium: 3.9 mEq/L (ref 3.5–5.1)
Sodium: 141 mEq/L (ref 135–145)
Total Bilirubin: 0.2 mg/dL (ref 0.2–1.2)
Total Protein: 7.4 g/dL (ref 6.0–8.3)

## 2020-11-17 ENCOUNTER — Other Ambulatory Visit (HOSPITAL_COMMUNITY): Payer: Self-pay

## 2020-11-23 ENCOUNTER — Other Ambulatory Visit (HOSPITAL_COMMUNITY): Payer: Self-pay

## 2020-11-28 ENCOUNTER — Other Ambulatory Visit (HOSPITAL_COMMUNITY): Payer: Self-pay

## 2020-11-28 MED ORDER — DAYTRANA 30 MG/9HR TD PTCH
MEDICATED_PATCH | TRANSDERMAL | 0 refills | Status: DC
  Filled 2020-11-28: qty 30, 30d supply, fill #0

## 2020-11-29 ENCOUNTER — Other Ambulatory Visit (HOSPITAL_COMMUNITY): Payer: Self-pay

## 2020-11-29 MED FILL — Losartan Potassium Tab 50 MG: ORAL | 90 days supply | Qty: 90 | Fill #0 | Status: AC

## 2020-12-15 ENCOUNTER — Other Ambulatory Visit: Payer: Self-pay | Admitting: Physician Assistant

## 2020-12-15 MED FILL — Triamcinolone Acetonide Cream 0.1%: CUTANEOUS | 30 days supply | Qty: 30 | Fill #0 | Status: AC

## 2020-12-16 ENCOUNTER — Other Ambulatory Visit (HOSPITAL_COMMUNITY): Payer: Self-pay

## 2020-12-18 ENCOUNTER — Other Ambulatory Visit (HOSPITAL_COMMUNITY): Payer: Self-pay

## 2020-12-18 MED ORDER — NORGESTIMATE-ETH ESTRADIOL 0.25-35 MG-MCG PO TABS
1.0000 | ORAL_TABLET | Freq: Every day | ORAL | 5 refills | Status: DC
  Filled 2020-12-18: qty 28, 28d supply, fill #0
  Filled 2021-01-15 – 2021-01-25 (×2): qty 28, 28d supply, fill #1
  Filled 2021-02-21: qty 28, 28d supply, fill #2
  Filled 2021-03-18: qty 28, 28d supply, fill #3
  Filled 2021-04-21: qty 28, 28d supply, fill #4
  Filled 2021-05-19: qty 28, 28d supply, fill #5

## 2020-12-21 ENCOUNTER — Other Ambulatory Visit (HOSPITAL_COMMUNITY): Payer: Self-pay

## 2020-12-22 ENCOUNTER — Other Ambulatory Visit (HOSPITAL_COMMUNITY): Payer: Self-pay

## 2020-12-23 ENCOUNTER — Other Ambulatory Visit (HOSPITAL_COMMUNITY): Payer: Self-pay

## 2020-12-26 ENCOUNTER — Other Ambulatory Visit (HOSPITAL_COMMUNITY): Payer: Self-pay

## 2020-12-26 MED ORDER — DAYTRANA 30 MG/9HR TD PTCH
MEDICATED_PATCH | TRANSDERMAL | 0 refills | Status: DC
  Filled 2020-12-29: qty 30, 30d supply, fill #0

## 2020-12-28 ENCOUNTER — Other Ambulatory Visit (HOSPITAL_COMMUNITY): Payer: Self-pay

## 2020-12-28 ENCOUNTER — Telehealth: Payer: 59 | Admitting: Orthopedic Surgery

## 2020-12-28 DIAGNOSIS — W57XXXA Bitten or stung by nonvenomous insect and other nonvenomous arthropods, initial encounter: Secondary | ICD-10-CM

## 2020-12-28 DIAGNOSIS — S80269A Insect bite (nonvenomous), unspecified knee, initial encounter: Secondary | ICD-10-CM

## 2020-12-28 MED ORDER — HYDROXYZINE PAMOATE 25 MG PO CAPS
25.0000 mg | ORAL_CAPSULE | Freq: Four times a day (QID) | ORAL | 0 refills | Status: DC | PRN
  Filled 2020-12-28: qty 30, 8d supply, fill #0

## 2020-12-28 MED FILL — Metoprolol Succinate Tab ER 24HR 25 MG (Tartrate Equiv): ORAL | 90 days supply | Qty: 90 | Fill #0 | Status: AC

## 2020-12-28 MED FILL — Fluticasone Propionate Nasal Susp 50 MCG/ACT: NASAL | 30 days supply | Qty: 16 | Fill #0 | Status: AC

## 2020-12-28 NOTE — Progress Notes (Signed)
Thank you for describing your tick bite, Here is how we plan to help! Based on the information that you shared with me it looks like you have An uncomplicated tick bite that just occurred and can be closely follow using the instructions in your care plan.   We can try another antihistamine for the itching. Stop taking the benadryl while taking this medicine.  In most cases a tick bite is painless and does not itch.  Most tick bites in which the tick is quickly removed do not require prescriptions. Ticks can transmit several diseases if they are infected and remain attacked to your skin. Therefore the length that the tick was attached and any symptoms you have experienced after the bite are import to accurately develop your custom treatment plan. In most cases a single dose of doxycycline may prevent the development of a more serious condition.  Based on your information I have Provided a home care guide for tick bites and  instructions on when to call for help.  Which ticks  are associated with illness?  The Wood Tick (dog tick) is the size of a watermelon seed and can sometimes transmit [facility] spotted fever and [place] tick fever.   The Deer Tick (black-legged tick) is between the size of a poppy seed (pin head) and an apple seed, and can sometimes transmit Lyme disease.  A brown to black tick with a white splotch on its back is likely a female Amblyomma americanum ([place] tick). This tick has been associated with [place] Tick Associated illness ( STARI)  Lyme disease has become the most common tick-borne illness in the [place]. The risk of Lyme disease following a recognized deer tick bite is estimated to be 1%.  The majority of cases of Lyme disease start with a bull's eye rash at the site of the tick bite. The rash can occur days to weeks (typically 7-10 days) after a tick bite. Treatment with antibiotics is indicated if this rash appears. Flu-like symptoms may accompany  the rash, including: fever, chills, headaches, muscle aches, and fatigue. Removing ticks promptly may prevent tick borne disease.  What can be used to prevent Tick Bites?   Insect repellant with at leas 20% DEET.  Wearing long pants with sock and shoes.  Avoiding tall grass and heavily wooded areas.  Checking your skin after being outdoors.  Shower with a washcloth after outdoor exposures.  HOME CARE ADVICE FOR TICK BITE  1. Wood Tick Removal:  o Use a pair of tweezers and grasp the wood tick close to the skin (on its head). Pull the wood tick straight upward without twisting or crushing it. Maintain a steady pressure until it releases its grip.   o If tweezers aren't available, use fingers, a loop of thread around the jaws, or a needle between the jaws for traction.  o Note: covering the tick with petroleum jelly, nail polish or rubbing alcohol doesn't work. Neither does touching the tick with a hot or cold object. 2. Tiny Deer Tick Removal:   o Needs to be scraped off with a knife blade or credit card edge. o Place tick in a sealed container (e.g. glass jar, zip lock plastic bag), in case your doctor wants to see it. 3. Tick's Head Removal:  o If the wood tick's head breaks off in the skin, it must be removed. Clean the skin. Then use a sterile needle to uncover the head and lift it out or scrape it off.  o If a very small piece of the head remains, the skin will eventually slough it off. 4. Antibiotic Ointment:  o Wash the wound and your hands with soap and water after removal to prevent catching any tick disease.  Apply an over the counter antibiotic ointment (e.g. bacitracin) to the bite once. 5. Expected Course: Tick bites normally don't itch or hurt. That's why they often go unnoticed. 6. Call Your Doctor If:  o You can't remove the tick or the tick's head o Fever, a severe head ache, or rash occur in the next 2 weeks o Bite begins to look infected o Lyme's disease is common  in your area o You have not had a tetanus in the last 10 years o Your current symptoms become worse    MAKE SURE YOU   Understand these instructions.  Will watch your condition.  Will get help right away if you are not doing well or get worse.   Thank you for choosing an e-visit.  Your e-visit answers were reviewed by a board certified advanced clinical practitioner to complete your personal care plan. Depending upon the condition, your plan could have included both over the counter or prescription medications. Please review your pharmacy choice. If there is a problem you may use MyChart messaging to have the prescription routed to another pharmacy. Your safety is important to [place]. If you have drug allergies check your prescription carefully.   You can use MyChart to ask questions about today's visit, request a non-urgent call back, or ask for a work or school excuse for 24 hours related to this e-Visit. If it has been greater than 24 hours you will need to follow up with your provider, or enter a new e-Visit to address those concerns.  You will get an email in the next two days asking about your experience. I hope  that your e-visit has been valuable and will speed your recovery  Greater than 5 minutes, yet less than 10 minutes of time have been spent researching, coordinating and implementing care for this patient today.

## 2020-12-29 ENCOUNTER — Other Ambulatory Visit (HOSPITAL_COMMUNITY): Payer: Self-pay

## 2021-01-03 DIAGNOSIS — Z79899 Other long term (current) drug therapy: Secondary | ICD-10-CM | POA: Diagnosis not present

## 2021-01-03 DIAGNOSIS — F902 Attention-deficit hyperactivity disorder, combined type: Secondary | ICD-10-CM | POA: Diagnosis not present

## 2021-01-03 DIAGNOSIS — F900 Attention-deficit hyperactivity disorder, predominantly inattentive type: Secondary | ICD-10-CM | POA: Diagnosis not present

## 2021-01-16 ENCOUNTER — Other Ambulatory Visit (HOSPITAL_COMMUNITY): Payer: Self-pay

## 2021-01-18 ENCOUNTER — Encounter: Payer: Self-pay | Admitting: Family Medicine

## 2021-01-24 ENCOUNTER — Other Ambulatory Visit (HOSPITAL_COMMUNITY): Payer: Self-pay

## 2021-01-25 ENCOUNTER — Other Ambulatory Visit (HOSPITAL_COMMUNITY): Payer: Self-pay

## 2021-01-25 MED ORDER — DAYTRANA 30 MG/9HR TD PTCH
MEDICATED_PATCH | TRANSDERMAL | 0 refills | Status: DC
  Filled 2021-01-25: qty 30, 30d supply, fill #0

## 2021-01-26 ENCOUNTER — Other Ambulatory Visit (HOSPITAL_COMMUNITY): Payer: Self-pay

## 2021-02-21 ENCOUNTER — Other Ambulatory Visit (HOSPITAL_COMMUNITY): Payer: Self-pay

## 2021-02-21 ENCOUNTER — Encounter: Payer: Self-pay | Admitting: Family Medicine

## 2021-02-21 ENCOUNTER — Other Ambulatory Visit: Payer: Self-pay

## 2021-02-21 ENCOUNTER — Ambulatory Visit (INDEPENDENT_AMBULATORY_CARE_PROVIDER_SITE_OTHER): Payer: 59 | Admitting: Family Medicine

## 2021-02-21 VITALS — BP 122/90 | HR 90 | Temp 97.9°F | Ht 67.0 in | Wt 292.0 lb

## 2021-02-21 DIAGNOSIS — E7800 Pure hypercholesterolemia, unspecified: Secondary | ICD-10-CM | POA: Diagnosis not present

## 2021-02-21 DIAGNOSIS — R739 Hyperglycemia, unspecified: Secondary | ICD-10-CM

## 2021-02-21 DIAGNOSIS — F3289 Other specified depressive episodes: Secondary | ICD-10-CM | POA: Diagnosis not present

## 2021-02-21 DIAGNOSIS — Z0001 Encounter for general adult medical examination with abnormal findings: Secondary | ICD-10-CM

## 2021-02-21 DIAGNOSIS — K589 Irritable bowel syndrome without diarrhea: Secondary | ICD-10-CM | POA: Diagnosis not present

## 2021-02-21 DIAGNOSIS — L669 Cicatricial alopecia, unspecified: Secondary | ICD-10-CM | POA: Diagnosis not present

## 2021-02-21 DIAGNOSIS — I10 Essential (primary) hypertension: Secondary | ICD-10-CM | POA: Diagnosis not present

## 2021-02-21 DIAGNOSIS — L209 Atopic dermatitis, unspecified: Secondary | ICD-10-CM | POA: Diagnosis not present

## 2021-02-21 LAB — COMPREHENSIVE METABOLIC PANEL
ALT: 8 U/L (ref 0–35)
AST: 15 U/L (ref 0–37)
Albumin: 3.8 g/dL (ref 3.5–5.2)
Alkaline Phosphatase: 81 U/L (ref 39–117)
BUN: 9 mg/dL (ref 6–23)
CO2: 27 mEq/L (ref 19–32)
Calcium: 9 mg/dL (ref 8.4–10.5)
Chloride: 105 mEq/L (ref 96–112)
Creatinine, Ser: 0.73 mg/dL (ref 0.40–1.20)
GFR: 108.99 mL/min (ref >60.00)
Glucose, Bld: 71 mg/dL (ref 70–99)
Potassium: 3.6 mEq/L (ref 3.5–5.1)
Sodium: 139 mEq/L (ref 135–145)
Total Bilirubin: 0.2 mg/dL (ref 0.2–1.2)
Total Protein: 7.5 g/dL (ref 6.0–8.3)

## 2021-02-21 LAB — CBC
HCT: 38.4 % (ref 36.0–46.0)
Hemoglobin: 12.6 g/dL (ref 12.0–15.0)
MCHC: 32.7 g/dL (ref 30.0–36.0)
MCV: 84.1 fl (ref 78.0–100.0)
Platelets: 214 10*3/uL (ref 150.0–400.0)
RBC: 4.57 Mil/uL (ref 3.87–5.11)
RDW: 15.2 % (ref 11.5–15.5)
WBC: 9.6 10*3/uL (ref 4.0–10.5)

## 2021-02-21 LAB — TSH: TSH: 0.81 u[IU]/mL (ref 0.35–5.50)

## 2021-02-21 LAB — LIPID PANEL
Cholesterol: 166 mg/dL (ref 0–200)
HDL: 47.7 mg/dL (ref >39.00)
LDL Cholesterol: 100 mg/dL — ABNORMAL HIGH (ref 0–99)
NonHDL: 118.06
Total CHOL/HDL Ratio: 3
Triglycerides: 91 mg/dL (ref 0.0–149.0)
VLDL: 18.2 mg/dL (ref 0.0–40.0)

## 2021-02-21 LAB — HEMOGLOBIN A1C: Hgb A1c MFr Bld: 5.9 % (ref 4.6–6.5)

## 2021-02-21 MED ORDER — CLOBETASOL PROPIONATE 0.05 % EX OINT
TOPICAL_OINTMENT | CUTANEOUS | 3 refills | Status: DC
  Filled 2021-02-21: qty 60, 30d supply, fill #0
  Filled 2021-05-03 – 2021-05-19 (×2): qty 60, 30d supply, fill #1

## 2021-02-21 MED ORDER — TRULICITY 1.5 MG/0.5ML ~~LOC~~ SOAJ
1.5000 mg | SUBCUTANEOUS | 3 refills | Status: DC
  Filled 2021-02-21: qty 2, 28d supply, fill #0
  Filled 2021-03-18: qty 2, 28d supply, fill #1
  Filled 2021-04-19: qty 2, 28d supply, fill #2
  Filled 2021-05-19: qty 2, 28d supply, fill #3

## 2021-02-21 MED ORDER — TRIAMCINOLONE ACETONIDE 0.1 % EX OINT
TOPICAL_OINTMENT | CUTANEOUS | 3 refills | Status: DC
  Filled 2021-02-21: qty 453.6, 30d supply, fill #0

## 2021-02-21 MED ORDER — HYDROCORTISONE 2.5 % EX CREA
TOPICAL_CREAM | CUTANEOUS | 2 refills | Status: DC
  Filled 2021-02-21: qty 454, 30d supply, fill #0

## 2021-02-21 MED ORDER — BUPROPION HCL ER (XL) 150 MG PO TB24
150.0000 mg | ORAL_TABLET | Freq: Every day | ORAL | 5 refills | Status: DC
  Filled 2021-02-21: qty 30, 30d supply, fill #0
  Filled 2021-03-18: qty 30, 30d supply, fill #1
  Filled 2021-04-19: qty 30, 30d supply, fill #2
  Filled 2021-05-26: qty 30, 30d supply, fill #3

## 2021-02-21 MED ORDER — TACROLIMUS 0.1 % EX OINT
TOPICAL_OINTMENT | CUTANEOUS | 3 refills | Status: DC
  Filled 2021-02-21: qty 60, 30d supply, fill #0
  Filled 2021-05-03 – 2021-05-19 (×2): qty 60, 30d supply, fill #1

## 2021-02-21 NOTE — Assessment & Plan Note (Signed)
Check labs 

## 2021-02-21 NOTE — Patient Instructions (Signed)
It was very nice to see you today!  Please work on the exercises for your back.  Let me know if not improving.  We will try Wellbutrin.  Send a message in a few weeks let me know how this is doing.  Please increase your Trulicity to 1.5 mg weekly.  Please come back to see Madison Robertson in a few months to follow-up on this.  Take care, Dr Jerline Pain  PLEASE NOTE:  If you had any lab tests please let us know if you have not heard back within a few days. You may see your results on mychart before we have a chance to review them but we will give you a call once they are reviewed by [place]. If we ordered any referrals today, please let us know if you have not heard from their office within the next week.   Please try these tips to maintain a healthy lifestyle:  Eat at least 3 REAL meals and 1-2 snacks per day.  Aim for no more than 5 hours between eating.  If you eat breakfast, please do so within one hour of getting up.   Each meal should contain half fruits/vegetables, one quarter protein, and one quarter carbs (no bigger than a computer mouse)  Cut down on sweet beverages. This includes juice, soda, and sweet tea.   Drink at least 1 glass of water with each meal and aim for at least 8 glasses per day  Exercise at least 150 minutes every week.    Preventive Care 90-75 Years Old, Female Preventive care refers to lifestyle choices and visits with your health care provider that can promote health and wellness. This includes: A yearly physical exam. This is also called an annual wellness visit. Regular dental and eye exams. Immunizations. Screening for certain conditions. Healthy lifestyle choices, such as: Eating a healthy diet. Getting regular exercise. Not using drugs or products that contain nicotine and tobacco. Limiting alcohol use. What can I expect for my preventive care visit? Physical exam Your health care provider may check your: Height and weight. These may be used to calculate your  BMI (body mass index). BMI is a measurement that tells if you are at a healthy weight. Heart rate and blood pressure. Body temperature. Skin for abnormal spots. Counseling Your health care provider may ask you questions about your: Past medical problems. Family's medical history. Alcohol, tobacco, and drug use. Emotional well-being. Home life and relationship well-being. Sexual activity. Diet, exercise, and sleep habits. Work and work Statistician. Access to firearms. Method of birth control. Menstrual cycle. Pregnancy history. What immunizations do I need?  Vaccines are usually given at various ages, according to a schedule. Your health care provider will recommend vaccines for you based on your age, medicalhistory, and lifestyle or other factors, such as travel or where you work. What tests do I need?  Blood tests Lipid and cholesterol levels. These may be checked every 5 years starting at age 17. Hepatitis C test. Hepatitis B test. Screening Diabetes screening. This is done by checking your blood sugar (glucose) after you have not eaten for a while (fasting). STD (sexually transmitted disease) testing, if you are at risk. BRCA-related cancer screening. This may be done if you have a family history of breast, ovarian, tubal, or peritoneal cancers. Pelvic exam and Pap test. This may be done every 3 years starting at age 59. Starting at age 63, this may be done every 5 years if you have a Pap test in combination  with an HPV test. Talk with your health care provider about your test results, treatment options,and if necessary, the need for more tests. Follow these instructions at home: Eating and drinking  Eat a healthy diet that includes fresh fruits and vegetables, whole grains, lean protein, and low-fat dairy products. Take vitamin and mineral supplements as recommended by your health care provider. Do not drink alcohol if: Your health care provider tells you not to drink. You  are pregnant, may be pregnant, or are planning to become pregnant. If you drink alcohol: Limit how much you have to 0-1 drink a day. Be aware of how much alcohol is in your drink. In the [place] one drink equals one 12 oz bottle of beer (355 mL), one 5 oz glass of wine (148 mL), or one 1 oz glass of hard liquor (44 mL).  Lifestyle Take daily care of your teeth and gums. Brush your teeth every morning and night with fluoride toothpaste. Floss one time each day. Stay active. Exercise for at least 30 minutes 5 or more days each week. Do not use any products that contain nicotine or tobacco, such as cigarettes, e-cigarettes, and chewing tobacco. If you need help quitting, ask your health care provider. Do not use drugs. If you are sexually active, practice safe sex. Use a condom or other form of protection to prevent STIs (sexually transmitted infections). If you do not wish to become pregnant, use a form of birth control. If you plan to become pregnant, see your health care provider for a prepregnancy visit. Find healthy ways to cope with stress, such as: Meditation, yoga, or listening to music. Journaling. Talking to a trusted person. Spending time with friends and family. Safety Always wear your seat belt while driving or riding in a vehicle. Do not drive: If you have been drinking alcohol. Do not ride with someone who has been drinking. When you are tired or distracted. While texting. Wear a helmet and other protective equipment during sports activities. If you have firearms in your house, make sure you follow all gun safety procedures. Seek help if you have been physically or sexually abused. What's next? Go to your health care provider once a year for an annual wellness visit. Ask your health care provider how often you should have your eyes and teeth checked. Stay up to date on all vaccines. This information is not intended to replace advice given to you by your health care provider.  Make sure you discuss any questions you have with your healthcare provider. Document Revised: 03/12/2020 Document Reviewed: 03/26/2018 Elsevier Patient Education  2022 [employer].

## 2021-02-21 NOTE — Assessment & Plan Note (Signed)
At goal today on losartan 50 mg daily and metoprolol succinate 25 mg daily.

## 2021-02-21 NOTE — Assessment & Plan Note (Signed)
Not controlled.  Discussed options.  We will start Wellbutrin 150 mg daily.  Also place referral to therapy.  She will check in with [place] in a couple weeks via [place].  She has tried Prozac and Zoloft in the past without significant improvement.

## 2021-02-21 NOTE — Assessment & Plan Note (Signed)
Doing okay with Trulicity.  Will increase dose to 1.5 mg weekly.  Follow-up with PCP in few months.
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
## 2021-02-21 NOTE — Assessment & Plan Note (Signed)
Follows with GI

## 2021-02-22 ENCOUNTER — Other Ambulatory Visit (HOSPITAL_COMMUNITY): Payer: Self-pay

## 2021-02-22 NOTE — Progress Notes (Signed)
Please inform patient of the following:  Cholesterol blood sugar both borderline everything else is stable.  Her numbers should improve as she continues to lose weight.  We can recheck again in about a year.

## 2021-02-23 ENCOUNTER — Other Ambulatory Visit (HOSPITAL_COMMUNITY): Payer: Self-pay

## 2021-02-28 ENCOUNTER — Other Ambulatory Visit (HOSPITAL_COMMUNITY): Payer: Self-pay

## 2021-02-28 MED ORDER — METHYLPHENIDATE 30 MG/9HR TD PTCH
MEDICATED_PATCH | TRANSDERMAL | 0 refills | Status: DC
  Filled 2021-02-28: qty 30, 30d supply, fill #0

## 2021-03-01 ENCOUNTER — Other Ambulatory Visit (HOSPITAL_COMMUNITY): Payer: Self-pay

## 2021-03-18 ENCOUNTER — Other Ambulatory Visit: Payer: Self-pay | Admitting: Physician Assistant

## 2021-03-18 MED FILL — Metoprolol Succinate Tab ER 24HR 25 MG (Tartrate Equiv): ORAL | 90 days supply | Qty: 90 | Fill #1 | Status: AC

## 2021-03-19 ENCOUNTER — Other Ambulatory Visit (HOSPITAL_COMMUNITY): Payer: Self-pay

## 2021-03-19 ENCOUNTER — Other Ambulatory Visit: Payer: Self-pay | Admitting: Physician Assistant

## 2021-03-27 ENCOUNTER — Other Ambulatory Visit: Payer: Self-pay | Admitting: Physician Assistant

## 2021-03-28 ENCOUNTER — Other Ambulatory Visit (HOSPITAL_COMMUNITY): Payer: Self-pay

## 2021-03-28 MED ORDER — METHYLPHENIDATE 30 MG/9HR TD PTCH
MEDICATED_PATCH | TRANSDERMAL | 0 refills | Status: DC
  Filled 2021-03-28: qty 30, 30d supply, fill #0

## 2021-03-29 ENCOUNTER — Other Ambulatory Visit (HOSPITAL_COMMUNITY): Payer: Self-pay

## 2021-03-29 MED ORDER — LOSARTAN POTASSIUM 50 MG PO TABS
ORAL_TABLET | Freq: Every day | ORAL | 0 refills | Status: DC
  Filled 2021-03-29: qty 90, 90d supply, fill #0

## 2021-04-04 ENCOUNTER — Other Ambulatory Visit (HOSPITAL_COMMUNITY): Payer: Self-pay

## 2021-04-04 ENCOUNTER — Telehealth: Payer: 59 | Admitting: Physician Assistant

## 2021-04-04 DIAGNOSIS — B9789 Other viral agents as the cause of diseases classified elsewhere: Secondary | ICD-10-CM

## 2021-04-04 DIAGNOSIS — Z79899 Other long term (current) drug therapy: Secondary | ICD-10-CM | POA: Diagnosis not present

## 2021-04-04 DIAGNOSIS — J019 Acute sinusitis, unspecified: Secondary | ICD-10-CM | POA: Diagnosis not present

## 2021-04-04 DIAGNOSIS — F419 Anxiety disorder, unspecified: Secondary | ICD-10-CM | POA: Diagnosis not present

## 2021-04-04 DIAGNOSIS — F900 Attention-deficit hyperactivity disorder, predominantly inattentive type: Secondary | ICD-10-CM | POA: Diagnosis not present

## 2021-04-04 MED ORDER — AZELASTINE HCL 0.1 % NA SOLN
1.0000 | Freq: Two times a day (BID) | NASAL | 0 refills | Status: DC
  Filled 2021-04-04: qty 30, 30d supply, fill #0
  Filled 2021-09-04: qty 30, 50d supply, fill #0

## 2021-04-04 NOTE — Progress Notes (Signed)
E-Visit for Sinus Problems  We are sorry that you are not feeling well.  Here is how we plan to help!  Based on what you have shared with me it looks like you have sinusitis.  Sinusitis is inflammation and infection in the sinus cavities of the head.  Based on your presentation I believe you most likely have Acute Viral Sinusitis.This is an infection most likely caused by a virus. There is not specific treatment for viral sinusitis other than to help you with the symptoms until the infection runs its course.  You may use an oral decongestant such as Mucinex D or if you have glaucoma or high blood pressure use plain Mucinex. Saline nasal spray help and can safely be used as often as needed for congestion, I have prescribed: Azelastine nasal spray 2 sprays in each nostril twice a day. You can use this along with restarting your Flonase.   Some authorities believe that zinc sprays or the use of Echinacea may shorten the course of your symptoms.  Sinus infections are not as easily transmitted as other respiratory infection, however we still recommend that you avoid close contact with loved ones, especially the very young and elderly.  Remember to wash your hands thoroughly throughout the day as this is the number one way to prevent the spread of infection!  Home Care: Only take medications as instructed by your medical team. Do not take these medications with alcohol. A steam or ultrasonic humidifier can help congestion.  You can place a towel over your head and breathe in the steam from hot water coming from a faucet. Avoid close contacts especially the very young and the elderly. Cover your mouth when you cough or sneeze. Always remember to wash your hands.  Get Help Right Away If: You develop worsening fever or sinus pain. You develop a severe head ache or visual changes. Your symptoms persist after you have completed your treatment plan.  Make sure you Understand these instructions. Will  watch your condition. Will get help right away if you are not doing well or get worse.   Thank you for choosing an e-visit.  Your e-visit answers were reviewed by a board certified advanced clinical practitioner to complete your personal care plan. Depending upon the condition, your plan could have included both over the counter or prescription medications.  Please review your pharmacy choice. Make sure the pharmacy is open so you can pick up prescription now. If there is a problem, you may contact your provider through [employer] and have the prescription routed to another pharmacy.  Your safety is important to [place]. If you have drug allergies check your prescription carefully.   For the next 24 hours you can use MyChart to ask questions about today's visit, request a non-urgent call back, or ask for a work or school excuse. You will get an email in the next two days asking about your experience. I hope that your e-visit has been valuable and will speed your recovery.

## 2021-04-04 NOTE — Progress Notes (Signed)
I have spent 5 minutes in review of e-visit questionnaire, review and updating patient chart, medical decision making and response to patient.   Jamia Hoban Cody Adrionna Delcid, PA-C    

## 2021-04-05 ENCOUNTER — Other Ambulatory Visit (HOSPITAL_COMMUNITY): Payer: Self-pay

## 2021-04-05 MED ORDER — METHYLPHENIDATE 30 MG/9HR TD PTCH
MEDICATED_PATCH | TRANSDERMAL | 0 refills | Status: DC
  Filled 2021-04-05 – 2021-06-01 (×2): qty 30, 30d supply, fill #0

## 2021-04-12 ENCOUNTER — Other Ambulatory Visit (HOSPITAL_COMMUNITY): Payer: Self-pay

## 2021-04-20 ENCOUNTER — Other Ambulatory Visit (HOSPITAL_COMMUNITY): Payer: Self-pay

## 2021-04-23 ENCOUNTER — Other Ambulatory Visit (HOSPITAL_COMMUNITY): Payer: Self-pay

## 2021-04-30 ENCOUNTER — Other Ambulatory Visit (HOSPITAL_COMMUNITY): Payer: Self-pay

## 2021-04-30 MED ORDER — METHYLPHENIDATE 30 MG/9HR TD PTCH
MEDICATED_PATCH | TRANSDERMAL | 0 refills | Status: DC
  Filled 2021-04-30: qty 30, 30d supply, fill #0

## 2021-05-01 ENCOUNTER — Other Ambulatory Visit (HOSPITAL_COMMUNITY): Payer: Self-pay

## 2021-05-04 ENCOUNTER — Other Ambulatory Visit (HOSPITAL_COMMUNITY): Payer: Self-pay

## 2021-05-07 ENCOUNTER — Other Ambulatory Visit (HOSPITAL_COMMUNITY): Payer: Self-pay

## 2021-05-15 ENCOUNTER — Other Ambulatory Visit (HOSPITAL_COMMUNITY): Payer: Self-pay

## 2021-05-19 ENCOUNTER — Encounter: Payer: Self-pay | Admitting: Physician Assistant

## 2021-05-19 ENCOUNTER — Other Ambulatory Visit (HOSPITAL_COMMUNITY): Payer: Self-pay

## 2021-05-26 ENCOUNTER — Other Ambulatory Visit (HOSPITAL_COMMUNITY): Payer: Self-pay

## 2021-05-28 ENCOUNTER — Telehealth (INDEPENDENT_AMBULATORY_CARE_PROVIDER_SITE_OTHER): Payer: 59 | Admitting: Physician Assistant

## 2021-05-28 ENCOUNTER — Encounter: Payer: Self-pay | Admitting: Physician Assistant

## 2021-05-28 ENCOUNTER — Other Ambulatory Visit (HOSPITAL_COMMUNITY): Payer: Self-pay

## 2021-05-28 VITALS — Ht 67.0 in | Wt 292.0 lb

## 2021-05-28 DIAGNOSIS — L669 Cicatricial alopecia, unspecified: Secondary | ICD-10-CM | POA: Insufficient documentation

## 2021-05-28 DIAGNOSIS — F3289 Other specified depressive episodes: Secondary | ICD-10-CM | POA: Diagnosis not present

## 2021-05-28 DIAGNOSIS — L2084 Intrinsic (allergic) eczema: Secondary | ICD-10-CM

## 2021-05-28 DIAGNOSIS — L6681 Central centrifugal cicatricial alopecia: Secondary | ICD-10-CM

## 2021-05-28 DIAGNOSIS — L304 Erythema intertrigo: Secondary | ICD-10-CM | POA: Diagnosis not present

## 2021-05-28 DIAGNOSIS — R61 Generalized hyperhidrosis: Secondary | ICD-10-CM | POA: Diagnosis not present

## 2021-05-28 HISTORY — DX: Central centrifugal cicatricial alopecia: L66.81

## 2021-05-28 HISTORY — DX: Intrinsic (allergic) eczema: L20.84

## 2021-05-28 MED ORDER — NYSTATIN 100000 UNIT/GM EX POWD
CUTANEOUS | 5 refills | Status: DC
  Filled 2021-05-28: qty 60, 30d supply, fill #0
  Filled 2021-10-24: qty 60, 30d supply, fill #1

## 2021-05-28 MED ORDER — DRYSOL 20 % EX SOLN
CUTANEOUS | 5 refills | Status: DC
  Filled 2021-05-28: qty 60, 30d supply, fill #0

## 2021-05-28 MED ORDER — BUPROPION HCL ER (XL) 300 MG PO TB24
300.0000 mg | ORAL_TABLET | Freq: Every day | ORAL | 1 refills | Status: DC
  Filled 2021-05-28: qty 90, 90d supply, fill #0
  Filled 2021-09-04: qty 90, 90d supply, fill #1

## 2021-05-28 NOTE — Progress Notes (Signed)
Virtual Visit via Video   I connected with Madison Robertson on 05/28/21 at  3:30 PM EDT by a video enabled telemedicine application and verified that I am speaking with the correct person using two identifiers. Location patient: Home Location provider: [facility], Office Persons participating in the virtual visit: Tashauna M Dykstra, Jatavis Malek PA-C, Anselmo Pickler, LPN   I discussed the limitations of evaluation and management by telemedicine and the availability of in person appointments. The patient expressed understanding and agreed to proceed.  I acted as a Education administrator for [employer], [employer], LPN   Subjective:   HPI:   Depression Currently taking Wellbutrin 150 mg daily. Pt says medication was working well for the first month or two but now feels like her body is "used to it." Denies SI/HI. Interested in an increase in dosage.  Obesity She is planning to pursue bariatric surgery and requires a letter of necessity from me today. Hx of past efforts include: Healthy Weight and Well - 2020 Weight Watchers - 2019, 2020, 2021 Nutrisystem - 2016 [place]om - 2021   ROS: See pertinent positives and negatives per HPI.  Patient Active Problem List   Diagnosis Date Noted   Nausea and vomiting 04/14/2020   Generalized abdominal pain 04/14/2020   Abnormal CT scan, small bowel 04/14/2020   Pure hypercholesterolemia 02/07/2020   Morbid obesity ([place]) 02/07/2020   Palpitations 02/07/2020   ADD (attention deficit disorder) 06/29/2018   Anxiety 06/29/2018   Depression 06/29/2018   Essential hypertension 06/29/2018   IBS (irritable bowel syndrome) 06/29/2018   PCOS (polycystic ovarian syndrome) 06/29/2018    Social History   Tobacco Use   Smoking status: Never   Smokeless tobacco: Never  Substance Use Topics   Alcohol use: Yes    Comment: occassionally    Current Outpatient Medications:    aluminum chloride (DRYSOL) 20 % external solution, Apply to underarms  daily to every other day under occlusion as tolerated, Disp: 60 mL, Rfl: 5   azelastine (ASTELIN) 0.1 % nasal spray, Place 1 spray into both nostrils 2 (two) times daily. Use in each nostril as directed, Disp: 30 mL, Rfl: 0   buPROPion (WELLBUTRIN XL) 300 MG 24 hr tablet, Take 1 tablet (300 mg total) by mouth daily., Disp: 90 tablet, Rfl: 1   clobetasol ointment (TEMOVATE) 0.05 %, Apply to the thick itchy areas twice daily. NEVER apply to face or groin. Use twice daily for two weeks, then once daily, then every other day, then switch to triamcinolone., Disp: 60 g, Rfl: 3   dicyclomine (BENTYL) 20 MG tablet, TAKE 1 TABLET BY MOUTH 3 TIMES DAILY AS NEEDED FOR SPASMS, Disp: 30 tablet, Rfl: 1   Dulaglutide (TRULICITY) 1.5 FI/4.3PI SOPN, Inject 1.5 mg into the skin once a week., Disp: 2 mL, Rfl: 3   fluticasone (FLONASE) 50 MCG/ACT nasal spray, PLACE 2 SPRAYS INTO BOTH NOSTRILS DAILY., Disp: 16 g, Rfl: 0   furosemide (LASIX) 20 MG tablet, TAKE 1 TABLET (20 MG TOTAL) BY MOUTH DAILY AS NEEDED FOR FLUID., Disp: 30 tablet, Rfl: 1   hydrocortisone 2.5 % cream, Apply to the face twice daily., Disp: 453.6 g, Rfl: 2   ibuprofen (ADVIL,MOTRIN) 200 MG tablet, Take 400 mg by mouth every 6 (six) hours as needed for fever, headache, moderate pain or cramping. , Disp: , Rfl:    linaclotide (LINZESS) 145 MCG CAPS capsule, TAKE 1 CAPSULE BY MOUTH ONCE A DAY, Disp: 30 capsule, Rfl: 3  losartan (COZAAR) 50 MG tablet, TAKE 1 TABLET BY MOUTH ONCE A DAY, Disp: 90 tablet, Rfl: 0   methylphenidate (DAYTRANA) 30 MG/9HR, Apply 1 patch onto the skin daily, Disp: 30 patch, Rfl: 0   methylphenidate (DAYTRANA) 30 MG/9HR, Apply 1 patch on the skin daily, Disp: 30 patch, Rfl: 0   metoprolol succinate (TOPROL-XL) 25 MG 24 hr tablet, TAKE 1 TABLET BY MOUTH DAILY, Disp: 90 tablet, Rfl: 3   norgestimate-ethinyl estradiol (VYLIBRA) 0.25-35 MG-MCG tablet, TAKE 1 TABLET BY MOUTH DAILY., Disp: 28 tablet, Rfl: 5   nystatin (MYCOSTATIN/NYSTOP)  powder, Apply under the breasts daily x 10 days as needed for flares of intertrigo., Disp: 60 g, Rfl: 5   tacrolimus (PROTOPIC) 0.1 % ointment, Apply once daily to the affected areas., Disp: 60 g, Rfl: 3   triamcinolone ointment (KENALOG) 0.1 %, Apply to the itchy areas twice daily. NEVER to the face., Disp: 453.6 g, Rfl: 3   promethazine (PHENERGAN) 25 MG tablet, TAKE 1 TABLET BY MOUTH EVERY 8 HOURS AS NEEDED, Disp: 30 tablet, Rfl: 1  Current Facility-Administered Medications:    0.9 %  sodium chloride infusion, 500 mL, Intravenous, Continuous, Danis, Estill Cotta III, MD  Allergies  Allergen Reactions   Amlodipine     edema   Elderberry     Throat swelling   Hydrocodone Nausea And Vomiting   Lisinopril Cough    Objective:   VITALS: Per patient if applicable, see vitals. GENERAL: Alert, appears well and in no acute distress. HEENT: Atraumatic, conjunctiva clear, no obvious abnormalities on inspection of external nose and ears. NECK: Normal movements of the head and neck. CARDIOPULMONARY: No increased WOB. Speaking in clear sentences. I:E ratio WNL.  MS: Moves all visible extremities without noticeable abnormality. PSYCH: Pleasant and cooperative, well-groomed. Speech normal rate and rhythm. Affect is appropriate. Insight and judgement are appropriate. Attention is focused, linear, and appropriate.  NEURO: CN grossly intact. Oriented as arrived to appointment on time with no prompting. Moves both UE equally.  SKIN: No obvious lesions, wounds, erythema, or cyanosis noted on face or hands.  Assessment and Plan:   Madison Robertson was seen today for depression and discuss bariatric surgery.  Diagnoses and all orders for this visit:  Other depression Uncontrolled Increase wellbutrin xl to 300 mg daily Follow-up in 3 months, sooner if concerns. I discussed with patient that if they develop any SI, to tell someone immediately and seek medical attention.   Morbid obesity ([place]) Letter provided  today for patient She will reach out to [place] if she needs anything else from our office   Other orders -     buPROPion (WELLBUTRIN XL) 300 MG 24 hr tablet; Take 1 tablet (300 mg total) by mouth daily.   I discussed the assessment and treatment plan with the patient. The patient was provided an opportunity to ask questions and all were answered. The patient agreed with the plan and demonstrated an understanding of the instructions.   The patient was advised to call back or seek an in-person evaluation if the symptoms worsen or if the condition fails to improve as anticipated.   CMA or LPN served as scribe during this visit. History, Physical, and Plan performed by medical provider. The above documentation has been reviewed and is accurate and complete.   [place], [place] 05/28/2021

## 2021-05-29 DIAGNOSIS — R61 Generalized hyperhidrosis: Secondary | ICD-10-CM

## 2021-05-29 DIAGNOSIS — L304 Erythema intertrigo: Secondary | ICD-10-CM

## 2021-05-29 HISTORY — DX: Generalized hyperhidrosis: R61

## 2021-05-29 HISTORY — DX: Erythema intertrigo: L30.4

## 2021-06-01 ENCOUNTER — Other Ambulatory Visit (HOSPITAL_COMMUNITY): Payer: Self-pay

## 2021-06-04 ENCOUNTER — Other Ambulatory Visit (HOSPITAL_COMMUNITY): Payer: Self-pay

## 2021-06-06 ENCOUNTER — Other Ambulatory Visit (HOSPITAL_COMMUNITY): Payer: Self-pay

## 2021-06-06 MED ORDER — DUPIXENT 300 MG/2ML ~~LOC~~ SOAJ: SUBCUTANEOUS | 11 refills | Status: DC

## 2021-06-06 MED ORDER — DUPIXENT 300 MG/2ML ~~LOC~~ SOAJ
SUBCUTANEOUS | 0 refills | Status: DC
  Filled 2021-06-07 (×2): qty 4, 14d supply, fill #0

## 2021-06-07 ENCOUNTER — Other Ambulatory Visit (HOSPITAL_COMMUNITY): Payer: Self-pay

## 2021-06-12 ENCOUNTER — Other Ambulatory Visit: Payer: Self-pay

## 2021-06-12 ENCOUNTER — Ambulatory Visit: Payer: 59 | Attending: Family Medicine | Admitting: Pharmacist

## 2021-06-12 ENCOUNTER — Other Ambulatory Visit (HOSPITAL_COMMUNITY): Payer: Self-pay

## 2021-06-12 DIAGNOSIS — Z79899 Other long term (current) drug therapy: Secondary | ICD-10-CM

## 2021-06-12 MED ORDER — DUPIXENT 300 MG/2ML ~~LOC~~ SOAJ
SUBCUTANEOUS | 11 refills | Status: DC
  Filled 2021-06-12: qty 4, fill #0
  Filled 2021-07-04: qty 4, 28d supply, fill #0
  Filled 2021-08-06 (×2): qty 4, 28d supply, fill #1
  Filled 2021-09-04 – 2021-09-05 (×2): qty 4, 28d supply, fill #2
  Filled 2021-09-26 – 2021-10-01 (×2): qty 4, 28d supply, fill #3
  Filled 2021-11-01: qty 4, 28d supply, fill #4
  Filled 2021-11-19 – 2021-11-23 (×2): qty 4, 28d supply, fill #5
  Filled 2021-12-17: qty 4, 28d supply, fill #6
  Filled 2022-01-14: qty 4, 28d supply, fill #7
  Filled 2022-02-13: qty 4, 28d supply, fill #8
  Filled 2022-03-18: qty 4, 28d supply, fill #9
  Filled 2022-04-11: qty 4, 28d supply, fill #10
  Filled 2022-05-14: qty 4, 28d supply, fill #11

## 2021-06-12 MED ORDER — DUPIXENT 300 MG/2ML ~~LOC~~ SOAJ
SUBCUTANEOUS | 0 refills | Status: DC
  Filled 2021-06-12: qty 4, 14d supply, fill #0

## 2021-06-12 NOTE — Progress Notes (Signed)
   S: Patient presents for review of their specialty medication therapy.  Patient is currently taking Dupixent for atopic dermatitis. Patient is managed by Dr. Lois Huxley for this.   Adherence: has not yet started   Efficacy:  has not yet started   Dosing: 600 mg subq x1 dose followed by 300 mg q14days  Dose adjustments: Renal: no dose adjustments (has not been studied) Hepatic: no dose adjustments (has not been studied)  Drug-drug interactions: none identified   Screening: TB test: completed   Monitoring: S/sx of infection: none  S/sx of hypersensitivity: none S/sx of ocular effects: none S/sx of eosinophilia/vasculitis: none  O:     Lab Results  Component Value Date   WBC 9.6 02/21/2021   HGB 12.6 02/21/2021   HCT 38.4 02/21/2021   MCV 84.1 02/21/2021   PLT 214.0 02/21/2021      Chemistry      Component Value Date/Time   NA 139 02/21/2021 1114   NA 142 02/07/2020 1112   K 3.6 02/21/2021 1114   CL 105 02/21/2021 1114   CO2 27 02/21/2021 1114   BUN 9 02/21/2021 1114   BUN 5 (L) 02/07/2020 1112   CREATININE 0.73 02/21/2021 1114      Component Value Date/Time   CALCIUM 9.0 02/21/2021 1114   ALKPHOS 81 02/21/2021 1114   AST 15 02/21/2021 1114   ALT 8 02/21/2021 1114   BILITOT 0.2 02/21/2021 1114       A/P: 1. Medication review: Patient currently on [place] for atopic dermatitis. Reviewed the medication with the patient, including the following: Dupixent is a monoclonal antibody used for the treatment of asthma or atopic dermatitis. Patient educated on purpose, proper use and potential adverse effects of Dupixent. Possible adverse effects include increased risk of infection, ocular effects, vasculitis/eosinophilia, and hypersensitivity reactions. Administer as a SubQ injection and rotate sites. Allow the medication to reach room temp prior to administration (45 mins for 300 mg syringe or 30 min for 200 mg syringe). Do not shake. Discard any unused  portion. No recommendations for any changes.    Benard Halsted, PharmD, [place], [place] [phone]

## 2021-06-14 ENCOUNTER — Other Ambulatory Visit: Payer: Self-pay | Admitting: Family Medicine

## 2021-06-15 ENCOUNTER — Other Ambulatory Visit (HOSPITAL_COMMUNITY): Payer: Self-pay

## 2021-06-15 ENCOUNTER — Other Ambulatory Visit: Payer: Self-pay | Admitting: Family Medicine

## 2021-06-15 MED FILL — Dulaglutide Soln Auto-injector 1.5 MG/0.5ML: SUBCUTANEOUS | 28 days supply | Qty: 2 | Fill #0 | Status: AC

## 2021-06-18 ENCOUNTER — Other Ambulatory Visit (HOSPITAL_COMMUNITY): Payer: Self-pay

## 2021-06-19 ENCOUNTER — Other Ambulatory Visit (HOSPITAL_COMMUNITY): Payer: Self-pay

## 2021-06-24 ENCOUNTER — Other Ambulatory Visit: Payer: Self-pay | Admitting: Family Medicine

## 2021-06-25 ENCOUNTER — Other Ambulatory Visit (HOSPITAL_COMMUNITY): Payer: Self-pay

## 2021-06-25 MED ORDER — NORGESTIMATE-ETH ESTRADIOL 0.25-35 MG-MCG PO TABS
1.0000 | ORAL_TABLET | Freq: Every day | ORAL | 3 refills | Status: DC
  Filled 2021-06-25: qty 84, 84d supply, fill #0
  Filled 2021-09-26: qty 84, 84d supply, fill #1
  Filled 2021-12-28: qty 84, 84d supply, fill #2
  Filled 2022-04-16: qty 84, 84d supply, fill #3

## 2021-07-02 ENCOUNTER — Other Ambulatory Visit (HOSPITAL_COMMUNITY): Payer: Self-pay

## 2021-07-02 DIAGNOSIS — F900 Attention-deficit hyperactivity disorder, predominantly inattentive type: Secondary | ICD-10-CM | POA: Diagnosis not present

## 2021-07-02 DIAGNOSIS — Z79899 Other long term (current) drug therapy: Secondary | ICD-10-CM | POA: Diagnosis not present

## 2021-07-02 MED ORDER — METHYLPHENIDATE 30 MG/9HR TD PTCH
MEDICATED_PATCH | TRANSDERMAL | 0 refills | Status: DC
  Filled 2021-07-02: qty 30, 30d supply, fill #0

## 2021-07-03 ENCOUNTER — Other Ambulatory Visit (HOSPITAL_COMMUNITY): Payer: Self-pay

## 2021-07-03 DIAGNOSIS — F902 Attention-deficit hyperactivity disorder, combined type: Secondary | ICD-10-CM | POA: Diagnosis not present

## 2021-07-04 ENCOUNTER — Other Ambulatory Visit (HOSPITAL_COMMUNITY): Payer: Self-pay

## 2021-07-06 ENCOUNTER — Other Ambulatory Visit (HOSPITAL_COMMUNITY): Payer: Self-pay

## 2021-07-06 ENCOUNTER — Other Ambulatory Visit: Payer: Self-pay | Admitting: Physician Assistant

## 2021-07-07 ENCOUNTER — Other Ambulatory Visit (HOSPITAL_COMMUNITY): Payer: Self-pay

## 2021-07-07 ENCOUNTER — Other Ambulatory Visit: Payer: Self-pay | Admitting: Physician Assistant

## 2021-07-07 ENCOUNTER — Telehealth: Payer: 59 | Admitting: Emergency Medicine

## 2021-07-07 DIAGNOSIS — J019 Acute sinusitis, unspecified: Secondary | ICD-10-CM | POA: Diagnosis not present

## 2021-07-07 MED ORDER — AZELASTINE HCL 0.1 % NA SOLN: 1.0000 | Freq: Two times a day (BID) | NASAL | 12 refills | Status: DC

## 2021-07-07 NOTE — Progress Notes (Signed)
I have spent 5 minutes in review of e-visit questionnaire, review and updating patient chart, medical decision making and response to patient.   Launa Goedken, PA-C    

## 2021-07-07 NOTE — Progress Notes (Signed)

## 2021-07-09 ENCOUNTER — Other Ambulatory Visit (HOSPITAL_COMMUNITY): Payer: Self-pay

## 2021-07-10 ENCOUNTER — Encounter: Payer: Self-pay | Admitting: Physician Assistant

## 2021-07-10 ENCOUNTER — Other Ambulatory Visit (HOSPITAL_COMMUNITY): Payer: Self-pay

## 2021-07-10 MED ORDER — LOSARTAN POTASSIUM 50 MG PO TABS
ORAL_TABLET | Freq: Every day | ORAL | 1 refills | Status: DC
  Filled 2021-07-10: qty 90, 90d supply, fill #0
  Filled 2021-10-24: qty 90, 90d supply, fill #1

## 2021-07-10 MED FILL — Dulaglutide Soln Auto-injector 1.5 MG/0.5ML: SUBCUTANEOUS | 28 days supply | Qty: 2 | Fill #1 | Status: AC

## 2021-07-11 ENCOUNTER — Other Ambulatory Visit (HOSPITAL_COMMUNITY): Payer: Self-pay

## 2021-08-01 ENCOUNTER — Other Ambulatory Visit (HOSPITAL_COMMUNITY): Payer: Self-pay

## 2021-08-01 DIAGNOSIS — K582 Mixed irritable bowel syndrome: Secondary | ICD-10-CM | POA: Diagnosis not present

## 2021-08-01 DIAGNOSIS — I10 Essential (primary) hypertension: Secondary | ICD-10-CM | POA: Diagnosis not present

## 2021-08-01 MED ORDER — METHYLPHENIDATE 30 MG/9HR TD PTCH
MEDICATED_PATCH | TRANSDERMAL | 0 refills | Status: DC
  Filled 2021-08-01: qty 30, 30d supply, fill #0

## 2021-08-02 ENCOUNTER — Other Ambulatory Visit (HOSPITAL_COMMUNITY): Payer: Self-pay

## 2021-08-03 ENCOUNTER — Other Ambulatory Visit (HOSPITAL_COMMUNITY): Payer: Self-pay

## 2021-08-06 ENCOUNTER — Other Ambulatory Visit (HOSPITAL_COMMUNITY): Payer: Self-pay

## 2021-08-07 ENCOUNTER — Other Ambulatory Visit: Payer: Self-pay | Admitting: General Surgery

## 2021-08-07 ENCOUNTER — Other Ambulatory Visit (HOSPITAL_COMMUNITY): Payer: Self-pay | Admitting: General Surgery

## 2021-08-07 ENCOUNTER — Other Ambulatory Visit (HOSPITAL_COMMUNITY): Payer: Self-pay

## 2021-08-07 DIAGNOSIS — Z01818 Encounter for other preprocedural examination: Secondary | ICD-10-CM | POA: Diagnosis not present

## 2021-08-07 DIAGNOSIS — K5900 Constipation, unspecified: Secondary | ICD-10-CM | POA: Diagnosis not present

## 2021-08-07 DIAGNOSIS — K582 Mixed irritable bowel syndrome: Secondary | ICD-10-CM | POA: Diagnosis not present

## 2021-08-07 DIAGNOSIS — I10 Essential (primary) hypertension: Secondary | ICD-10-CM | POA: Diagnosis not present

## 2021-08-07 MED ORDER — LINZESS 72 MCG PO CAPS
ORAL_CAPSULE | ORAL | 5 refills | Status: DC
  Filled 2021-08-07: qty 30, 30d supply, fill #0

## 2021-08-20 ENCOUNTER — Other Ambulatory Visit (HOSPITAL_COMMUNITY): Payer: Self-pay

## 2021-08-20 MED FILL — Dulaglutide Soln Auto-injector 1.5 MG/0.5ML: SUBCUTANEOUS | 28 days supply | Qty: 2 | Fill #2 | Status: AC

## 2021-08-29 ENCOUNTER — Other Ambulatory Visit (HOSPITAL_COMMUNITY): Payer: Self-pay

## 2021-08-30 ENCOUNTER — Other Ambulatory Visit (HOSPITAL_COMMUNITY): Payer: Self-pay

## 2021-08-30 MED ORDER — METHYLPHENIDATE 30 MG/9HR TD PTCH
MEDICATED_PATCH | TRANSDERMAL | 0 refills | Status: DC
  Filled 2021-08-30: qty 30, 30d supply, fill #0

## 2021-08-31 ENCOUNTER — Other Ambulatory Visit (HOSPITAL_COMMUNITY): Payer: Self-pay

## 2021-09-01 ENCOUNTER — Other Ambulatory Visit (HOSPITAL_COMMUNITY): Payer: Self-pay

## 2021-09-01 MED ORDER — VITAMIN D3 25 MCG (1000 UNIT) PO TABS: ORAL_TABLET | ORAL | 4 refills | Status: DC

## 2021-09-05 ENCOUNTER — Other Ambulatory Visit (HOSPITAL_COMMUNITY): Payer: Self-pay

## 2021-09-06 ENCOUNTER — Encounter: Payer: Self-pay | Admitting: Skilled Nursing Facility1

## 2021-09-06 ENCOUNTER — Other Ambulatory Visit: Payer: Self-pay

## 2021-09-06 ENCOUNTER — Encounter: Payer: 59 | Attending: General Surgery | Admitting: Skilled Nursing Facility1

## 2021-09-06 DIAGNOSIS — I10 Essential (primary) hypertension: Secondary | ICD-10-CM | POA: Diagnosis not present

## 2021-09-06 DIAGNOSIS — K582 Mixed irritable bowel syndrome: Secondary | ICD-10-CM | POA: Diagnosis not present

## 2021-09-06 DIAGNOSIS — Z6841 Body Mass Index (BMI) 40.0 and over, adult: Secondary | ICD-10-CM | POA: Insufficient documentation

## 2021-09-06 DIAGNOSIS — Z713 Dietary counseling and surveillance: Secondary | ICD-10-CM | POA: Insufficient documentation

## 2021-09-06 NOTE — Progress Notes (Signed)
Nutrition Assessment for Bariatric Surgery Medical Nutrition Therapy Appt Start Time: 7:30    End Time: 8:30  Patient was seen on 09/06/2021 for Pre-Operative Nutrition Assessment. Letter of approval faxed to [facility] Surgery bariatric surgery program coordinator on 09/06/2021.   Referral stated Supervised Weight Loss (SWL) visits needed: 0  Pt completed visits.   Pt has cleared nutrition requirements.     Planned surgery: sleeve gastrectomy  Pt expectation of surgery: to lose weight Pt expectation of dietitian: to support and guide     NUTRITION ASSESSMENT   Anthropometrics  Start weight at [facility]: 292.5 lbs (date: 09/06/2021)  Height: 67.5 in BMI: 45.14 kg/m2     Clinical  Medical hx: IBS-C/D, PCOS, depression, anxiety, HTN, hyperlipidemia  Medications: Wellbutrin, vitamin D supplement: see list  Labs: vitamin D 9, vitamin b12 379, cholesterol 201, LDL 126, BUN5, BUN/creatinine ratio 6 Notable signs/symptoms: cluster headaches  Any previous deficiencies? Yes: vitamin D, vitamin B12  Micronutrient Nutrition Focused Physical Exam: Hair: No issues observed Eyes: No issues observed Mouth: No issues observed Neck: No issues observed Nails: No issues observed Skin: No issues observed  Lifestyle & Dietary Hx  Pt arrives with her seemingly supportive mother.  Pt states she lives with her mother.  Pt states she really wants to start eating healthy.  Pt states she has been eating all meals out recently but really wants to stop that.   24-Hr Dietary Recall First Meal: eaten out or jimmy dean breakfast bowl or frozen breakfast burrito or peanut butter banana sandwich Snack:  Second Meal: chicken salad chi or chic fila or olive garden  Snack:  Third Meal: eaten out or ramen noodles  Snack:  Beverages: circle water, soda, gatorade, juice   Estimated Energy Needs Calories: 1600   NUTRITION DIAGNOSIS  Overweight/obesity ([place]-3.3) related to past poor dietary habits  and physical inactivity as evidenced by patient w/ planned sleeve gastrectomy surgery following dietary guidelines for continued weight loss.    NUTRITION INTERVENTION  Nutrition counseling (C-1) and education (E-2) to facilitate bariatric surgery goals.  Educated pt on micronutrient deficiencies post surgery and strategies to mitigate that risk Educated pt on the nutrition facts label    Pre-Op Goals Reviewed with the Patient Track food and beverage intake (pen and paper, MyFitness Pal, Baritastic app, etc.) Make healthy food choices while monitoring portion sizes Consume 3 meals per day or try to eat every 3-5 hours Avoid concentrated sugars and fried foods Keep sugar & fat in the single digits per serving on food labels Practice CHEWING your food (aim for applesauce consistency) Practice not drinking 15 minutes before, during, and 30 minutes after each meal and snack Avoid all carbonated beverages (ex: soda, sparkling beverages)  Limit caffeinated beverages (ex: coffee, tea, energy drinks) Avoid all sugar-sweetened beverages (ex: regular soda, sports drinks)  Avoid alcohol  Aim for 64-100 ounces of FLUID daily (with at least half of fluid intake being plain water)  Aim for at least 60-80 grams of PROTEIN daily Look for a liquid protein source that contains ?15 g protein and ?5 g carbohydrate (ex: shakes, drinks, shots) Make a list of non-food related activities Physical activity is an important part of a healthy lifestyle so keep it moving! The goal is to reach 150 minutes of exercise per week, including cardiovascular and weight baring activity.  *Goals that are bolded indicate the pt would like to start working towards these  Handouts Provided Include  Bariatric Surgery handouts (Nutrition Visits, Pre-Op Goals,  Protein Shakes, Vitamins & Minerals) Meal ideas cheet Nutrition facts label  Learning Style & Readiness for Change Teaching method utilized: Visual & Auditory   Demonstrated degree of understanding via: Teach Back  Readiness Level: action Barriers to learning/adherence to lifestyle change: none identified    MONITORING & EVALUATION Dietary intake, weekly physical activity, body weight, and pre-op goals reached at next nutrition visit.    Next Steps  Patient is to follow up at [facility] for Pre-Op Class >2 weeks before surgery for further nutrition education.  Pt has completed visits. No further supervised visits required/recomended

## 2021-09-27 ENCOUNTER — Encounter: Payer: Self-pay | Admitting: Physician Assistant

## 2021-09-27 ENCOUNTER — Other Ambulatory Visit (HOSPITAL_COMMUNITY): Payer: Self-pay

## 2021-09-27 NOTE — Telephone Encounter (Signed)
Madison Robertson, okay to order home sleep study  and chest x-ray for patient? ?

## 2021-10-01 ENCOUNTER — Other Ambulatory Visit (HOSPITAL_COMMUNITY): Payer: Self-pay

## 2021-10-01 DIAGNOSIS — F419 Anxiety disorder, unspecified: Secondary | ICD-10-CM | POA: Diagnosis not present

## 2021-10-01 DIAGNOSIS — F900 Attention-deficit hyperactivity disorder, predominantly inattentive type: Secondary | ICD-10-CM | POA: Diagnosis not present

## 2021-10-01 DIAGNOSIS — Z79899 Other long term (current) drug therapy: Secondary | ICD-10-CM | POA: Diagnosis not present

## 2021-10-01 DIAGNOSIS — F338 Other recurrent depressive disorders: Secondary | ICD-10-CM | POA: Diagnosis not present

## 2021-10-01 MED ORDER — METHYLPHENIDATE 30 MG/9HR TD PTCH
MEDICATED_PATCH | TRANSDERMAL | 0 refills | Status: DC
  Filled 2021-10-01: qty 30, 30d supply, fill #0

## 2021-10-02 ENCOUNTER — Other Ambulatory Visit (HOSPITAL_COMMUNITY): Payer: Self-pay

## 2021-10-06 MED FILL — Dulaglutide Soln Auto-injector 1.5 MG/0.5ML: SUBCUTANEOUS | 28 days supply | Qty: 2 | Fill #3 | Status: AC

## 2021-10-08 ENCOUNTER — Other Ambulatory Visit (HOSPITAL_COMMUNITY): Payer: Self-pay

## 2021-10-25 ENCOUNTER — Other Ambulatory Visit (HOSPITAL_COMMUNITY): Payer: Self-pay

## 2021-10-29 ENCOUNTER — Other Ambulatory Visit (HOSPITAL_COMMUNITY): Payer: Self-pay

## 2021-10-31 ENCOUNTER — Telehealth (INDEPENDENT_AMBULATORY_CARE_PROVIDER_SITE_OTHER): Payer: 59 | Admitting: Physician Assistant

## 2021-10-31 ENCOUNTER — Other Ambulatory Visit (HOSPITAL_COMMUNITY): Payer: Self-pay

## 2021-10-31 ENCOUNTER — Encounter (HOSPITAL_BASED_OUTPATIENT_CLINIC_OR_DEPARTMENT_OTHER): Payer: Self-pay | Admitting: Cardiovascular Disease

## 2021-10-31 ENCOUNTER — Ambulatory Visit (HOSPITAL_BASED_OUTPATIENT_CLINIC_OR_DEPARTMENT_OTHER): Payer: 59 | Admitting: Cardiovascular Disease

## 2021-10-31 ENCOUNTER — Encounter: Payer: Self-pay | Admitting: Physician Assistant

## 2021-10-31 VITALS — BP 136/102 | Ht 67.5 in | Wt 295.0 lb

## 2021-10-31 DIAGNOSIS — I10 Essential (primary) hypertension: Secondary | ICD-10-CM | POA: Diagnosis not present

## 2021-10-31 DIAGNOSIS — E7800 Pure hypercholesterolemia, unspecified: Secondary | ICD-10-CM

## 2021-10-31 DIAGNOSIS — R29818 Other symptoms and signs involving the nervous system: Secondary | ICD-10-CM

## 2021-10-31 DIAGNOSIS — Z01818 Encounter for other preprocedural examination: Secondary | ICD-10-CM | POA: Diagnosis not present

## 2021-10-31 DIAGNOSIS — R002 Palpitations: Secondary | ICD-10-CM

## 2021-10-31 MED ORDER — METHYLPHENIDATE 30 MG/9HR TD PTCH
MEDICATED_PATCH | TRANSDERMAL | 0 refills | Status: DC
  Filled 2021-10-31: qty 30, 30d supply, fill #0

## 2021-10-31 MED ORDER — VALSARTAN 320 MG PO TABS
320.0000 mg | ORAL_TABLET | Freq: Every day | ORAL | 3 refills | Status: DC
  Filled 2021-10-31: qty 90, 90d supply, fill #0
  Filled 2022-02-19: qty 90, 90d supply, fill #1

## 2021-10-31 NOTE — Progress Notes (Signed)
? ? ?Cardiology Office Note ? ? ?Date:  10/31/2021  ? ?ID:  Madison Robertson, DOB 21-Sep-1990, MRN [id number] ? ?PCP:  Inda Coke, PA  ?Cardiologist:   Skeet Latch, MD  ? ?No chief complaint on file. ? ?  ?History of Present Illness: ?Madison Robertson is a 31 y.o. female with hypertension, hyperlipidemia, prediabetes, asthma, anxiety and edema here for follow-up. She was initially seen 01/2020 for the evaluation of tachycardia and family history of CAD.  She saw Ms.Worley on 10/2019 and noted that her heart rate was sporadically over 120 at rest.  She received notifications from her [place] watch.  She note that her heart rate has been fast since her teens.  Her pediatrician noted that it was due to her ADHD medication.  Her heart rate came down the 90s when it was held.  She started back on the medication and her rate has been consistently in the 100s since then. She was minimally bothered by the symptoms. She had an echocardiogram 01/2020 that revealed LVEF 60 to 65% with normal diastolic function. She also reported lower extremity edema but there is no evidence of volume overload on her echo. She reported some bradycardia and dizziness after her COVID-19 vaccine. ? ?Ms. Bonebrake was first diagnosed with hypertension around age 32.  For the most part it has been controlled. She was instructed to work on diet and exercise. She also has a family history of CAD and was going to consider getting a coronary calcium score.  A few days after she got her echo she had severe abdominal pain and went to the ED.  She was thought to have Crohn's disease.   ? ?At the last appointment, she had sinus tachycardia thought to be due to her ADHD meds. Systolic function was normal on ECHO. She was started on metoprolol for her symptoms. ? ?Today, she reports she is doing well. ?She is concerned about her blood pressure. She developed a cough after starting lisinopril so was stopped and switched to 50 mg losartan by her PCP. She has  noticed her blood pressure increasing slowly since she started losartan. Adds that her diastolic has never been thus high before. She denies swelling in her ankles and legs since she started losartan. Her blood pressure is elevated at today's visit. Her heart rate is not bothering her anymore and it is normally between 90-120. ?BP Readings from Last 3 Encounters:  ?10/31/21 (!) 136/102  ?10/31/21 (!) 146/102  ?02/21/21 122/90  ? ?Last swelling was in March 2020. ?She exercises occasionally at work by walking up and down hills but is trying to increase.She is in the qualification process to get a gastric bypass surgery and thinks it will be in September.  ?She was switched to a patch for her ADHD medication and notes her heart rate has been slower since she started it .It was normally in the 130's-140's. ? ?She denies chest pain, shortness of breath, palpitations, lightheadedness, headaches, syncope, LE edema, orthopnea, PND.  ? ? ? ? ?Past Medical History:  ?Diagnosis Date  ? ADD (attention deficit disorder)   ? Allergy   ? Anxiety   ? Asthma   ? childhood asthma - pt out grew  ? CAD in native artery 04/14/2020  ? Depression   ? Edema, lower extremity   ? Heart murmur   ? Hyperlipidemia   ? Has never been on medication for this  ? Hypertension   ? Lisinopril-HCTZ  ? IBS (irritable bowel syndrome)   ?  Lactose intolerance   ? Migraines   ? well-controlled when blood pressure is controlled  ? Morbid obesity ([place]) 02/07/2020  ? Palpitations 02/07/2020  ? PCOS (polycystic ovarian syndrome)   ? 10 years; was on metformin for 2 months but didn't like the way it made her feel; has never been on spironolactone  ? Pre-diabetes   ? Hemoglobin A1c 6.0 in September 2019  ? Pure hypercholesterolemia 02/07/2020  ? ? ?Past Surgical History:  ?Procedure Laterality Date  ? WISDOM TOOTH EXTRACTION    ? Feb 2017  ? ? ? ?Current Outpatient Medications  ?Medication Sig Dispense Refill  ? aluminum chloride (DRYSOL) 20 % external solution  Apply to underarms daily to every other day under occlusion as tolerated 60 mL 5  ? azelastine (ASTELIN) 0.1 % nasal spray Place 1 spray into both nostrils 2 times daily. 30 mL 0  ? buPROPion (WELLBUTRIN XL) 300 MG 24 hr tablet Take 1 tablet (300 mg total) by mouth daily. 90 tablet 1  ? cholecalciferol (VITAMIN D) 25 MCG (1000 UNIT) tablet Take 1 tablet by mouth daily 100 tablet 4  ? clobetasol ointment (TEMOVATE) 0.05 % Apply to the thick itchy areas twice daily. NEVER apply to face or groin. Use twice daily for two weeks, then once daily, then every other day, then switch to triamcinolone. 60 g 3  ? dicyclomine (BENTYL) 20 MG tablet TAKE 1 TABLET BY MOUTH 3 TIMES DAILY AS NEEDED FOR SPASMS 30 tablet 1  ? Dulaglutide (TRULICITY) 1.5 JH/4.1DE SOPN Inject 1.5 mg into the skin once a week. 2 mL 3  ? Dupilumab (DUPIXENT) 300 MG/2ML SOPN Inject 2 mLs (300 mg total) into the skin every 14 days. 4 mL 11  ? fluticasone (FLONASE) 50 MCG/ACT nasal spray PLACE 2 SPRAYS INTO BOTH NOSTRILS DAILY. 16 g 0  ? furosemide (LASIX) 20 MG tablet TAKE 1 TABLET (20 MG TOTAL) BY MOUTH DAILY AS NEEDED FOR FLUID. 30 tablet 1  ? hydrocortisone 2.5 % cream Apply to the face twice daily. 453.6 g 2  ? ibuprofen (ADVIL,MOTRIN) 200 MG tablet Take 400 mg by mouth every 6 (six) hours as needed for fever, headache, moderate pain or cramping.     ? linaclotide (LINZESS) 72 MCG capsule Take 1 capsule by mouth daily. 30 capsule 5  ? methylphenidate (DAYTRANA) 30 MG/9HR Apply 1 patch to the skin daily. 30 patch 0  ? metoprolol succinate (TOPROL-XL) 25 MG 24 hr tablet TAKE 1 TABLET BY MOUTH DAILY 90 tablet 3  ? norgestimate-ethinyl estradiol (VYLIBRA) 0.25-35 MG-MCG tablet TAKE 1 TABLET BY MOUTH DAILY. 84 tablet 3  ? nystatin (MYCOSTATIN/NYSTOP) powder Apply under the breasts daily x 10 days as needed for flares of intertrigo. 60 g 5  ? promethazine (PHENERGAN) 25 MG tablet TAKE 1 TABLET BY MOUTH EVERY 8 HOURS AS NEEDED 30 tablet 1  ? tacrolimus  (PROTOPIC) 0.1 % ointment Apply once daily to the affected areas. 60 g 3  ? triamcinolone ointment (KENALOG) 0.1 % Apply to the itchy areas twice daily. NEVER to the face. 453.6 g 3  ? valsartan (DIOVAN) 320 MG tablet Take 1 tablet (320 mg total) by mouth daily. 90 tablet 3  ? ?Current Facility-Administered Medications  ?Medication Dose Route Frequency Provider Last Rate Last Admin  ? 0.9 %  sodium chloride infusion  500 mL Intravenous Continuous Danis, Kirke Corin, MD      ? ? ?Allergies:   Amlodipine, Elderberry, Hydrocodone, and Lisinopril  ? ? ?Social  History:  The patient  reports that she has never smoked. She has never used smokeless tobacco. She reports current alcohol use. She reports that she does not use drugs.  ? ?Family History:  The patient's family history includes Anxiety disorder in her mother; Atrial fibrillation in her father; Breast cancer in an other family member; Colon cancer in her maternal grandfather; Colon polyps in her mother; Congestive Heart Failure in her father; Depression in her mother; Diabetes in her father and mother; Esophageal cancer in her maternal grandmother and maternal uncle; Heart attack in her father; Heart disease in her father; Heart failure in her mother; High Cholesterol in her mother; Hypertension in her father, maternal grandfather, maternal grandmother, mother, paternal grandfather, and paternal grandmother; Obesity in her mother; Ovarian cancer in an other family member; Prostate cancer in her paternal grandfather; Uterine cancer in an other family member.  ? ? ?ROS:  Please see the history of present illness.   Otherwise, review of systems are positive for none.   All other systems are reviewed and negative.  ? ? ?PHYSICAL EXAM: ?VS:  BP (!) 146/102   Pulse (!) 111   Ht 5' 7.5" (1.715 m)   Wt 295 lb 12.8 oz (134.2 kg)   BMI 45.65 kg/m?  , BMI Body mass index is 45.65 kg/m?. ?GENERAL:  Well appearing ?HEENT:  Pupils equal round and reactive, fundi not  visualized, oral mucosa unremarkable ?NECK:  No jugular venous distention, waveform within normal limits, carotid upstroke brisk and symmetric, no bruits ?LUNGS:  Clear to auscultation bilaterally ?HEART:  RRR.  PMI not displaced

## 2021-10-31 NOTE — Assessment & Plan Note (Signed)
LDL was 100 on 01/2021.  Continue diet and exercise. ?

## 2021-10-31 NOTE — Assessment & Plan Note (Signed)
She remains tachycardic.  She hasn't noted any palpitations.  HR 90-120 at rest.  Better since switching her ADHD medication to a patch.  Continue metoprolol.   ?

## 2021-10-31 NOTE — Assessment & Plan Note (Signed)
She is working towards getting a gastric sleeve in September.  She is trying to increase her exercise. ?

## 2021-10-31 NOTE — Progress Notes (Signed)
? ?Virtual Visit via Video Note  ? ?Madison Robertson, connected with  Madison Robertson  ([id number], 12/04/1990) on 10/31/21 at  1:00 PM EDT by a video-enabled telemedicine application and verified that I am speaking with the correct person using two identifiers. ? ?Location: ?Patient: Home ?Provider: [place] office ?  ?I discussed the limitations of evaluation and management by telemedicine and the availability of in person appointments. The patient expressed understanding and agreed to proceed.   ? ?History of Present Illness: ?Madison Robertson is a 31 y.o. who identifies as a female who was assigned female at birth, and is being seen today for pre-op discussion. ? ?Patient is planning to have bariatric surgery this fall. She needs a home sleep test as she states that the surgeon measured her neck circumference and was concerned that she may have sleep apnea.  Patient works for [employer] and would prefer to have home sleep study done through their office. ? ?Patient also needs a chest x-ray prior to her surgery.  She is requesting this today.  Denies any recent upper respiratory illnesses, unintentional weight loss. ? ?Problems:  ?Patient Active Problem List  ? Diagnosis Date Noted  ? Nausea and vomiting 04/14/2020  ? Generalized abdominal pain 04/14/2020  ? Abnormal CT scan, small bowel 04/14/2020  ? Pure hypercholesterolemia 02/07/2020  ? Morbid obesity ([place]) 02/07/2020  ? Palpitations 02/07/2020  ? ADD (attention deficit disorder) 06/29/2018  ? Anxiety 06/29/2018  ? Depression 06/29/2018  ? Essential hypertension 06/29/2018  ? IBS (irritable bowel syndrome) 06/29/2018  ? PCOS (polycystic ovarian syndrome) 06/29/2018  ?  ?Allergies:  ?Allergies  ?Allergen Reactions  ? Amlodipine   ?  edema  ? Elderberry   ?  Throat swelling  ? Hydrocodone Nausea And Vomiting  ? Lisinopril Cough  ? ?Medications:  ?Current Outpatient Medications:  ?  aluminum chloride (DRYSOL) 20 % external solution,  Apply to underarms daily to every other day under occlusion as tolerated, Disp: 60 mL, Rfl: 5 ?  azelastine (ASTELIN) 0.1 % nasal spray, Place 1 spray into both nostrils 2 times daily., Disp: 30 mL, Rfl: 0 ?  buPROPion (WELLBUTRIN XL) 300 MG 24 hr tablet, Take 1 tablet (300 mg total) by mouth daily., Disp: 90 tablet, Rfl: 1 ?  cholecalciferol (VITAMIN D) 25 MCG (1000 UNIT) tablet, Take 1 tablet by mouth daily, Disp: 100 tablet, Rfl: 4 ?  clobetasol ointment (TEMOVATE) 0.05 %, Apply to the thick itchy areas twice daily. NEVER apply to face or groin. Use twice daily for two weeks, then once daily, then every other day, then switch to triamcinolone., Disp: 60 g, Rfl: 3 ?  dicyclomine (BENTYL) 20 MG tablet, TAKE 1 TABLET BY MOUTH 3 TIMES DAILY AS NEEDED FOR SPASMS, Disp: 30 tablet, Rfl: 1 ?  Dulaglutide (TRULICITY) 1.5 GX/2.1JH SOPN, Inject 1.5 mg into the skin once a week., Disp: 2 mL, Rfl: 3 ?  Dupilumab (DUPIXENT) 300 MG/2ML SOPN, Inject 2 mLs (300 mg total) into the skin every 14 days., Disp: 4 mL, Rfl: 11 ?  fluticasone (FLONASE) 50 MCG/ACT nasal spray, PLACE 2 SPRAYS INTO BOTH NOSTRILS DAILY., Disp: 16 g, Rfl: 0 ?  furosemide (LASIX) 20 MG tablet, TAKE 1 TABLET (20 MG TOTAL) BY MOUTH DAILY AS NEEDED FOR FLUID., Disp: 30 tablet, Rfl: 1 ?  hydrocortisone 2.5 % cream, Apply to the face twice daily., Disp: 453.6 g, Rfl: 2 ?  ibuprofen (ADVIL,MOTRIN) 200 MG tablet, Take 400 mg by mouth  every 6 (six) hours as needed for fever, headache, moderate pain or cramping. , Disp: , Rfl:  ?  linaclotide (LINZESS) 72 MCG capsule, Take 1 capsule by mouth daily., Disp: 30 capsule, Rfl: 5 ?  methylphenidate (DAYTRANA) 30 MG/9HR, Apply 1 patch to the skin daily., Disp: 30 patch, Rfl: 0 ?  metoprolol succinate (TOPROL-XL) 25 MG 24 hr tablet, TAKE 1 TABLET BY MOUTH DAILY, Disp: 90 tablet, Rfl: 3 ?  norgestimate-ethinyl estradiol (VYLIBRA) 0.25-35 MG-MCG tablet, TAKE 1 TABLET BY MOUTH DAILY., Disp: 84 tablet, Rfl: 3 ?  nystatin  (MYCOSTATIN/NYSTOP) powder, Apply under the breasts daily x 10 days as needed for flares of intertrigo., Disp: 60 g, Rfl: 5 ?  promethazine (PHENERGAN) 25 MG tablet, TAKE 1 TABLET BY MOUTH EVERY 8 HOURS AS NEEDED, Disp: 30 tablet, Rfl: 1 ?  tacrolimus (PROTOPIC) 0.1 % ointment, Apply once daily to the affected areas., Disp: 60 g, Rfl: 3 ?  triamcinolone ointment (KENALOG) 0.1 %, Apply to the itchy areas twice daily. NEVER to the face., Disp: 453.6 g, Rfl: 3 ?  valsartan (DIOVAN) 320 MG tablet, Take 1 tablet (320 mg total) by mouth daily., Disp: 90 tablet, Rfl: 3 ? ?Current Facility-Administered Medications:  ?  0.9 %  sodium chloride infusion, 500 mL, Intravenous, Continuous, Danis, Kirke Corin, MD ? ?Observations/Objective: ?Patient is well-developed, well-nourished in no acute distress.  ?Resting comfortably  at home.  ?Head is normocephalic, atraumatic.  ?No labored breathing.  ?Speech is clear and coherent with logical content.  ?Patient is alert and oriented at baseline.  ? ? ?Assessment and Plan: ?1. Suspected sleep apnea ?- Home sleep test ? ?2. Preop examination ?- DG Chest 2 View; Future ? ?Patient is overall doing well without any significant issues at this time.  We will be in touch with her regarding the results.  Any interventions needed will be discussed at that time. ? ?Follow Up Instructions: ?I discussed the assessment and treatment plan with the patient. The patient was provided an opportunity to ask questions and all were answered. The patient agreed with the plan and demonstrated an understanding of the instructions.  A copy of instructions were sent to the patient via MyChart unless otherwise noted below.  ? ?The patient was advised to call back or seek an in-person evaluation if the symptoms worsen or if the condition fails to improve as anticipated. ? ?Inda Coke, PA ?

## 2021-10-31 NOTE — Assessment & Plan Note (Signed)
She developed a cough on lisinopril.  She stopped lisinopril and switched to losartan.  BP no longer controlled.  We will stop losartan and start valsartan '320mg'$  daily.  BMP in one week.  She had hypokalemia on HCTZ.  If she needs an additional agent, will add spironolactone. ?

## 2021-10-31 NOTE — Patient Instructions (Signed)
Medication Instructions:  ?STOP LOSARTAN  ? ?START VALSARTAN 320 MG DAILY  ? ?*If you need a refill on your cardiac medications before your next appointment, please call your pharmacy* ? ?Lab Work: ?NONE ? ?Testing/Procedures: ?NONE ? ?Follow-Up: ?At [facility], you and your health needs are our priority.  As part of our continuing mission to provide you with exceptional heart care, we have created designated Provider Care Teams.  These Care Teams include your primary Cardiologist (physician) and Advanced Practice Providers (APPs -  Physician Assistants and Nurse Practitioners) who all work together to provide you with the care you need, when you need it. ? ?We recommend signing up for the patient portal called "MyChart".  Sign up information is provided on this After Visit Summary.  MyChart is used to connect with patients for Virtual Visits (Telemedicine).  Patients are able to view lab/test results, encounter notes, upcoming appointments, etc.  Non-urgent messages can be sent to your provider as well.   ?To learn more about what you can do with MyChart, go to [email or web address].   ? ?Your next appointment:   ?12 month(s) ? ?The format for your next appointment:   ?In Person ? ?Provider:   ?Skeet Latch, MD{ ? ?[place] NP OR PHARM D  ?

## 2021-11-01 ENCOUNTER — Other Ambulatory Visit (HOSPITAL_COMMUNITY): Payer: Self-pay

## 2021-11-02 ENCOUNTER — Other Ambulatory Visit (HOSPITAL_COMMUNITY): Payer: Self-pay

## 2021-11-06 ENCOUNTER — Other Ambulatory Visit (HOSPITAL_COMMUNITY): Payer: Self-pay

## 2021-11-07 ENCOUNTER — Ambulatory Visit: Payer: 59

## 2021-11-07 ENCOUNTER — Other Ambulatory Visit (HOSPITAL_COMMUNITY): Payer: Self-pay

## 2021-11-07 DIAGNOSIS — R0683 Snoring: Secondary | ICD-10-CM

## 2021-11-07 MED ORDER — JORNAY PM 60 MG PO CP24
ORAL_CAPSULE | ORAL | 0 refills | Status: DC
  Filled 2021-11-07 (×2): qty 30, 30d supply, fill #0

## 2021-11-09 ENCOUNTER — Other Ambulatory Visit (HOSPITAL_COMMUNITY): Payer: Self-pay

## 2021-11-12 ENCOUNTER — Other Ambulatory Visit (HOSPITAL_COMMUNITY): Payer: Self-pay

## 2021-11-13 ENCOUNTER — Other Ambulatory Visit (HOSPITAL_COMMUNITY): Payer: Self-pay

## 2021-11-14 DIAGNOSIS — R0683 Snoring: Secondary | ICD-10-CM | POA: Diagnosis not present

## 2021-11-15 ENCOUNTER — Other Ambulatory Visit (HOSPITAL_COMMUNITY): Payer: Self-pay

## 2021-11-19 ENCOUNTER — Other Ambulatory Visit (HOSPITAL_COMMUNITY): Payer: Self-pay

## 2021-11-23 ENCOUNTER — Other Ambulatory Visit (HOSPITAL_COMMUNITY): Payer: Self-pay

## 2021-11-26 ENCOUNTER — Other Ambulatory Visit (HOSPITAL_COMMUNITY): Payer: Self-pay

## 2021-11-26 DIAGNOSIS — L304 Erythema intertrigo: Secondary | ICD-10-CM | POA: Diagnosis not present

## 2021-11-26 DIAGNOSIS — L209 Atopic dermatitis, unspecified: Secondary | ICD-10-CM | POA: Diagnosis not present

## 2021-11-26 DIAGNOSIS — R61 Generalized hyperhidrosis: Secondary | ICD-10-CM | POA: Diagnosis not present

## 2021-11-26 DIAGNOSIS — L669 Cicatricial alopecia, unspecified: Secondary | ICD-10-CM | POA: Diagnosis not present

## 2021-11-26 MED ORDER — NYSTATIN 100000 UNIT/GM EX POWD
CUTANEOUS | 5 refills | Status: DC
  Filled 2021-11-26: qty 60, 30d supply, fill #0
  Filled 2022-02-19: qty 60, 10d supply, fill #1

## 2021-11-26 MED ORDER — CLOBETASOL PROPIONATE 0.05 % EX OINT
TOPICAL_OINTMENT | CUTANEOUS | 3 refills | Status: AC
  Filled 2021-11-26: qty 60, 30d supply, fill #0
  Filled 2022-06-02: qty 60, 30d supply, fill #1

## 2021-11-26 MED ORDER — TACROLIMUS 0.1 % EX OINT
TOPICAL_OINTMENT | CUTANEOUS | 3 refills | Status: AC
  Filled 2021-11-26: qty 60, 30d supply, fill #0
  Filled 2022-06-02: qty 60, 30d supply, fill #1

## 2021-11-27 ENCOUNTER — Other Ambulatory Visit (HOSPITAL_COMMUNITY): Payer: Self-pay

## 2021-12-07 ENCOUNTER — Ambulatory Visit (INDEPENDENT_AMBULATORY_CARE_PROVIDER_SITE_OTHER): Payer: 59 | Admitting: Psychology

## 2021-12-07 NOTE — Progress Notes (Addendum)
Date of Evaluation: 12/07/2021 ?Time Seen: 7am ?Duration: 80 minutes ?Type of Session: Inittial Appointment for Evaluation  ?Location of Patient: Home (private location) ?Location of Provider: At home in a private office due to COVID-19 pandemic ?Type of Contact: WebEx video visit with audio ? ?Prior to proceeding with today's appointment, two pieces of identifying information were obtained from [name] to verify identity. In addition, Madison Robertson's physical location at the time of this appointment was obtained. In the event of technical difficulties, Madison Robertson shared a phone number they could be reached at. Madison Robertson and this provider participated in today's telepsychological service. Madison Robertson denied anyone else being present in the room or on the virtual appointment.The provider's role was explained to Madison Robertson. The provider reviewed and discussed issues of confidentiality, privacy, and limits therein (e.g., reporting obligations). In addition to verbal informed consent, written informed consent for psychological services was obtained from Madison Robertson prior to the initial intake interview. Written consent included information concerning the practice, financial arrangements, and confidentiality and patients' rights. Since the clinic is not a 24/7 crisis center, mental health emergency resources were shared, and the provider explained MyChart, e-mail, voicemail, and/or other messaging systems should be utilized only for non-emergency reasons. This provider also explained that information obtained during appointments will be placed in their electronic medical chart in a confidential manner. Madison Robertson verbally acknowledged understanding of the aforementioned and agreed to use mental health emergency resources discussed if needed. Moreover, Madison Robertson agreed information may be shared with other [facility] providers as needed for coordination of care. By signing the new patient documents, Madison Robertson provided  written consent for coordination of care. Madison Robertson verbally acknowledged understanding she is ultimately responsible for understanding her insurance benefits as it relates to reimbursement of telepsychological and in-person services. This provider also reviewed confidentiality, as it relates to telepsychological services, as well as the rationale for telepsychological services. More specifically, this provider's clinic is providing telepsychological services to reduce exposure to COVID-19. The patient expressed understanding regarding the rationale for telepsychological services. Madison Robertson verbally consented to proceed. ? ?Madison Robertson completed the psychiatric diagnostic evaluation (complete history, including past, family, and social history, psychiatric history, and establishment of a tentative diagnosis) and the bariatric assessment for a total of 80 minutes. Code [phone] was billed.  ? ?Additionally, during this initial appointment this provider completed the scoring of the following measures: Beck Anxiety Inventory (BAI) (5 minutes), Beck?s Depression Index (5 minutes), Eating Disorder Diagnostic Scale (EDDS) (10 minutes [administration and scoring]), Mini Mental Status Examination (MMSE) (10 minutes [administration and scoring]), & Mood Disorder Questionnaire (MDQ) (5 minutes [administration and scoring]). A total of 35 minutes was spent on the scoring of the aforementioned measures and code [phone] was billed.  ? ?Mental Status Examination:  ?Appearance:  neat ?Behavior: appropriate to circumstances ?Mood: euthymic ?Affect: mood congruent ?Speech: WNL ?Eye Contact: appropriate ?Psychomotor Activity: WNL ?Thought Process: linear, logical, and goal directed and denies suicidal, homicidal, and self-harm ideation, plan and intent ?Content/Perceptual Disturbances: none ?Orientation: AAOx4 ?Cognition/Sensorium: intact ?Insight: good ?Judgment: good ? ?Time Requirements: ?Interview: 80 minutes (billing code [phone]) ?Assessment  scoring and interpreting: 35 minutes (billing code [phone]) ? ?DSM-5 Diagnosis(es) Code: E66.01 Morbid Obesity ? ?Plan: Madison Robertson is currently scheduled for a feedback appointment on 12/20/2021 at 5pm via WebEx video visit with audio.  ? ? ?Bariatric Evaluation  ?  ?CONFIDENTIAL  ?  ?Client Name: Madison Robertson  MRN: [id number] ?Date of Birth:  [birth year]                                                             Date of Evaluation: 12/07/2021 ?Total Assessment Time:  Minutes                                              Date of Report: 12/08/2021 ?Evaluator: Madison Robertson, Psy.D.                                 Referring Physician: Dr. Gurney Maxin, [facility] ? ?Reason for Referral: Madison Robertson reported she was referred for an evaluation to ?make sure I am mentally stable enough to have a gastric sleevectomy.? Per referral paperwork, she is ?most interested in laparoscopic sleeve gastrectomy? and ?meets weight loss surgery criteria.? ? ?Sources of Information ?Clinical Interview ?Bariatric Questionnaire ?[place] Interview for Gastric Bypass ?Beck Anxiety Inventory (BAI) ?Beck Depression Inventory, 2nd Edition (BDI-II) ?Eating Disorder Diagnostic Scale (EDDS) ?Mini-Mental State Examination (MMSE)  ?Mood Disorder Questionnaire (MDQ) ?Review of Medical Record (provided by [facility]) ? ?Patient Identification and Chief Complaint: Madison Robertson currently resides in [place], [place] noting she lives with her mother whom she gets along with ?great.? She stated she has an associate degree in medical assistance. She denied a history of learning diagnosis or grade retentions. 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 during days they are short-staffed.   ? ?Madison Robertson discussed a belief weight loss may help with improve mood, ?feel comfortable in [her] skin,? being able to ?walk into a store and be able to find  clothing,? and achieving a goal of ?coming off [her] medications.? She shared her social support system consists of her mother, ?several friends,? and ?some coworkers.? Ms. Grossberg reported a belief there would be no impact on her relationships if she were to lose weight, adding ?everyone is supportive [of her pursuit of bariatric surgery].? She described herself and her mother as the primary cooks in the house.   ? ?Ms. [place] and referral paperwork reported her medical history is significant for the following: PE, VTE, hypertension, and IBS. Her family medical history is significant for hyperlipidemia, obesity, hypertension, diabetes, stroke, coronary artery disease, colon cancer, and breast cancer. Ms. Aristizabal reported she is medication compliant and does not have any issues with her current medications.  ? ?Current Medications: ?Bupropion '150MG'$  XL ?Trulicity 0.'75mg'$  ?Dupixent Pen '300mg'$ /21m pen ?Linzess 1462m ?Losartan '50MG'$  ?Metoprolol succinate '25MG'$  Xl ?Vylibra 0.25-'35mg'$ -mcg ?Promethazine '25MG'$  ? ?Ms. Rigsby discussed having been diagnosed with ADHD in elementary school and that it causes concentration and task initiation issues during responsibilities she does not find stimulating as well as ?emotional snacking? when ?bored.? She also discussed being diagnosed with depression in 2017 and experiencing panic attacks secondary to IBS issues and employment stressors, although shared her periods of low mood do not last longer than three days and the last instance of low mood was in April of 2023. She stated her last panic attack was ?  years ago.? She reported use of one standard size alcoholic drink per month, expressing awareness of the importance of ceasing use of alcohol after bariatric surgery and confidence in her ability to do so. She denied ever meeting full criteria for an anxiety disorder; experiencing abuse or neglect; having used mental health services; being psychiatrically hospitalized; or experiencing suicidal  ideation, plan, or intent. She described her coping strategies include processing her emotional experiences with coworkers on ?tough days,? walking, taking breaks during tasks she does not enjoy, diaphragmat

## 2021-12-16 NOTE — Progress Notes (Signed)
Office Visit    Patient Name: Madison Robertson Date of Encounter: 12/17/2021  PCP:  Inda Coke, [place]  Cardiologist:  Skeet Latch, MD  Advanced Practice Provider:  No care team member to display Electrophysiologist:  None     Chief Complaint    Madison Robertson is a 31 y.o. female with a hx of hypertension, hyperlipidemia, prediabetes, asthma, anxiety, edema presents today for hypertension follow-up  Past Medical History    Past Medical History:  Diagnosis Date   ADD (attention deficit disorder)    Allergy    Anxiety    Asthma    childhood asthma - pt out grew   CAD in native artery 04/14/2020   Depression    Edema, lower extremity    Heart murmur    Hyperlipidemia    Has never been on medication for this   Hypertension    Lisinopril-HCTZ   IBS (irritable bowel syndrome)    Lactose intolerance    Migraines    well-controlled when blood pressure is controlled   Morbid obesity ([place]) 02/07/2020   Palpitations 02/07/2020   PCOS (polycystic ovarian syndrome)    10 years; was on metformin for 2 months but didn't like the way it made her feel; has never been on spironolactone   Pre-diabetes    Hemoglobin A1c 6.0 in September 2019   Pure hypercholesterolemia 02/07/2020   Past Surgical History:  Procedure Laterality Date   WISDOM TOOTH EXTRACTION     Feb 2017    Allergies  Allergies  Allergen Reactions   Elderberry     Throat swelling   Amlodipine     edema   Hydrocodone Nausea And Vomiting   Lisinopril Cough    History of Present Illness    Madison Robertson is a 31 y.o. female with a hx of hypertension, hyperlipidemia, prediabetes, asthma, anxiety, edema last seen 10/31/2021 by Dr. Oval Linsey.  Initially seen July 2021 for tachycardia and family history of coronary disease.  Her ADHD medication was held and her heart rate came down to the 90s.  She started back on the medication and her heart rate has been consistently  in the 100s.  She was minimally bothered by symptoms.  Echocardiogram 01/2020 LVEF 60 to 81%, normal diastolic function.  She reported lower extremity edema but there was no evidence of volume overload by echocardiogram.  Hypertension initially diagnosed around age 1.  Previously was on lisinopril but developed cough and she was transitioned to losartan by PCP.  At last clinic visit she noted she was working toward gastric bypass tentatively in September.  She was transition to a patch for the ADHD medication and her heart rate was in the 90s to 100s whereas previously was in the 130s-140s.  Due to elevated blood pressure her losartan was stopped and she was started on valsartan 320 mg daily.  Since last seen she underwent a home sleep study that showed very minimal sleep apnea.   She presents today for follow-up with her mom. Tells me her blood pressure at home had been 150/110 so she scheduled an appointment. She transitioned to taking it to night without much change then moved it back to the morning. Her blood pressure has been 130s/100s over the last 2 weeks. Usually checks her blood pressure twice per day - once at home and once at work manually.  Has not taken her Lasix in nearly 6 months with only ankle edema 2-3 times  per week which is overall not bothersome.   Reports no shortness of breath nor dyspnea on exertion. Reports no chest pain, pressure, or tightness. No orthopnea, PND. Reports no palpitations.     EKGs/Labs/Other Studies Reviewed:   The following studies were reviewed today:  Echo 01/2020   1. Normal coronary artery origins.   2. Left ventricular ejection fraction, by estimation, is 60 to 65%. The  left ventricle has normal function. The left ventricle has no regional  wall motion abnormalities. Left ventricular diastolic parameters were  normal.   3. Right ventricular systolic function is normal. The right ventricular  size is normal.   4. The mitral valve is normal in  structure. Trivial mitral valve  regurgitation. No evidence of mitral stenosis.   5. The aortic valve is normal in structure. Aortic valve regurgitation is  not visualized. No aortic stenosis is present.   6. The inferior vena cava is normal in size with greater than 50%  respiratory variability, suggesting right atrial pressure of 3 mmHg.   EKG: No EKG today  Recent Labs: 02/21/2021: ALT 8; BUN 9; Creatinine, Ser 0.73; Hemoglobin 12.6; Platelets 214.0; Potassium 3.6; Sodium 139; TSH 0.81  Recent Lipid Panel    Component Value Date/Time   CHOL 166 02/21/2021 1114   CHOL 177 01/25/2019 1145   TRIG 91.0 02/21/2021 1114   HDL 47.70 02/21/2021 1114   HDL 54 01/25/2019 1145   CHOLHDL 3 02/21/2021 1114   VLDL 18.2 02/21/2021 1114   LDLCALC 100 (H) 02/21/2021 1114   LDLCALC 109 (H) 01/25/2019 1145      Home Medications   Current Meds  Medication Sig   aluminum chloride (DRYSOL) 20 % external solution Apply to underarms daily to every other day under occlusion as tolerated   azelastine (ASTELIN) 0.1 % nasal spray Place 1 spray into both nostrils 2 times daily.   buPROPion (WELLBUTRIN XL) 300 MG 24 hr tablet Take 1 tablet (300 mg total) by mouth daily.   cholecalciferol (VITAMIN D) 25 MCG (1000 UNIT) tablet Take 1 tablet by mouth daily   clobetasol ointment (TEMOVATE) 0.05 % Apply to the thick itchy areas twice daily. NEVER apply to face or groin. Use twice daily for two weeks, then once daily, then every other day, then switch to triamcinolone.   clobetasol ointment (TEMOVATE) 0.05 % Apply to the affected area of scalp 4-5 times weekly.   Dulaglutide (TRULICITY) 1.5 HD/6.2IW SOPN Inject 1.5 mg into the skin once a week.   Dupilumab (DUPIXENT) 300 MG/2ML SOPN Inject 2 mLs (300 mg total) into the skin every 14 days.   furosemide (LASIX) 20 MG tablet TAKE 1 TABLET (20 MG TOTAL) BY MOUTH DAILY AS NEEDED FOR FLUID.   hydrocortisone 2.5 % cream Apply to the face twice daily.   ibuprofen  (ADVIL,MOTRIN) 200 MG tablet Take 400 mg by mouth every 6 (six) hours as needed for fever, headache, moderate pain or cramping.    linaclotide (LINZESS) 72 MCG capsule Take 1 capsule by mouth daily.   methylphenidate (DAYTRANA) 30 MG/9HR Place 1 patch on the skin daily   Methylphenidate HCl ER, PM, (JORNAY PM) 60 MG CP24 Take 1 capsule by mouth at bedtime   norgestimate-ethinyl estradiol (VYLIBRA) 0.25-35 MG-MCG tablet TAKE 1 TABLET BY MOUTH DAILY.   nystatin (MYCOSTATIN/NYSTOP) powder Apply under the breasts daily x 10 days as needed for flares of intertrigo.   nystatin (MYCOSTATIN/NYSTOP) powder Apply under the breasts daily for 10 days as needed for flares  of intertrigo.   tacrolimus (PROTOPIC) 0.1 % ointment Apply once daily to the affected areas.   tacrolimus (PROTOPIC) 0.1 % ointment Apply once daily to the affected areas.   triamcinolone ointment (KENALOG) 0.1 % Apply to the itchy areas twice daily. NEVER to the face.   valsartan (DIOVAN) 320 MG tablet Take 1 tablet (320 mg total) by mouth daily.   Current Facility-Administered Medications for the 12/17/21 encounter (Office Visit) with Loel Dubonnet, NP  Medication   0.9 %  sodium chloride infusion     Review of Systems      All other systems reviewed and are otherwise negative except as noted above.  Physical Exam    VS:  BP (!) 138/98   Pulse (!) 119   Ht 5' 7.75" (1.721 m)   Wt (!) 301 lb (136.5 kg)   SpO2 97%   BMI 46.11 kg/m  , BMI Body mass index is 46.11 kg/m.  Wt Readings from Last 3 Encounters:  12/17/21 (!) 301 lb (136.5 kg)  10/31/21 295 lb (133.8 kg)  10/31/21 295 lb 12.8 oz (134.2 kg)     GEN: Well nourished, well developed, in no acute distress. HEENT: normal. Neck: Supple, no JVD, carotid bruits, or masses. Cardiac: RRR, no murmurs, rubs, or gallops. No clubbing, cyanosis, edema.  Radials/PT 2+ and equal bilaterally.  Respiratory:  Respirations regular and unlabored, clear to auscultation  bilaterally. GI: Soft, nontender, nondistended. MS: No deformity or atrophy. Skin: Warm and dry, no rash. Neuro:  Strength and sensation are intact. Psych: Normal affect.  Assessment & Plan    HTN - Since transition to Valsartan BP 130s/100s. BP goal <130/80. Continue Valsartan '320mg'$  QD. Start Chlorthalidone 12.'5mg'$  QD. BMP in 2 weeks.  Check in via MyChart in 2 weeks - if BP above goal plan to increase to '25mg'$ . Heart healthy diet and regular cardiovascular exercise encouraged.  Future considerations include transition from Metoprolol to Carvedilol.   Morbid obesity - Weight loss via diet and exercise encouraged. Discussed the impact being overweight would have on cardiovascular risk. Tentative plan for bariatric surgery in September.  Palpitations - Quiescent on Metoprolol '25mg'$  QD.   Hyperlipidemia -LDL goal less than 100. Continue to follow with PCP.      Disposition: Follow up in 3 month(s) with Skeet Latch, MD or APP.  Signed, Loel Dubonnet, NP 12/17/2021, 8:16 AM [place]

## 2021-12-17 ENCOUNTER — Other Ambulatory Visit (HOSPITAL_COMMUNITY): Payer: Self-pay

## 2021-12-17 ENCOUNTER — Other Ambulatory Visit: Payer: Self-pay | Admitting: Family Medicine

## 2021-12-17 ENCOUNTER — Encounter (HOSPITAL_BASED_OUTPATIENT_CLINIC_OR_DEPARTMENT_OTHER): Payer: Self-pay | Admitting: Family

## 2021-12-17 ENCOUNTER — Ambulatory Visit (HOSPITAL_BASED_OUTPATIENT_CLINIC_OR_DEPARTMENT_OTHER): Payer: 59 | Admitting: Family

## 2021-12-17 VITALS — BP 138/98 | HR 119 | Ht 67.75 in | Wt 301.0 lb

## 2021-12-17 DIAGNOSIS — E782 Mixed hyperlipidemia: Secondary | ICD-10-CM

## 2021-12-17 DIAGNOSIS — I10 Essential (primary) hypertension: Secondary | ICD-10-CM | POA: Diagnosis not present

## 2021-12-17 MED ORDER — CHLORTHALIDONE 25 MG PO TABS
12.5000 mg | ORAL_TABLET | Freq: Every day | ORAL | 1 refills | Status: DC
  Filled 2021-12-17: qty 45, 90d supply, fill #0

## 2021-12-17 NOTE — Patient Instructions (Addendum)
Medication Instructions:  Your physician has recommended you make the following change in your medication:   START Chlorthalidone half tablet (12.'5mg'$ ) once daily   *If you need a refill on your cardiac medications before your next appointment, please call your pharmacy*   Lab Work: Your physician recommends that you return for lab work in 2 weeks for BMP.  You do NOT need to be fasting.   If you have labs (blood work) drawn today and your tests are completely normal, you will receive your results only by: [place] (if you have MyChart) OR A paper copy in the mail If you have any lab test that is abnormal or we need to change your treatment, we will call you to review the results.   Testing/Procedures: None ordered today.    Follow-Up: At [facility], you and your health needs are our priority.  As part of our continuing mission to provide you with exceptional heart care, we have created designated Provider Care Teams.  These Care Teams include your primary Cardiologist (physician) and Advanced Practice Providers (APPs -  Physician Assistants and Nurse Practitioners) who all work together to provide you with the care you need, when you need it.  We recommend signing up for the patient portal called "MyChart".  Sign up information is provided on this After Visit Summary.  MyChart is used to connect with patients for Virtual Visits (Telemedicine).  Patients are able to view lab/test results, encounter notes, upcoming appointments, etc.  Non-urgent messages can be sent to your provider as well.   To learn more about what you can do with MyChart, go to [email or web address].    Your next appointment:   3 month(s)  The format for your next appointment:   In Person  Provider:   Skeet Latch, MD or Laurann Montana, NP    Other Instructions  Check in via MyChart in 2 weeks to reassess blood pressure. Blood pressure goal less than 130/80.   Heart Healthy Diet  Recommendations: A low-salt diet is recommended. Meats should be grilled, baked, or boiled. Avoid fried foods. Focus on lean protein sources like fish or chicken with vegetables and fruits. The American Heart Association is a Microbiologist!  American Heart Association Diet and Lifeystyle Recommendations   Exercise recommendations: The American Heart Association recommends 150 minutes of moderate intensity exercise weekly. Try 30 minutes of moderate intensity exercise 4-5 times per week. This could include walking, jogging, or swimming.   Important Information About Sugar

## 2021-12-18 ENCOUNTER — Other Ambulatory Visit (HOSPITAL_COMMUNITY): Payer: Self-pay

## 2021-12-18 MED ORDER — TRULICITY 1.5 MG/0.5ML ~~LOC~~ SOAJ
1.5000 mg | SUBCUTANEOUS | 3 refills | Status: DC
  Filled 2021-12-18: qty 2, 28d supply, fill #0

## 2021-12-19 ENCOUNTER — Other Ambulatory Visit (HOSPITAL_COMMUNITY): Payer: Self-pay

## 2021-12-19 ENCOUNTER — Telehealth: Payer: Self-pay | Admitting: *Deleted

## 2021-12-19 MED ORDER — JORNAY PM 60 MG PO CP24
ORAL_CAPSULE | ORAL | 0 refills | Status: DC
  Filled 2021-12-19: qty 30, 30d supply, fill #0

## 2021-12-19 NOTE — Telephone Encounter (Signed)
Key: [id number] - PA Case ID: [id number] Status Sent to Plan today Drug Trulicity 1.'5MG'$ /0.5ML pen-injectors Waiting for determination

## 2021-12-20 ENCOUNTER — Ambulatory Visit (INDEPENDENT_AMBULATORY_CARE_PROVIDER_SITE_OTHER): Payer: 59 | Admitting: Psychology

## 2021-12-20 DIAGNOSIS — F509 Eating disorder, unspecified: Secondary | ICD-10-CM

## 2021-12-20 NOTE — Progress Notes (Signed)
Date of Appointment: 12/20/2021  Time Seen: 5-5:35pm  Duration: 35 minutes (20 minutes feedback and 15 minutes report writing) Type of Session: Follow-up Appointment for Evaluation  Location of Patient: Home (private location) Location of Provider: At home in a private office due to COVID-19 pandemic Type of Contact: WebEx video visit with audio  Jaaliyah participated in the session via WebEx video visit with audio, from the privacy of their home due to the COVID-19 pandemic. Jessaca provided verbal consent to proceed with today's appointment. This provider participated from a private home office. Today's interactive feedback session was completed which included reviewing the following measures: Beck Anxiety Inventory (BAI), Beck's Depression Index (BDI), Eating Disorder Diagnostic Scale (EDDS), Mental Status Examination (MSE), & Mood Disorder Questionnaire (MDQ). During this interactive feedback session, this provider and Madison Robertson discussed the results of the aforementioned measures, treatment recommendations, and the final determination regarding the psychological approval for bariatric surgery.   Please see the bariatric assessment at the bottom of this note for additional details. This provider completed the written report which includes integration of patient data, interpretation of standardized test results, interpretation of clinical data, review of referral from surgeon & clinical decision making (105 minutes in total).  The interactive feedback session was completed today and a total of 35 minutes was spent on feedback and report writing. Code 16553 was billed for feedback session.   Time Requirements: Feedback: 35 total minutes (billing code [phone]) Report writing: Report writing: 11/28/2021: 10:40-11:15am. 12/08/2021: 2:30-3:40pm.  (billing codes [phone] [2 units])  DSM-5 Diagnosis(es) code: E66.01 Morbid Obesity  Plan: Marybell provided verbal consent for her evaluation to be sent to [facility]. No further follow-up planned by this provider.     Bariatric Evaluation    CONFIDENTIAL    Client Name: Madison Robertson                       MRN: [id number] Date of Birth:  01-21-1991                                                             Date of Evaluation: 12/07/2021 Total Assessment Time:  Minutes                                              Date of Report: 12/18/2021 Evaluator: Clarice Pole, Psy.D.                                 Referring Physician: Dr. Gurney Maxin, [facility]  Reason for Referral: Madison Robertson reported she was referred for an evaluation to "make sure I am mentally stable enough to have a gastric sleevectomy." Per referral paperwork, she is "most interested in laparoscopic sleeve gastrectomy" and "meets weight loss surgery criteria."  Sources of Information Clinical Interview Bariatric Questionnaire [place] Interview for Gastric Bypass Beck Anxiety Inventory (BAI) Beck Depression Inventory, 2nd Edition (BDI-II) Eating Disorder Diagnostic Scale (EDDS) Mini-Mental State Examination (MMSE)  Mood Disorder Questionnaire (MDQ) Review of Medical Record (provided by [facility])  Patient Identification and Chief Complaint: Madison Robertson currently resides in [place],  [place] noting she lives with her mother whom she gets along with "great." She stated she has an associate degree in medical assistance. She denied a history of learning diagnosis or grade retentions. 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    Madison Robertson discussed a belief weight loss may help with improve mood, "feel comfortable in [her] skin," being able to "walk into a store and be able to find clothing," and achieving a goal of "coming off [her] medications." She shared her social support system consists of her mother, "several friends," and "some coworkers."  [name] reported a belief there would be no impact on her relationships if she were to lose weight, adding "everyone is supportive [of her pursuit of bariatric surgery]." She described herself and her mother as the primary cooks in the house.    Madison Robertson and referral paperwork reported her medical history is significant for the following: PE, VTE, hypertension, and IBS. Her family medical history is significant for hyperlipidemia, obesity, hypertension, diabetes, stroke, coronary artery disease, colon cancer, and breast cancer. Madison Robertson reported she is medication compliant and does not have any issues with her current medications.   Current Medications: Bupropion 543KG XL Trulicity 6.77CH Dupixent Pen 360m/2mL pen Linzess 1425m Losartan 50MG Metoprolol succinate 25MG Xl Vylibra 0.25-35mg-mcg Promethazine 25MG  [name] discussed having been diagnosed with ADHD in elementary school and that it causes concentration and task initiation issues during responsibilities she does not find stimulating as well as "emotional snacking" when "bored." She also discussed being diagnosed with depression in 2017 and experiencing panic attacks secondary to IBS issues and employment stressors, although shared her periods of low mood do not last longer than three days and the last instance of low mood was in April of 2023. She stated her last panic attack was "years ago." She reported use of one standard size alcoholic drink per month, expressing awareness of the importance of ceasing use of alcohol after bariatric surgery and confidence in her ability to do so. She denied ever meeting full criteria for an anxiety disorder; experiencing abuse or neglect; having used mental health services; being psychiatrically hospitalized; or experiencing suicidal ideation, plan, or intent. She described her coping strategies include processing her emotional experiences with coworkers on "tough days," walking, taking breaks  during tasks she does not enjoy, diaphragmatic breathing, and use of psychotropic medication, which she shared has been helpful. She noted a familial history of depression (mother), suicide (great uncle), and possible problematic alcohol use (father).  Patient's Understanding of the Procedure/Risks of Surgery: Ms. PoKolleeported two of her coworkers have previously had bariatric surgery, adding they have been supporting and assisting her in her pursuit of bariatric surgery. She stated she is pursuing gastric sleevectomy which involves "removing about 80% of the stomach which restricts how much [she] can eat" and "leaves the stomach looking like a banana." She described awareness of anesthesia being administered during the procedure and noted the surgery can cause "not waking up after the surgery," "GI bleed," death, "liver and intestine complications," infection, nausea, vomiting, diarrhea, dumping syndrome, nutritional deficiencies, and acid reflux. She shared there would be a postoperative hospital stay and she would be unable to return to work/daily activities for two-to-three weeks. She reported her primary motivations for the procedure include increased day-to-day mobility, feeling more comfortable when socializing with others, improved health, and improved appearance. She  expressed an expectation to lose 75-90 pounds following the surgery, noting maximum weight loss could take at least 1.5 years or so.   Ms. Rode reported confidence in her ability to implement food restrictions following gastric surgery, noting she is aware of the negative repercussions of not following dietary requirements as well as has been reducing her portion sizes and use of carbonated beverages by using water flavor enhancers as an alternative, decreasing sugary food consumption by finding sugar free and reduced sugar alternatives, and practicing chewing her food more carefully. She also noted plans to continue notifying others she  will be bringing appropriate food items for herself to social events and increasing exercise through use of her home treadmill and workout equipment at work. She expressed confidence in her ability to obtain and prepare food. She reported a belief her living environment would assist her in her attempts to control her eating. Following surgery, she reported awareness she will be primarily consuming liquids and portion sizes will be small. When able to start eating solid foods, she indicated she would have to consume vegetables, low sugar fruits, and lean meats (e.g., seafood and poultry). Ms. Puskarich reported awareness she would have to avoid or limit fried and sugary foods, fatty meats, high calorie liquids, carbonated beverages, and caffeine. Optimally, she indicated she would consume three small meals with snacks in-between, chew her food carefully and to "applesauce consistency," and not drink beverages thirty minutes before or after a meal.   History of Weight Gains and Losses: Ms. Eno described being fairly active, noting she "does lots of walking at [her] employment," uses a treadmill at home, walks at stores, and completes various household chores. She discussed her past weight loss efforts have included current use of nutritional services (helpful), Trulicity in 9147 (initially "okay but then plateaued"), [facility] in 2020 (diet was helpful but insurance issues were causing financial strain), and Weight Watchers in 2018 and 2019 (loss 15-20lbs).   Eating Behaviors: Ms. Adcox denied a history of binge- and purge-related behaviors, although noted approximately once a month she eats more than usual throughout the day. She expressed awareness of the importance of ceasing overeating-related behaviors, stating plans to use a protein shake, sugar free hard candy, and/or a glass of water during food "cravings."  She also reported "boredom snacking" (e.g., 3-4 Oreos or "a  handful of Pringles"), drinking one 12oz soda a day (prior use was 3-4 16.9oz sodas a day), taking multivitamins and minerals, and drinking 40oz of water daily. She discussed awareness of the importance of ceasing the aforementioned, noting successful efforts to reduce her soda use and plans to increase her use of pleasurable activities (e.g., arts and crafts).    Current Diet and Plans for the Future:  Breakfast Jimmy Dean bacon or ham breakfast bowl, piece of toast, and a cup of juice.  Lunch Salad, chicken nuggets, fries, or pasta.  Dinner Chicken or [place], "some type of carb" (e.g., potatoes, rice, or macaroni and cheese), a vegetable, and a soda.   Snacks Candy bar, cookies, or chips and a soda.     Mental Status Examination: Ms. Baird presented on time to the session and completed all the necessary paperwork. She was dressed casually, and her hygiene was observed to be good. Ms. Hayne was oriented to person, place, time, and purpose of appointment. Her attitude was cooperative and cheerful. There were no unusual psychomotor movements or changes. Speech patterns were normal in rate, tone, volume,  and without pressure. Affect was reactive and mood congruent. Thought processes were goal-directed and logical. Insight and judgement were good. The Mini-Mental State Examination (MMSE) was administered. She scored a 30/30, which is indicative of normal cognitive functioning.    Psychological Functioning: Ms. Sutherland completed the Mood Disorder Questionnaire (MDQ). She scored a 6/13 and endorsed several of the endorsed symptoms have occurred during the same period but have caused only minor problems, which is a negative screening for bipolar-related disorder. On the Beck Anxiety Inventory (BAI), Ms. Larranaga scored a 15/63, which is indicative of mild anxiety symptomatology. On the Beck Depression Inventory (BDI-II), Ms. Diliberto scored a 27/63, which is indicative of moderate depression symptomatology. The Eating  Disorder Diagnostic Scale (EDDS) was administered. Ms. Napoleon indicated her weight and shape have impacted how she views herself as a person. She endorsed eating an unusually large amount of food and experiencing a loss of control approximately four times a week over the past six months, noting during these periods she eats more rapidly than normal, eats until uncomfortably full, eats large amounts of food when she does not feel physically hungry, eats alone because she is embarrassed by how much she is eating, and feels negatively about the overeating or resulting weight gain. Upon follow-up, Ms. Tsan denied this behavior is currently occurring and noted her responses were referencing previously described emotional eating-related concerns. She stated since this time she has replaced emotional eating with regular meals throughout the day and being more mindful of her food choices. She denied experiencing any other problematic eating behaviors.   Conclusions & Recommendations: Ms. Anyra Kaufman is a 31 year old female who was referred to the [place] division by Dr. Gurney Maxin at [facility] for a psychological evaluation to determine her suitability for bariatric surgery.   Regarding bariatric surgery, Ms. Meggison appears to be highly motivated and has a good understanding of the bariatric surgery as well as risks and lifestyle changes needed to promote post-surgical weight loss success. Results of this evaluation yielded a history of ADHD, periods of low mood, and a remote history of panic attacks; daily use of 12oz of soda and infrequent use of a standard size drink of alcohol; and occasional emotional eating-related behaviors. She discussed awareness of the importance of managing her mental health concerns and following all dietary and lifestyle requirements, discussing successful efforts to do so as well as being open to utilizing mental health services. This Probation officer  recommended she utilize mental health services to assist her throughout the bariatric surgery process and she was agreeable to doing so.  12/20/2021 Addendum: Ms. Hoadley denied experiencing any significant changes since the last meeting with this evaluator. She discussed she has further reduced her soda use by reminding herself it is easier to cease use now than after bariatric surgery as well as the negative repercussions that can occur if she drinks carbonated beverages after surgery as well as having not engaged in emotional eating-related behaviors since last meeting with this evaluator. She expressed ongoing openness to utilizing mental health services to further assist in managing her mental health concerns and following dietary and lifestyle requirements. This evaluator provided mental health referral options to her via email per her verbal request. She denied having any other issues or concerns and expressed excitement about bariatric surgery.   At this time, Ms. Kramar appears to be able to make an informed decision about the surgery she is contemplating. She appears to be motivated and expressed understanding  of the post-surgical requirements. If Ms. Bridgeforth's surgery is scheduled more than three months from today's date (12/18/2021), she is required to schedule a follow-up visit for this examiner to briefly re-evaluate her psychological status at that time. This follow-up visit should occur within two months of her scheduled surgery date.  The following recommendations are offered to promote Ms. Chrisley's health and well-being:  It is recommended Ms. Supak participate in educational sessions regarding a healthy diet and post-operative meal planning with a dietician or other health care providers.   Re-evaluation. If Ms. Moll's surgery is scheduled more than three months from her date of approval, she is required to contact our office [phone]) for a brief check-in appointment within two months  from her surgery date.   Nutritional Counseling. Ms. Galanti is strongly encouraged to continue attending nutritional counseling appointments in order to plan a healthy diet and post-operative meals. She is encouraged to make recommended changes to her diet prior to surgery in order to increase the chances of continuing a healthy diet after surgery.   Exercise. Ms. Ferdinand is encouraged to participate in educational sessions on exercise that will be appropriate for her medical conditions and support her weight loss plans in a safe and healthy manner. Specifically, she is encouraged to consider participating in the Bariatric Exercise and Lifestyle Transformation (BELT) program, a partnership between [facility]. There, she will join fellow [facility] bariatric patients three times a week for personalized aerobic and strength training instruction, as well as educational sessions on diet, exercise and behavior modification strategies. BELT meets at [place] at [employer].  LegalPaid.ch  Support groups. Ms. Inda is encouraged to join a support group to give her encouragement as she faces the psychological adjustments of bariatric surgery and the need to significantly adjust one's meals and food choices. A list of support groups offered through [facility] can be found through the bariatrics department website: MetroMeds.nl  Self-help resources.  To develop strategies for managing emotional difficulties encountered before and after weight loss surgery, the patient is encouraged to read The Emotional First + Aid Kit: A Practical Guide to Life After Bariatric Surgery, Second Edition by Caren Griffins L. Sheppard Coil, PsyD. Examples of strategies discussed in this book include relieving stress without using food, developing and maintaining an exercise program,  preventing relapse, etc. Ms. Cyphers is strongly encouraged to practice mindful eating, the goal of which is to pay close attention to the smell, sight, taste, temperature, texture, etc. of food. Eating mindfully helps to eat slower while enjoying food more fully. Useful books on mindful eating include Savor: Mindful Eating, Mindful Life and How to Eat, both by mindfulness expert Thich Nhat Hanh.  Mental health treatment. For additional support either prior to or after the surgery, Ms. Tsutsui may consider seeking the care of a therapist (counselor, psychologist) in order to develop skills for coping with the adjustment to a new lifestyle. Available therapists include other clinicians within the [facility] [phone]). She may also seek other in providers in the community by searching online at DrivePages.com.ee.  General recommendations for bariatric surgery: Replace the habit of late-night snacking with something else (e.g., chewing gum, drinking water, or a relaxing activity like reading or crosswords that occupies your hands) and consider going to bed earlier.  Practice eating 4-6 meals per day. Each meal should last about 20 minutes. Practice drinking liquids 30 minutes before or after meals. Keep a food diary for  1 week. Record all foods eaten during the day, including snacks and drinks. Be very specific and very honest.  Get into the habit of reading food labels to evaluate content of protein, sugars, carbohydrates, sodium, etc.  Continue to eat lots of vegetables.  Prepare meals at home, rather than take-out or fast food.  Take multivitamins including zinc and iron.  Develop exercise plans, including a low-impact and safe exercise plan to start 4-5 weeks into recovery, and a more intensive exercise plan for later.  Determine who will take care of any major responsibilities (particularly those involving physical activity, such as childcare) in the early stages of  your recovery.  Educate family and friends who will be involved in your recovery about the extent and importance of your new lifestyle changes. The more they know, the better they can support you and help you stay on track!                Dolores Lory, PsyD

## 2021-12-24 NOTE — Telephone Encounter (Signed)
[facility] Rx Trulicity This request was denied because your condition does not meet the requirement for approval

## 2021-12-25 ENCOUNTER — Other Ambulatory Visit (HOSPITAL_COMMUNITY): Payer: Self-pay

## 2021-12-26 ENCOUNTER — Other Ambulatory Visit (HOSPITAL_COMMUNITY): Payer: Self-pay

## 2021-12-26 NOTE — Telephone Encounter (Signed)
Discussed with Aldona Bar that Madison Robertson is not available is on backorder for months.  Left message on voicemail to call office.

## 2021-12-28 ENCOUNTER — Other Ambulatory Visit: Payer: Self-pay | Admitting: Physician Assistant

## 2021-12-28 ENCOUNTER — Encounter (HOSPITAL_BASED_OUTPATIENT_CLINIC_OR_DEPARTMENT_OTHER): Payer: Self-pay

## 2021-12-28 ENCOUNTER — Other Ambulatory Visit (HOSPITAL_COMMUNITY): Payer: Self-pay

## 2021-12-28 MED ORDER — METOPROLOL SUCCINATE ER 25 MG PO TB24
37.5000 mg | ORAL_TABLET | Freq: Every day | ORAL | 3 refills | Status: DC
  Filled 2021-12-28: qty 135, 90d supply, fill #0
  Filled 2022-05-04: qty 135, 90d supply, fill #1

## 2021-12-28 NOTE — Telephone Encounter (Signed)
Left message on voicemail to call office.  

## 2021-12-28 NOTE — Telephone Encounter (Signed)
Pt called back told her Trulicity has been denied by insurance, and Aldona Bar was thinking about starting you on Wegovy but we just found out starting doses are not available. Pt verbalized understanding  and said she is anxious to see what her A1c is once she stops the medication, cause it has helped and appetite is decreased. Told pt you are doing for physical after 7/27, schedule for a physical and we will recheck A1c at that time. Pt verbalized understanding.

## 2021-12-29 ENCOUNTER — Other Ambulatory Visit (HOSPITAL_COMMUNITY): Payer: Self-pay

## 2021-12-31 ENCOUNTER — Other Ambulatory Visit (HOSPITAL_COMMUNITY): Payer: Self-pay

## 2021-12-31 MED ORDER — BUPROPION HCL ER (XL) 300 MG PO TB24
300.0000 mg | ORAL_TABLET | Freq: Every day | ORAL | 1 refills | Status: DC
  Filled 2021-12-31: qty 90, 90d supply, fill #0
  Filled 2022-05-04: qty 90, 90d supply, fill #1

## 2022-01-03 ENCOUNTER — Other Ambulatory Visit (HOSPITAL_COMMUNITY): Payer: Self-pay

## 2022-01-07 ENCOUNTER — Other Ambulatory Visit (HOSPITAL_COMMUNITY): Payer: Self-pay

## 2022-01-10 ENCOUNTER — Other Ambulatory Visit (HOSPITAL_COMMUNITY): Payer: Self-pay

## 2022-01-14 ENCOUNTER — Other Ambulatory Visit (HOSPITAL_COMMUNITY): Payer: Self-pay

## 2022-01-17 ENCOUNTER — Ambulatory Visit (INDEPENDENT_AMBULATORY_CARE_PROVIDER_SITE_OTHER): Payer: 59

## 2022-01-17 DIAGNOSIS — Z01818 Encounter for other preprocedural examination: Secondary | ICD-10-CM | POA: Diagnosis not present

## 2022-01-21 ENCOUNTER — Other Ambulatory Visit (HOSPITAL_COMMUNITY): Payer: Self-pay

## 2022-01-21 MED ORDER — JORNAY PM 60 MG PO CP24
ORAL_CAPSULE | ORAL | 0 refills | Status: DC
  Filled 2022-01-21: qty 30, 30d supply, fill #0

## 2022-01-23 ENCOUNTER — Other Ambulatory Visit (HOSPITAL_COMMUNITY): Payer: Self-pay

## 2022-01-24 ENCOUNTER — Other Ambulatory Visit (HOSPITAL_COMMUNITY): Payer: Self-pay

## 2022-01-30 ENCOUNTER — Other Ambulatory Visit (HOSPITAL_COMMUNITY): Payer: Self-pay

## 2022-02-04 DIAGNOSIS — Z79899 Other long term (current) drug therapy: Secondary | ICD-10-CM | POA: Diagnosis not present

## 2022-02-04 DIAGNOSIS — I10 Essential (primary) hypertension: Secondary | ICD-10-CM | POA: Diagnosis not present

## 2022-02-04 DIAGNOSIS — F900 Attention-deficit hyperactivity disorder, predominantly inattentive type: Secondary | ICD-10-CM | POA: Diagnosis not present

## 2022-02-05 ENCOUNTER — Encounter (HOSPITAL_BASED_OUTPATIENT_CLINIC_OR_DEPARTMENT_OTHER): Payer: Self-pay

## 2022-02-05 LAB — BASIC METABOLIC PANEL
BUN/Creatinine Ratio: 8 — ABNORMAL LOW (ref 9–23)
BUN: 7 mg/dL (ref 6–20)
CO2: 24 mmol/L (ref 20–29)
Calcium: 9.6 mg/dL (ref 8.7–10.2)
Chloride: 102 mmol/L (ref 96–106)
Creatinine, Ser: 0.92 mg/dL (ref 0.57–1.00)
Glucose: 83 mg/dL (ref 70–99)
Potassium: 4.1 mmol/L (ref 3.5–5.2)
Sodium: 140 mmol/L (ref 134–144)
eGFR: 84 mL/min/{1.73_m2} (ref >59)

## 2022-02-13 ENCOUNTER — Other Ambulatory Visit (HOSPITAL_COMMUNITY): Payer: Self-pay

## 2022-02-19 ENCOUNTER — Other Ambulatory Visit (HOSPITAL_COMMUNITY): Payer: Self-pay

## 2022-02-20 ENCOUNTER — Other Ambulatory Visit (HOSPITAL_COMMUNITY): Payer: Self-pay

## 2022-02-20 MED ORDER — JORNAY PM 60 MG PO CP24
1.0000 | ORAL_CAPSULE | Freq: Every day | ORAL | 0 refills | Status: DC
  Filled 2022-02-20: qty 30, 30d supply, fill #0

## 2022-02-21 ENCOUNTER — Other Ambulatory Visit (HOSPITAL_COMMUNITY): Payer: Self-pay

## 2022-02-25 ENCOUNTER — Encounter: Payer: Self-pay | Admitting: Skilled Nursing Facility1

## 2022-02-25 ENCOUNTER — Encounter: Payer: 59 | Attending: General Surgery | Admitting: Skilled Nursing Facility1

## 2022-02-25 DIAGNOSIS — K582 Mixed irritable bowel syndrome: Secondary | ICD-10-CM | POA: Diagnosis not present

## 2022-02-25 DIAGNOSIS — I10 Essential (primary) hypertension: Secondary | ICD-10-CM | POA: Insufficient documentation

## 2022-02-25 DIAGNOSIS — Z713 Dietary counseling and surveillance: Secondary | ICD-10-CM | POA: Diagnosis not present

## 2022-02-25 NOTE — Progress Notes (Signed)
Pre-Operative Nutrition Class:    Patient was seen on 02/25/2022 for Pre-Operative Bariatric Surgery Education at the [facility].    Surgery date:  Surgery type: sleeve Start weight at [facility]: 292.5 Weight today: 302.5  Samples given per MNT protocol. Patient educated on appropriate usage:  Bariatric Advantage Multivitamin Lot # [id number] Exp: 08/24   Bariatric Advantage Calcium  Lot # [id number] Exp: 11/07/2022   Protein Shake Lot # [id number] / [id number] Exp: 28 Apr 2022 /  29 Jul 2022  The following the learning objectives were met by the patient during this course: Identify Pre-Op Dietary Goals and will begin 2 weeks pre-operatively Identify appropriate sources of fluids and proteins  State protein recommendations and appropriate sources pre and post-operatively Identify Post-Operative Dietary Goals and will follow for 2 weeks post-operatively Identify appropriate multivitamin and calcium sources Describe the need for physical activity post-operatively and will follow MD recommendations State when to call healthcare provider regarding medication questions or post-operative complications When having a diagnosis of diabetes understanding hypoglycemia symptoms and the inclusion of 1 complex carbohydrate per meal  Handouts given during class include: Pre-Op Bariatric Surgery Diet Handout Protein Shake Handout Post-Op Bariatric Surgery Nutrition Handout BELT Program Information Flyer Support Group Information Flyer [facility] Bariatric Supplements Price List  Follow-Up Plan: Patient will follow-up at [facility] 2 weeks post operatively for diet advancement per MD.

## 2022-02-26 HISTORY — PX: LAPAROSCOPIC GASTRIC SLEEVE RESECTION: SHX5895

## 2022-03-06 ENCOUNTER — Encounter (INDEPENDENT_AMBULATORY_CARE_PROVIDER_SITE_OTHER): Payer: Self-pay

## 2022-03-07 NOTE — Progress Notes (Signed)
Pt. Needs orders for surgery. 

## 2022-03-07 NOTE — Patient Instructions (Signed)
DUE TO COVID-19 ONLY TWO VISITORS  (aged 33 and older)  ARE ALLOWED TO COME WITH YOU AND STAY IN THE WAITING ROOM ONLY DURING PRE OP AND PROCEDURE.   **NO VISITORS ARE ALLOWED IN THE SHORT STAY AREA OR RECOVERY ROOM!!**  IF YOU WILL BE ADMITTED INTO THE HOSPITAL YOU ARE ALLOWED ONLY FOUR SUPPORT PEOPLE DURING VISITATION HOURS ONLY (7 AM -8PM)   The support person(s) must pass our screening, gel in and out, and wear a mask at all times, including in the patient's room. Patients must also wear a mask when staff or their support person are in the room. Visitors GUEST BADGE MUST BE WORN VISIBLY  One adult visitor may remain with you overnight and MUST be in the room by 8 P.M.     Your procedure is scheduled on: 03/19/22   Report to [facility] Main Entrance    Report to admitting at : 5:15 AM   Call this number if you have problems the morning of surgery [phone]   MORNING OF SURGERY DRINK:   DRINK 1 G2 drink BEFORE YOU LEAVE HOME, DRINK ALL OF THE  G2 DRINK AT ONE TIME.   NO SOLID FOOD AFTER 600 PM THE NIGHT BEFORE YOUR SURGERY. YOU MAY DRINK CLEAR FLUIDS. THE G2 DRINK YOU DRINK BEFORE YOU LEAVE HOME WILL BE THE LAST FLUIDS YOU DRINK BEFORE SURGERY.  PAIN IS EXPECTED AFTER SURGERY AND WILL NOT BE COMPLETELY ELIMINATED. AMBULATION AND TYLENOL WILL HELP REDUCE INCISIONAL AND GAS PAIN. MOVEMENT IS KEY!  YOU ARE EXPECTED TO BE OUT OF BED WITHIN 4 HOURS OF ADMISSION TO YOUR PATIENT ROOM.  SITTING IN THE RECLINER THROUGHOUT THE DAY IS IMPORTANT FOR DRINKING FLUIDS AND MOVING GAS THROUGHOUT THE GI TRACT.  COMPRESSION STOCKINGS SHOULD BE WORN [place] UNLESS YOU ARE WALKING.   INCENTIVE SPIROMETER SHOULD BE USED EVERY HOUR WHILE AWAKE TO DECREASE POST-OPERATIVE COMPLICATIONS SUCH AS PNEUMONIA.  WHEN DISCHARGED HOME, IT IS IMPORTANT TO CONTINUE TO WALK EVERY HOUR AND USE THE INCENTIVE SPIROMETER EVERY HOUR.    After Midnight you may have the following liquids  until: 4:30 AM DAY OF SURGERY  Water Black Coffee (sugar ok, NO MILK/CREAM OR CREAMERS)  Tea (sugar ok, NO MILK/CREAM OR CREAMERS) regular and decaf                             Plain Jell-O (NO RED)                                           Fruit ices (not with fruit pulp, NO RED)                                     Popsicles (NO RED)                                                                  Juice: apple, WHITE grape, WHITE cranberry Sports drinks like Gatorade (NO RED)    Oral Hygiene is also important to reduce  your risk of infection.                                    Remember - BRUSH YOUR TEETH THE MORNING OF SURGERY WITH YOUR REGULAR TOOTHPASTE   Do NOT smoke after Midnight   Take these medicines the morning of surgery with A SIP OF WATER: bupropion,metoprolol,norgestimate.Use Flonase as usual.  DO NOT TAKE ANY ORAL DIABETIC MEDICATIONS DAY OF YOUR SURGERY  Bring CPAP mask and tubing day of surgery.                              You may not have any metal on your body including hair pins, jewelry, and body piercing             Do not wear make-up, lotions, powders, perfumes/cologne, or deodorant  Do not wear nail polish including gel and S&S, artificial/acrylic nails, or any other type of covering on natural nails including finger and toenails. If you have artificial nails, gel coating, etc. that needs to be removed by a nail salon please have this removed prior to surgery or surgery may need to be canceled/ delayed if the surgeon/ anesthesia feels like they are unable to be safely monitored.   Do not shave  48 hours prior to surgery.    Do not bring valuables to the hospital. [place].   Contacts, dentures or bridgework may not be worn into surgery.   Bring small overnight bag day of surgery.   DO NOT [place]. PHARMACY WILL DISPENSE MEDICATIONS LISTED ON YOUR MEDICATION LIST TO YOU  DURING YOUR ADMISSION [place]!    Patients discharged on the day of surgery will not be allowed to drive home.  Someone NEEDS to stay with you for the first 24 hours after anesthesia.   Special Instructions: Bring a copy of your healthcare power of attorney and living will documents         the day of surgery if you haven't scanned them before.              Please read over the following fact sheets you were given: IF YOU HAVE QUESTIONS ABOUT YOUR PRE-OP INSTRUCTIONS PLEASE CALL [phone]     [facility] - Preparing for Surgery Before surgery, you can play an important role.  Because skin is not sterile, your skin needs to be as free of germs as possible.  You can reduce the number of germs on your skin by washing with CHG (chlorahexidine gluconate) soap before surgery.  CHG is an antiseptic cleaner which kills germs and bonds with the skin to continue killing germs even after washing. Please DO NOT use if you have an allergy to CHG or antibacterial soaps.  If your skin becomes reddened/irritated stop using the CHG and inform your nurse when you arrive at Short Stay. Do not shave (including legs and underarms) for at least 48 hours prior to the first CHG shower.  You may shave your face/neck. Please follow these instructions carefully:  1.  Shower with CHG Soap the night before surgery and the  morning of Surgery.  2.  If you choose to wash your hair, wash your hair first as usual with your  normal  shampoo.  3.  After you shampoo, rinse your hair and body thoroughly to remove the  shampoo.                           4.  Use CHG as you would any other liquid soap.  You can apply chg directly  to the skin and wash                       Gently with a scrungie or clean washcloth.  5.  Apply the CHG Soap to your body ONLY FROM THE NECK DOWN.   Do not use on face/ open                           Wound or open sores. Avoid contact with eyes, ears mouth and genitals (private parts).                        Wash face,  Genitals (private parts) with your normal soap.             6.  Wash thoroughly, paying special attention to the area where your surgery  will be performed.  7.  Thoroughly rinse your body with warm water from the neck down.  8.  DO NOT shower/wash with your normal soap after using and rinsing off  the CHG Soap.                9.  Pat yourself dry with a clean towel.            10.  Wear clean pajamas.            11.  Place clean sheets on your bed the night of your first shower and do not  sleep with pets. Day of Surgery : Do not apply any lotions/deodorants the morning of surgery.  Please wear clean clothes to the hospital/surgery center.  FAILURE TO FOLLOW THESE INSTRUCTIONS MAY RESULT IN THE CANCELLATION OF YOUR SURGERY PATIENT SIGNATURE_________________________________  NURSE SIGNATURE__________________________________  ________________________________________________________________________

## 2022-03-08 ENCOUNTER — Other Ambulatory Visit: Payer: Self-pay

## 2022-03-08 ENCOUNTER — Encounter (HOSPITAL_COMMUNITY): Payer: Self-pay

## 2022-03-08 ENCOUNTER — Encounter (HOSPITAL_COMMUNITY)
Admission: RE | Admit: 2022-03-08 | Discharge: 2022-03-08 | Disposition: A | Discharge status: Home / Self Care | Payer: 59 | Source: Ambulatory Visit | Attending: General Surgery | Admitting: General Surgery

## 2022-03-08 VITALS — BP 138/98 | HR 92 | Temp 98.4°F | Ht 67.75 in | Wt 295.0 lb

## 2022-03-08 DIAGNOSIS — R7303 Prediabetes: Secondary | ICD-10-CM | POA: Insufficient documentation

## 2022-03-08 DIAGNOSIS — Z01812 Encounter for preprocedural laboratory examination: Secondary | ICD-10-CM | POA: Diagnosis not present

## 2022-03-08 DIAGNOSIS — Z01818 Encounter for other preprocedural examination: Secondary | ICD-10-CM

## 2022-03-08 DIAGNOSIS — I10 Essential (primary) hypertension: Secondary | ICD-10-CM | POA: Insufficient documentation

## 2022-03-08 LAB — CBC
HCT: 40.5 % (ref 36.0–46.0)
Hemoglobin: 13.1 g/dL (ref 12.0–15.0)
MCH: 28 pg (ref 26.0–34.0)
MCHC: 32.3 g/dL (ref 30.0–36.0)
MCV: 86.5 fL (ref 80.0–100.0)
Platelets: 261 10*3/uL (ref 150–400)
RBC: 4.68 MIL/uL (ref 3.87–5.11)
RDW: 14.7 % (ref 11.5–15.5)
WBC: 9.2 10*3/uL (ref 4.0–10.5)
nRBC: 0 % (ref 0.0–0.2)

## 2022-03-08 LAB — BASIC METABOLIC PANEL WITH GFR
Anion gap: 7 (ref 5–15)
BUN: 11 mg/dL (ref 6–20)
CO2: 27 mmol/L (ref 22–32)
Calcium: 8.9 mg/dL (ref 8.9–10.3)
Chloride: 106 mmol/L (ref 98–111)
Creatinine, Ser: 0.8 mg/dL (ref 0.44–1.00)
GFR, Estimated: >60 mL/min
Glucose, Bld: 92 mg/dL (ref 70–99)
Potassium: 3.6 mmol/L (ref 3.5–5.1)
Sodium: 140 mmol/L (ref 135–145)

## 2022-03-08 LAB — GLUCOSE, CAPILLARY: Glucose-Capillary: 92 mg/dL (ref 70–99)

## 2022-03-08 LAB — HEMOGLOBIN A1C
Hgb A1c MFr Bld: 5.5 % (ref 4.8–5.6)
Mean Plasma Glucose: 111.15 mg/dL

## 2022-03-11 ENCOUNTER — Encounter (HOSPITAL_BASED_OUTPATIENT_CLINIC_OR_DEPARTMENT_OTHER): Payer: Self-pay | Admitting: *Deleted

## 2022-03-15 ENCOUNTER — Ambulatory Visit: Payer: Self-pay | Admitting: General Surgery

## 2022-03-18 ENCOUNTER — Encounter (HOSPITAL_BASED_OUTPATIENT_CLINIC_OR_DEPARTMENT_OTHER): Payer: Self-pay | Admitting: Cardiovascular Disease

## 2022-03-18 ENCOUNTER — Ambulatory Visit (HOSPITAL_BASED_OUTPATIENT_CLINIC_OR_DEPARTMENT_OTHER): Payer: 59 | Admitting: Cardiovascular Disease

## 2022-03-18 ENCOUNTER — Other Ambulatory Visit (HOSPITAL_COMMUNITY): Payer: Self-pay

## 2022-03-18 DIAGNOSIS — I10 Essential (primary) hypertension: Secondary | ICD-10-CM | POA: Diagnosis not present

## 2022-03-18 DIAGNOSIS — E7800 Pure hypercholesterolemia, unspecified: Secondary | ICD-10-CM

## 2022-03-18 DIAGNOSIS — R002 Palpitations: Secondary | ICD-10-CM | POA: Diagnosis not present

## 2022-03-18 NOTE — Anesthesia Preprocedure Evaluation (Signed)
Anesthesia Evaluation  Patient identified by MRN, date of birth, ID band Patient awake    Reviewed: Allergy & Precautions, NPO status , Patient's Chart, lab work & pertinent test results, reviewed documented beta blocker date and time   History of Anesthesia Complications Negative for: history of anesthetic complications  Airway Mallampati: I  TM Distance: >3 FB Neck ROM: Full    Dental  (+) Dental Advisory Given   Pulmonary asthma ,    breath sounds clear to auscultation       Cardiovascular hypertension, Pt. on medications and Pt. on home beta blockers (-) angina Rhythm:Regular Rate:Normal  '21 ECHO: EF 60-65%, normal LVF, no significant valvular abnormalities   Neuro/Psych  Headaches, PSYCHIATRIC DISORDERS (ADHD) Anxiety Depression    GI/Hepatic Neg liver ROS, Possible Crohn's   Endo/Other  Morbid obesityPCOS  Renal/GU negative Renal ROS     Musculoskeletal   Abdominal (+) + obese,   Peds  Hematology negative hematology ROS (+)   Anesthesia Other Findings   Reproductive/Obstetrics                            Anesthesia Physical Anesthesia Plan  ASA: 3  Anesthesia Plan: General   Post-op Pain Management: Tylenol PO (pre-op)*   Induction: Intravenous  PONV Risk Score and Plan: 3 and Ondansetron, Dexamethasone, Aprepitant and Scopolamine patch - Pre-op  Airway Management Planned: Oral ETT  Additional Equipment: None  Intra-op Plan:   Post-operative Plan: Extubation in OR  Informed Consent: I have reviewed the patients History and Physical, chart, labs and discussed the procedure including the risks, benefits and alternatives for the proposed anesthesia with the patient or authorized representative who has indicated his/her understanding and acceptance.     Dental advisory given  Plan Discussed with: CRNA and Surgeon  Anesthesia Plan Comments:        Anesthesia Quick  Evaluation

## 2022-03-18 NOTE — Assessment & Plan Note (Signed)
Lipids are well-controlled with diet and exercise.

## 2022-03-18 NOTE — Progress Notes (Signed)
Cardiology Office Note   Date:  03/18/2022   ID:  Madison Robertson 20-Jul-1991, MRN [id number]  PCP:  Madison Robertson, Madison Robertson  Cardiologist:   Madison Latch, MD   No chief complaint on file.    History of Present Illness: Madison Robertson is a 31 y.o. female with hypertension, hyperlipidemia, prediabetes, asthma, anxiety and edema here for follow-up. She was initially seen 01/2020 for the evaluation of tachycardia and family history of CAD.  She saw Madison Robertson on 10/2019 and noted that her heart rate was sporadically over 120 at rest.  She received notifications from her [place] watch.  She note that her heart rate has been fast since her teens.  Her pediatrician noted that it was due to her ADHD medication.  Her heart rate came down the 90s when it was held.  She started back on the medication and her rate has been consistently in the 100s since then. She was minimally bothered by the symptoms. She had an echocardiogram 01/2020 that revealed LVEF 60 to 65% with normal diastolic function. She also reported lower extremity edema but there is no evidence of volume overload on her echo. She reported some bradycardia and dizziness after her COVID-19 vaccine.  She had sinus tachycardia thought to be due to her ADHD meds. She was started on metoprolol for her symptoms.  She developed a cough after starting lisinopril so was stopped and switched to 50 mg losartan by her PCP. At the last visit, her blood pressure was uncontrolled so losartan was switched to valsartan. She followed up with Madison Montana, NP, and her blood pressure was better controlled but her diastolics were in the 093O. Chlorthalidone was added.   Today, she states things are going much better. She reports having moments where her BP is elevated at work, and has gotten as high as 671 systolic. At home, her blood pressure is well-controlled, and she normally has numbers around 118/72. In regards to exercise, she walks at work during her  lunch break, where she is able to do two laps around her building. She reports that she feels good while walking and that the highest her BP has gotten during this exercise is 245 systolic. She has been watching her salt intake, and has been on a modified liquid diet for the past few weeks. She denies any palpitations, chest pain, shortness of breath, or peripheral edema. No lightheadedness, headaches, syncope, orthopnea, or PND.   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   Depression    Edema, lower extremity    Essential hypertension 06/29/2018   Lisinopril-hydrochlorothiazide 20-25 mg   Heart murmur    Hyperhidrosis 05/29/2021   Hyperlipidemia    Has never been on medication for this   Hypertension    Lisinopril-HCTZ   IBS (irritable bowel syndrome)    Intertrigo 05/29/2021   Intrinsic atopic dermatitis 05/28/2021   Lactose intolerance    Migraines    well-controlled when blood pressure is controlled   Morbid obesity ([place]) 02/07/2020   Palpitations 02/07/2020   PCOS (polycystic ovarian syndrome)    10 years; was on metformin for 2 months but didn't like the way it made her feel; has never been on spironolactone   Pre-diabetes    Hemoglobin  A1c 6.0 in September 2019   Pure hypercholesterolemia 02/07/2020    Past Surgical History:  Procedure Laterality Date   COLONOSCOPY  2022   ESOPHAGOGASTRODUODENOSCOPY ENDOSCOPY     WISDOM TOOTH EXTRACTION     Feb 2017     Current Outpatient Medications  Medication Sig Dispense Refill   aluminum chloride (DRYSOL) 20 % external solution Apply to underarms daily to every other day under occlusion as tolerated (Patient taking differently: Apply 1 Application topically daily as needed (sweating).) 60 mL 5   Biotin 5000 MCG TABS Take 5,000 mcg by mouth daily.      buPROPion (WELLBUTRIN XL) 300 MG 24 hr tablet Take 1 tablet (300 mg total) by mouth daily. 90 tablet 1   chlorthalidone (HYGROTON) 25 MG tablet Take 0.5 tablets (12.5 mg total) by mouth daily. 45 tablet 1   cholecalciferol (VITAMIN D) 25 MCG (1000 UNIT) tablet Take 1 tablet by mouth daily 100 tablet 4   clobetasol ointment (TEMOVATE) 0.05 % Apply to the affected area of scalp 4-5 times weekly. (Patient taking differently: 1 Application daily as needed (eczema).) 60 g 3   cyanocobalamin (VITAMIN B12) 1000 MCG tablet Take 1,000 mcg by mouth daily.     Dupilumab (DUPIXENT) 300 MG/2ML SOPN Inject 2 mLs (300 mg total) into the skin every 14 days. 4 mL 11   hydrocortisone 2.5 % cream Apply to the face twice daily. (Patient taking differently: 1 Application 2 (two) times daily as needed (eczema).) 453.6 g 2   linaclotide (LINZESS) 72 MCG capsule Take 1 capsule by mouth daily. (Patient taking differently: Take 72 mcg by mouth as needed.) 30 capsule 5   Methylphenidate HCl ER, PM, (JORNAY PM) 60 MG CP24 Take 1 capsule by mouth at bedtime. 30 capsule 0   metoprolol succinate (TOPROL XL) 25 MG 24 hr tablet Take 1.5 tablets (37.5 mg total) by mouth daily. 135 tablet 3   norgestimate-ethinyl estradiol (VYLIBRA) 0.25-35 MG-MCG tablet TAKE 1 TABLET BY MOUTH DAILY. 84 tablet 3   nystatin (MYCOSTATIN/NYSTOP) powder Apply under the breasts daily for 10 days as needed for flares of intertrigo. 60 g 5   tacrolimus (PROTOPIC) 0.1 % ointment Apply once daily to the affected areas. (Patient taking differently: Apply 1 Application topically daily as needed (eczema).) 60 g 3   triamcinolone ointment (KENALOG) 0.1 % Apply to the itchy areas twice daily. NEVER to the face. (Patient taking differently: Apply 1 Application topically 2 (two) times daily as needed (eczema).) 453.6 g 3   valsartan (DIOVAN) 320 MG tablet Take 1 tablet (320 mg total) by mouth daily. 90 tablet 3   Current Facility-Administered Medications  Medication  Dose Route Frequency Provider Last Rate Last Admin   0.9 %  sodium chloride infusion  500 mL Intravenous Continuous Danis, Kirke Corin, MD        Allergies:   Elderberry, Amlodipine, Hydrocodone, and Lisinopril    Social History:  The patient  reports that she has never smoked. She has never used smokeless tobacco. She reports current alcohol use. She reports that she does not use drugs.   Family History:  The patient's family history includes Anxiety disorder in her mother; Atrial fibrillation in her father; Breast cancer in an other family member; Colon cancer in her maternal grandfather; Colon polyps in her mother; Congestive Heart Failure in her father; Depression in her mother; Diabetes in her father and mother; Esophageal cancer in her maternal grandmother and maternal uncle; Heart attack in her father;  Heart disease in her father; Heart failure in her mother; High Cholesterol in her mother; Hypertension in her father, maternal grandfather, maternal grandmother, mother, paternal grandfather, and paternal grandmother; Obesity in her mother; Ovarian cancer in an other family member; Prostate cancer in her paternal grandfather; Uterine cancer in an other family member.    ROS:   Please see the history of present illness.   All other systems are reviewed and negative.    PHYSICAL EXAM: VS:  BP 112/84 (BP Location: Right Arm, Patient Position: Sitting, Cuff Size: Large)   Pulse 86   Ht '5\' 7"'$  (1.702 m)   Wt 294 lb 6.4 oz (133.5 kg)   LMP 03/02/2022   SpO2 99%   BMI 46.11 kg/m  , BMI Body mass index is 46.11 kg/m. GENERAL:  Well appearing HEENT:  Pupils equal round and reactive, fundi not visualized, oral mucosa unremarkable NECK:  No jugular venous distention, waveform within normal limits, carotid upstroke brisk and symmetric, no bruits LUNGS:  Clear to auscultation bilaterally HEART:  RRR.  PMI not displaced or sustained,S1 and S2 within normal limits, no S3, no S4, no clicks, no  rubs, no murmurs ABD:  Flat, positive bowel sounds normal in frequency in pitch, no bruits, no rebound, no guarding, no midline pulsatile mass, no hepatomegaly, no splenomegaly EXT:  2 plus pulses throughout, trace edema, no cyanosis no clubbing SKIN:  No rashes no nodules NEURO:  Cranial nerves II through XII grossly intact, motor grossly intact throughout PSYCH:  Cognitively intact, oriented to person place and time   EKG:  EKG is personally reviewed. 03/18/22: EKG was not ordered.   10/31/2021:  sinus tachycardia rate-111 bpm The ekg ordered 02/07/20 demonstrates sinus tachycardia.  Rate 109 bpm.   Other studies reviewed:  Echo 02/25/2020:   1. Normal coronary artery origins.   2. Left ventricular ejection fraction, by estimation, is 60 to 65%. The  left ventricle has normal function. The left ventricle has no regional  wall motion abnormalities. Left ventricular diastolic parameters were  normal.   3. Right ventricular systolic function is normal. The right ventricular  size is normal.   4. The mitral valve is normal in structure. Trivial mitral valve  regurgitation. No evidence of mitral stenosis.   5. The aortic valve is normal in structure. Aortic valve regurgitation is  not visualized. No aortic stenosis is present.   6. The inferior vena cava is normal in size with greater than 50%  respiratory variability, suggesting right atrial pressure of 3 mmHg.   Recent Labs: 03/08/2022: BUN 11; Creatinine, Ser 0.80; Hemoglobin 13.1; Platelets 261; Potassium 3.6; Sodium 140    Lipid Panel    Component Value Date/Time   CHOL 166 02/21/2021 1114   CHOL 177 01/25/2019 1145   TRIG 91.0 02/21/2021 1114   HDL 47.70 02/21/2021 1114   HDL 54 01/25/2019 1145   CHOLHDL 3 02/21/2021 1114   VLDL 18.2 02/21/2021 1114   LDLCALC 100 (H) 02/21/2021 1114   LDLCALC 109 (H) 01/25/2019 1145      Wt Readings from Last 3 Encounters:  03/18/22 294 lb 6.4 oz (133.5 kg)  03/08/22 295 lb (133.8 kg)   02/25/22 (!) 302 lb 8 oz (137.2 kg)      ASSESSMENT AND PLAN:  Essential hypertension Blood pressure has been much better controlled.  She is tolerating valsartan, chlorthalidone, and metoprolol. She is going for gastric sleeve operations tomorrow and may need her medications held or reduced post-operatively.  Palpitations Much  better controlled on metoprolol.  Continue current dose.  Pure hypercholesterolemia Lipids are well-controlled with diet and exercise.      Current medicines are reviewed at length with the patient today.  The patient does not have concerns regarding medicines.  The following changes have been made:Change losartan to valsartan and lasix given prn  Labs/ tests ordered today include:   No orders of the defined types were placed in this encounter.    Disposition: FU with [place] [facility] C. Oval Linsey, MD, [facility] in 4 months.    I,Breanna Adamick,acting as a scribe for Madison Latch, MD.,have documented all relevant documentation on the behalf of Madison Latch, MD,as directed by  Madison Latch, MD while in the presence of Madison Latch, MD.   I, [place] Oval Linsey, MD have reviewed all documentation for this visit.  The documentation of the exam, diagnosis, procedures, and orders on 03/18/2022 are all accurate and complete.   Signed, Samanth Mirkin C. Oval Linsey, MD, [facility]  03/18/2022 8:38 AM    [place]

## 2022-03-18 NOTE — Assessment & Plan Note (Signed)
Blood pressure has been much better controlled.  She is tolerating valsartan, chlorthalidone, and metoprolol. She is going for gastric sleeve operations tomorrow and may need her medications held or reduced post-operatively.

## 2022-03-18 NOTE — Assessment & Plan Note (Signed)
Much better controlled on metoprolol.  Continue current dose.

## 2022-03-18 NOTE — Patient Instructions (Signed)
Medication Instructions:  Your physician recommends that you continue on your current medications as directed. Please refer to the Current Medication list given to you today.   *If you need a refill on your cardiac medications before your next appointment, please call your pharmacy*  Lab Work: NONE  Testing/Procedures: NONE  Follow-Up: At [facility], you and your health needs are our priority.  As part of our continuing mission to provide you with exceptional heart care, we have created designated Provider Care Teams.  These Care Teams include your primary Cardiologist (physician) and Advanced Practice Providers (APPs -  Physician Assistants and Nurse Practitioners) who all work together to provide you with the care you need, when you need it.  We recommend signing up for the patient portal called "MyChart".  Sign up information is provided on this After Visit Summary.  MyChart is used to connect with patients for Virtual Visits (Telemedicine).  Patients are able to view lab/test results, encounter notes, upcoming appointments, etc.  Non-urgent messages can be sent to your provider as well.   To learn more about what you can do with MyChart, go to [email or web address].    Your next appointment:   4 month(s)  The format for your next appointment:   In Person  Provider:   Skeet Latch, MD{

## 2022-03-19 ENCOUNTER — Other Ambulatory Visit: Payer: Self-pay

## 2022-03-19 ENCOUNTER — Encounter (HOSPITAL_COMMUNITY): Payer: Self-pay | Admitting: General Surgery

## 2022-03-19 ENCOUNTER — Other Ambulatory Visit (HOSPITAL_COMMUNITY): Payer: Self-pay

## 2022-03-19 ENCOUNTER — Inpatient Hospital Stay (HOSPITAL_COMMUNITY): Payer: 59 | Admitting: Physician Assistant

## 2022-03-19 ENCOUNTER — Encounter (HOSPITAL_COMMUNITY)
Admission: RE | Disposition: A | Discharge status: Home / Self Care | Payer: Self-pay | Source: Ambulatory Visit | Attending: General Surgery

## 2022-03-19 ENCOUNTER — Inpatient Hospital Stay (HOSPITAL_COMMUNITY)
Admission: RE | Admit: 2022-03-19 | Discharge: 2022-03-20 | DRG: 621 | Disposition: A | Discharge status: Home / Self Care | Payer: 59 | Source: Ambulatory Visit | Attending: General Surgery | Admitting: General Surgery

## 2022-03-19 ENCOUNTER — Inpatient Hospital Stay (HOSPITAL_COMMUNITY): Payer: 59 | Admitting: Anesthesiology

## 2022-03-19 DIAGNOSIS — Z6841 Body Mass Index (BMI) 40.0 and over, adult: Secondary | ICD-10-CM | POA: Diagnosis not present

## 2022-03-19 DIAGNOSIS — Z800 Family history of malignant neoplasm of digestive organs: Secondary | ICD-10-CM | POA: Diagnosis not present

## 2022-03-19 DIAGNOSIS — E282 Polycystic ovarian syndrome: Secondary | ICD-10-CM

## 2022-03-19 DIAGNOSIS — Z803 Family history of malignant neoplasm of breast: Secondary | ICD-10-CM | POA: Diagnosis not present

## 2022-03-19 DIAGNOSIS — I10 Essential (primary) hypertension: Secondary | ICD-10-CM | POA: Diagnosis present

## 2022-03-19 DIAGNOSIS — E669 Obesity, unspecified: Secondary | ICD-10-CM | POA: Diagnosis not present

## 2022-03-19 DIAGNOSIS — E739 Lactose intolerance, unspecified: Secondary | ICD-10-CM | POA: Diagnosis not present

## 2022-03-19 DIAGNOSIS — Z823 Family history of stroke: Secondary | ICD-10-CM | POA: Diagnosis not present

## 2022-03-19 DIAGNOSIS — R7303 Prediabetes: Secondary | ICD-10-CM | POA: Diagnosis present

## 2022-03-19 DIAGNOSIS — Z833 Family history of diabetes mellitus: Secondary | ICD-10-CM

## 2022-03-19 DIAGNOSIS — E7800 Pure hypercholesterolemia, unspecified: Secondary | ICD-10-CM | POA: Diagnosis present

## 2022-03-19 DIAGNOSIS — Z8249 Family history of ischemic heart disease and other diseases of the circulatory system: Secondary | ICD-10-CM

## 2022-03-19 DIAGNOSIS — J45909 Unspecified asthma, uncomplicated: Secondary | ICD-10-CM | POA: Diagnosis not present

## 2022-03-19 DIAGNOSIS — Z79899 Other long term (current) drug therapy: Secondary | ICD-10-CM

## 2022-03-19 HISTORY — PX: UPPER GI ENDOSCOPY: SHX6162

## 2022-03-19 LAB — CBC WITH DIFFERENTIAL/PLATELET
Abs Immature Granulocytes: 0.02 10*3/uL (ref 0.00–0.07)
Basophils Absolute: 0.1 10*3/uL (ref 0.0–0.1)
Basophils Relative: 1 %
Eosinophils Absolute: 0.1 10*3/uL (ref 0.0–0.5)
Eosinophils Relative: 1 %
HCT: 41.7 % (ref 36.0–46.0)
Hemoglobin: 13.6 g/dL (ref 12.0–15.0)
Immature Granulocytes: 0 %
Lymphocytes Relative: 26 %
Lymphs Abs: 2.3 10*3/uL (ref 0.7–4.0)
MCH: 28.3 pg (ref 26.0–34.0)
MCHC: 32.6 g/dL (ref 30.0–36.0)
MCV: 86.7 fL (ref 80.0–100.0)
Monocytes Absolute: 0.6 10*3/uL (ref 0.1–1.0)
Monocytes Relative: 6 %
Neutro Abs: 5.9 10*3/uL (ref 1.7–7.7)
Neutrophils Relative %: 66 %
Platelets: 196 10*3/uL (ref 150–400)
RBC: 4.81 MIL/uL (ref 3.87–5.11)
RDW: 14.7 % (ref 11.5–15.5)
WBC: 8.9 10*3/uL (ref 4.0–10.5)
nRBC: 0 % (ref 0.0–0.2)

## 2022-03-19 LAB — TYPE AND SCREEN
ABO/RH(D): O POS
Antibody Screen: NEGATIVE

## 2022-03-19 LAB — COMPREHENSIVE METABOLIC PANEL
ALT: 31 U/L (ref 0–44)
AST: 28 U/L (ref 15–41)
Albumin: 4.1 g/dL (ref 3.5–5.0)
Alkaline Phosphatase: 69 U/L (ref 38–126)
Anion gap: 6 (ref 5–15)
BUN: 12 mg/dL (ref 6–20)
CO2: 26 mmol/L (ref 22–32)
Calcium: 9.2 mg/dL (ref 8.9–10.3)
Chloride: 108 mmol/L (ref 98–111)
Creatinine, Ser: 1.07 mg/dL — ABNORMAL HIGH (ref 0.44–1.00)
GFR, Estimated: >60 mL/min (ref >60)
Glucose, Bld: 87 mg/dL (ref 70–99)
Potassium: 3.6 mmol/L (ref 3.5–5.1)
Sodium: 140 mmol/L (ref 135–145)
Total Bilirubin: 0.3 mg/dL (ref 0.3–1.2)
Total Protein: 8 g/dL (ref 6.5–8.1)

## 2022-03-19 LAB — POCT PREGNANCY, URINE: Preg Test, Ur: NEGATIVE

## 2022-03-19 LAB — HEMOGLOBIN AND HEMATOCRIT, BLOOD
HCT: 40.3 % (ref 36.0–46.0)
Hemoglobin: 13.2 g/dL (ref 12.0–15.0)

## 2022-03-19 LAB — GLUCOSE, CAPILLARY: Glucose-Capillary: 84 mg/dL (ref 70–99)

## 2022-03-19 LAB — ABO/RH: ABO/RH(D): O POS

## 2022-03-19 SURGERY — XI ROBOTIC GASTRIC SLEEVE RESECTION
Anesthesia: General

## 2022-03-19 MED ORDER — 0.9 % SODIUM CHLORIDE (POUR BTL) OPTIME
TOPICAL | Status: DC | PRN
  Administered 2022-03-19: 1000 mL

## 2022-03-19 MED ORDER — ONDANSETRON HCL 4 MG/2ML IJ SOLN
INTRAMUSCULAR | Status: AC
Start: 2022-03-19 — End: ?
  Filled 2022-03-19: qty 2

## 2022-03-19 MED ORDER — PHENYLEPHRINE HCL (PRESSORS) 10 MG/ML IV SOLN
INTRAVENOUS | Status: AC
  Filled 2022-03-19: qty 1

## 2022-03-19 MED ORDER — MIDAZOLAM HCL 2 MG/2ML IJ SOLN: 0.5000 mg | Freq: Once | INTRAMUSCULAR | Status: DC | PRN

## 2022-03-19 MED ORDER — PROPOFOL 10 MG/ML IV BOLUS
INTRAVENOUS | Status: DC | PRN
  Administered 2022-03-19: 170 mg via INTRAVENOUS

## 2022-03-19 MED ORDER — BUPROPION HCL ER (XL) 300 MG PO TB24
300.0000 mg | ORAL_TABLET | Freq: Every day | ORAL | Status: DC
  Administered 2022-03-20: 300 mg via ORAL
  Filled 2022-03-19: qty 1

## 2022-03-19 MED ORDER — ACETAMINOPHEN 500 MG PO TABS: 1000.0000 mg | ORAL_TABLET | Freq: Once | ORAL | Status: AC

## 2022-03-19 MED ORDER — SCOPOLAMINE 1 MG/3DAYS TD PT72
1.0000 | MEDICATED_PATCH | TRANSDERMAL | Status: DC
  Administered 2022-03-19: 1.5 mg via TRANSDERMAL
  Filled 2022-03-19: qty 1

## 2022-03-19 MED ORDER — ONDANSETRON HCL 4 MG/2ML IJ SOLN
INTRAMUSCULAR | Status: DC | PRN
  Administered 2022-03-19: 4 mg via INTRAVENOUS

## 2022-03-19 MED ORDER — OXYCODONE HCL 5 MG/5ML PO SOLN: 5.0000 mg | Freq: Once | ORAL | Status: DC | PRN

## 2022-03-19 MED ORDER — BUPIVACAINE-EPINEPHRINE (PF) 0.5% -1:200000 IJ SOLN
INTRAMUSCULAR | Status: DC | PRN
  Administered 2022-03-19: 30 mL via PERINEURAL

## 2022-03-19 MED ORDER — DEXAMETHASONE SODIUM PHOSPHATE 10 MG/ML IJ SOLN
INTRAMUSCULAR | Status: AC
Start: 2022-03-19 — End: ?
  Filled 2022-03-19: qty 1

## 2022-03-19 MED ORDER — MIDAZOLAM HCL 2 MG/2ML IJ SOLN
INTRAMUSCULAR | Status: AC
Start: 2022-03-19 — End: ?
  Filled 2022-03-19: qty 2

## 2022-03-19 MED ORDER — ACETAMINOPHEN 500 MG PO TABS
1000.0000 mg | ORAL_TABLET | ORAL | Status: AC
  Administered 2022-03-19: 1000 mg via ORAL
  Filled 2022-03-19: qty 2

## 2022-03-19 MED ORDER — ACETAMINOPHEN 160 MG/5ML PO SOLN: 1000.0000 mg | Freq: Three times a day (TID) | ORAL | Status: DC

## 2022-03-19 MED ORDER — BUPIVACAINE LIPOSOME 1.3 % IJ SUSP
INTRAMUSCULAR | Status: AC
  Filled 2022-03-19: qty 20

## 2022-03-19 MED ORDER — ENSURE MAX PROTEIN PO LIQD
2.0000 [oz_av] | ORAL | Status: DC
  Administered 2022-03-20 (×4): 2 [oz_av] via ORAL

## 2022-03-19 MED ORDER — ORAL CARE MOUTH RINSE: 15.0000 mL | Freq: Once | OROMUCOSAL | Status: AC

## 2022-03-19 MED ORDER — ACETAMINOPHEN 500 MG PO TABS
1000.0000 mg | ORAL_TABLET | Freq: Three times a day (TID) | ORAL | Status: DC
  Administered 2022-03-19 – 2022-03-20 (×2): 1000 mg via ORAL
  Filled 2022-03-19 (×2): qty 2

## 2022-03-19 MED ORDER — HYDRALAZINE HCL 20 MG/ML IJ SOLN: 10.0000 mg | INTRAMUSCULAR | Status: DC | PRN

## 2022-03-19 MED ORDER — KETAMINE HCL 10 MG/ML IJ SOLN
INTRAMUSCULAR | Status: DC | PRN
  Administered 2022-03-19: 50 mg via INTRAVENOUS

## 2022-03-19 MED ORDER — IRBESARTAN 300 MG PO TABS
300.0000 mg | ORAL_TABLET | Freq: Every day | ORAL | Status: DC
  Administered 2022-03-19 – 2022-03-20 (×2): 300 mg via ORAL
  Filled 2022-03-19 (×2): qty 1

## 2022-03-19 MED ORDER — ROCURONIUM BROMIDE 10 MG/ML (PF) SYRINGE
PREFILLED_SYRINGE | INTRAVENOUS | Status: AC
Start: 2022-03-19 — End: ?
  Filled 2022-03-19: qty 10

## 2022-03-19 MED ORDER — PHENYLEPHRINE HCL-NACL 20-0.9 MG/250ML-% IV SOLN
INTRAVENOUS | Status: DC | PRN
  Administered 2022-03-19: 50 ug/min via INTRAVENOUS

## 2022-03-19 MED ORDER — BUPIVACAINE-EPINEPHRINE (PF) 0.5% -1:200000 IJ SOLN
INTRAMUSCULAR | Status: AC
  Filled 2022-03-19: qty 30

## 2022-03-19 MED ORDER — HYDROMORPHONE HCL 1 MG/ML IJ SOLN
INTRAMUSCULAR | Status: AC
  Filled 2022-03-19: qty 1

## 2022-03-19 MED ORDER — ONDANSETRON HCL 4 MG/2ML IJ SOLN: 4.0000 mg | INTRAMUSCULAR | Status: DC | PRN

## 2022-03-19 MED ORDER — SCOPOLAMINE 1 MG/3DAYS TD PT72: 1.0000 | MEDICATED_PATCH | TRANSDERMAL | Status: DC

## 2022-03-19 MED ORDER — HYDROMORPHONE HCL 1 MG/ML IJ SOLN
0.2500 mg | INTRAMUSCULAR | Status: DC | PRN
  Administered 2022-03-19 (×2): 0.5 mg via INTRAVENOUS

## 2022-03-19 MED ORDER — PHENYLEPHRINE 80 MCG/ML (10ML) SYRINGE FOR IV PUSH (FOR BLOOD PRESSURE SUPPORT)
PREFILLED_SYRINGE | INTRAVENOUS | Status: AC
  Filled 2022-03-19: qty 10

## 2022-03-19 MED ORDER — LIDOCAINE 2% (20 MG/ML) 5 ML SYRINGE
INTRAMUSCULAR | Status: DC | PRN
  Administered 2022-03-19: 1.5 mg/kg/h via INTRAVENOUS
  Administered 2022-03-19: 100 mg via INTRAVENOUS

## 2022-03-19 MED ORDER — PROMETHAZINE HCL 25 MG/ML IJ SOLN
6.2500 mg | INTRAMUSCULAR | Status: DC | PRN
  Administered 2022-03-19: 12.5 mg via INTRAVENOUS

## 2022-03-19 MED ORDER — SUGAMMADEX SODIUM 500 MG/5ML IV SOLN
INTRAVENOUS | Status: DC | PRN
  Administered 2022-03-19: 300 mg via INTRAVENOUS

## 2022-03-19 MED ORDER — PROPOFOL 10 MG/ML IV BOLUS
INTRAVENOUS | Status: AC
  Filled 2022-03-19: qty 20

## 2022-03-19 MED ORDER — BUPIVACAINE LIPOSOME 1.3 % IJ SUSP: 20.0000 mL | Freq: Once | INTRAMUSCULAR | Status: DC

## 2022-03-19 MED ORDER — PHENYLEPHRINE 80 MCG/ML (10ML) SYRINGE FOR IV PUSH (FOR BLOOD PRESSURE SUPPORT)
PREFILLED_SYRINGE | INTRAVENOUS | Status: DC | PRN
  Administered 2022-03-19: 160 ug via INTRAVENOUS

## 2022-03-19 MED ORDER — LACTATED RINGERS IR SOLN
Status: DC | PRN
  Administered 2022-03-19: 1000 mL

## 2022-03-19 MED ORDER — HEPARIN SODIUM (PORCINE) 5000 UNIT/ML IJ SOLN
5000.0000 [IU] | INTRAMUSCULAR | Status: AC
  Administered 2022-03-19: 5000 [IU] via SUBCUTANEOUS
  Filled 2022-03-19: qty 1

## 2022-03-19 MED ORDER — FENTANYL CITRATE (PF) 250 MCG/5ML IJ SOLN
INTRAMUSCULAR | Status: AC
  Filled 2022-03-19: qty 5

## 2022-03-19 MED ORDER — ENOXAPARIN SODIUM 30 MG/0.3ML IJ SOSY
30.0000 mg | PREFILLED_SYRINGE | Freq: Two times a day (BID) | INTRAMUSCULAR | Status: DC
  Administered 2022-03-19 – 2022-03-20 (×2): 30 mg via SUBCUTANEOUS
  Filled 2022-03-19 (×2): qty 0.3

## 2022-03-19 MED ORDER — SODIUM CHLORIDE 0.9 % IV SOLN
2.0000 g | INTRAVENOUS | Status: AC
  Administered 2022-03-19: 2 g via INTRAVENOUS
  Filled 2022-03-19: qty 2

## 2022-03-19 MED ORDER — OXYCODONE HCL 5 MG/5ML PO SOLN
5.0000 mg | Freq: Four times a day (QID) | ORAL | Status: DC | PRN
  Administered 2022-03-19: 5 mg via ORAL
  Filled 2022-03-19: qty 5

## 2022-03-19 MED ORDER — SIMETHICONE 80 MG PO CHEW: 80.0000 mg | CHEWABLE_TABLET | Freq: Four times a day (QID) | ORAL | Status: DC | PRN

## 2022-03-19 MED ORDER — PROMETHAZINE HCL 25 MG/ML IJ SOLN
INTRAMUSCULAR | Status: AC
  Filled 2022-03-19: qty 1

## 2022-03-19 MED ORDER — KETAMINE HCL 10 MG/ML IJ SOLN
INTRAMUSCULAR | Status: AC
  Filled 2022-03-19: qty 1

## 2022-03-19 MED ORDER — CHLORHEXIDINE GLUCONATE 0.12 % MT SOLN
15.0000 mL | Freq: Once | OROMUCOSAL | Status: AC
  Administered 2022-03-19: 15 mL via OROMUCOSAL

## 2022-03-19 MED ORDER — DEXAMETHASONE SODIUM PHOSPHATE 10 MG/ML IJ SOLN
INTRAMUSCULAR | Status: DC | PRN
  Administered 2022-03-19: 10 mg via INTRAVENOUS

## 2022-03-19 MED ORDER — FENTANYL CITRATE (PF) 250 MCG/5ML IJ SOLN
INTRAMUSCULAR | Status: DC | PRN
  Administered 2022-03-19: 100 ug via INTRAVENOUS

## 2022-03-19 MED ORDER — FAMOTIDINE IN NACL 20-0.9 MG/50ML-% IV SOLN
20.0000 mg | Freq: Two times a day (BID) | INTRAVENOUS | Status: DC
  Administered 2022-03-19 – 2022-03-20 (×2): 20 mg via INTRAVENOUS
  Filled 2022-03-19 (×2): qty 50

## 2022-03-19 MED ORDER — SUGAMMADEX SODIUM 500 MG/5ML IV SOLN
INTRAVENOUS | Status: AC
  Filled 2022-03-19: qty 5

## 2022-03-19 MED ORDER — ROCURONIUM BROMIDE 10 MG/ML (PF) SYRINGE
PREFILLED_SYRINGE | INTRAVENOUS | Status: DC | PRN
  Administered 2022-03-19: 100 mg via INTRAVENOUS

## 2022-03-19 MED ORDER — MORPHINE SULFATE (PF) 2 MG/ML IV SOLN
1.0000 mg | INTRAVENOUS | Status: DC | PRN
  Administered 2022-03-19: 2 mg via INTRAVENOUS
  Filled 2022-03-19: qty 1

## 2022-03-19 MED ORDER — LACTATED RINGERS IV SOLN: INTRAVENOUS | Status: DC

## 2022-03-19 MED ORDER — LIDOCAINE HCL 2 % IJ SOLN
INTRAMUSCULAR | Status: AC
  Filled 2022-03-19: qty 20

## 2022-03-19 MED ORDER — CHLORHEXIDINE GLUCONATE CLOTH 2 % EX PADS: 6.0000 | MEDICATED_PAD | Freq: Once | CUTANEOUS | Status: DC

## 2022-03-19 MED ORDER — LIDOCAINE 2% (20 MG/ML) 5 ML SYRINGE
INTRAMUSCULAR | Status: AC
  Filled 2022-03-19: qty 5

## 2022-03-19 MED ORDER — MIDAZOLAM HCL 2 MG/2ML IJ SOLN
INTRAMUSCULAR | Status: DC | PRN
  Administered 2022-03-19: 2 mg via INTRAVENOUS

## 2022-03-19 MED ORDER — BUPIVACAINE LIPOSOME 1.3 % IJ SUSP
INTRAMUSCULAR | Status: DC | PRN
  Administered 2022-03-19: 20 mL

## 2022-03-19 MED ORDER — APREPITANT 40 MG PO CAPS
40.0000 mg | ORAL_CAPSULE | ORAL | Status: AC
  Administered 2022-03-19: 40 mg via ORAL
  Filled 2022-03-19: qty 1

## 2022-03-19 MED ORDER — DEXTROSE-NACL 5-0.45 % IV SOLN: INTRAVENOUS | Status: DC

## 2022-03-19 MED ORDER — OXYCODONE HCL 5 MG PO TABS: 5.0000 mg | ORAL_TABLET | Freq: Once | ORAL | Status: DC | PRN

## 2022-03-19 MED ORDER — DEXAMETHASONE SODIUM PHOSPHATE 4 MG/ML IJ SOLN: 4.0000 mg | INTRAMUSCULAR | Status: DC

## 2022-03-19 MED ORDER — MEPERIDINE HCL 50 MG/ML IJ SOLN: 6.2500 mg | INTRAMUSCULAR | Status: DC | PRN

## 2022-03-19 SURGICAL SUPPLY — 81 items
APPLIER CLIP 5 13 M/L LIGAMAX5 (MISCELLANEOUS)
APPLIER CLIP ROT 10 11.4 M/L (STAPLE)
BAG COUNTER SPONGE SURGICOUNT (BAG) ×1 IMPLANT
BAG LAPAROSCOPIC 12 15 PORT 16 (BASKET) IMPLANT
BAG RETRIEVAL 12/15 (BASKET) ×1
BENZOIN TINCTURE PRP APPL 2/3 (GAUZE/BANDAGES/DRESSINGS) ×1 IMPLANT
BLADE SURG SZ11 CARB STEEL (BLADE) ×1 IMPLANT
BNDG ADH 1X3 SHEER STRL LF (GAUZE/BANDAGES/DRESSINGS) ×6 IMPLANT
CANNULA REDUC XI 12-8 STAPL (CANNULA) ×1
CANNULA REDUCER 12-8 DVNC XI (CANNULA) ×1 IMPLANT
CHLORAPREP W/TINT 26 (MISCELLANEOUS) ×1 IMPLANT
CLIP APPLIE 5 13 M/L LIGAMAX5 (MISCELLANEOUS) IMPLANT
CLIP APPLIE ROT 10 11.4 M/L (STAPLE) IMPLANT
COVER SURGICAL LIGHT HANDLE (MISCELLANEOUS) ×1 IMPLANT
DERMABOND ADVANCED (GAUZE/BANDAGES/DRESSINGS) ×1
DERMABOND ADVANCED .7 DNX12 (GAUZE/BANDAGES/DRESSINGS) IMPLANT
DRAPE ARM DVNC X/XI (DISPOSABLE) ×4 IMPLANT
DRAPE COLUMN DVNC XI (DISPOSABLE) ×1 IMPLANT
DRAPE DA VINCI XI ARM (DISPOSABLE) ×4
DRAPE DA VINCI XI COLUMN (DISPOSABLE) ×1
DRAPE ORTHO SPLIT 77X108 STRL (DRAPES) ×1
DRAPE SURG ORHT 6 SPLT 77X108 (DRAPES) ×1 IMPLANT
ELECT REM PT RETURN 15FT ADLT (MISCELLANEOUS) ×1 IMPLANT
GAUZE 4X4 16PLY ~~LOC~~+RFID DBL (SPONGE) ×1 IMPLANT
GLOVE BIOGEL PI IND STRL 7.0 (GLOVE) ×2 IMPLANT
GLOVE BIOGEL PI INDICATOR 7.0 (GLOVE) ×2
GLOVE SURG SS PI 7.0 STRL IVOR (GLOVE) ×2 IMPLANT
GOWN STRL REUS W/ TWL LRG LVL3 (GOWN DISPOSABLE) ×2 IMPLANT
GOWN STRL REUS W/ TWL XL LVL3 (GOWN DISPOSABLE) IMPLANT
GOWN STRL REUS W/TWL LRG LVL3 (GOWN DISPOSABLE) ×2
GOWN STRL REUS W/TWL XL LVL3 (GOWN DISPOSABLE)
GRASPER SUT TROCAR 14GX15 (MISCELLANEOUS) ×1 IMPLANT
IRRIG SUCT STRYKERFLOW 2 WTIP (MISCELLANEOUS) ×1
IRRIGATION SUCT STRKRFLW 2 WTP (MISCELLANEOUS) ×1 IMPLANT
KIT BASIN OR (CUSTOM PROCEDURE TRAY) ×1 IMPLANT
KIT TURNOVER KIT A (KITS) IMPLANT
MARKER SKIN DUAL TIP RULER LAB (MISCELLANEOUS) ×1 IMPLANT
MAT PREVALON FULL STRYKER (MISCELLANEOUS) ×1 IMPLANT
NDL SPNL 22GX3.5 QUINCKE BK (NEEDLE) ×1 IMPLANT
NEEDLE SPNL 22GX3.5 QUINCKE BK (NEEDLE) ×1 IMPLANT
PACK CARDIOVASCULAR III (CUSTOM PROCEDURE TRAY) ×1 IMPLANT
POUCH RETRIEVAL ECOSAC 10 (ENDOMECHANICALS) IMPLANT
POUCH RETRIEVAL ECOSAC 10MM (ENDOMECHANICALS)
RELOAD STAPLE 60 2.5 WHT DVNC (STAPLE) ×1 IMPLANT
RELOAD STAPLE 60 3.5 BLU DVNC (STAPLE) ×1 IMPLANT
RELOAD STAPLE 60 4.3 GRN DVNC (STAPLE) IMPLANT
RELOAD STAPLER 2.5X60 WHT DVNC (STAPLE) ×5 IMPLANT
RELOAD STAPLER 3.5X60 BLU DVNC (STAPLE) ×1 IMPLANT
RELOAD STAPLER 4.3X60 GRN DVNC (STAPLE) IMPLANT
SCISSORS LAP 5X35 DISP (ENDOMECHANICALS) IMPLANT
SEAL CANN UNIV 5-8 DVNC XI (MISCELLANEOUS) ×3 IMPLANT
SEAL XI 5MM-8MM UNIVERSAL (MISCELLANEOUS) ×3
SEALER VESSEL DA VINCI XI (MISCELLANEOUS) ×1
SEALER VESSEL EXT DVNC XI (MISCELLANEOUS) ×1 IMPLANT
SET TUBE SMOKE EVAC HIGH FLOW (TUBING) ×1 IMPLANT
SLEEVE GASTRECTOMY 40FR VISIGI (MISCELLANEOUS) ×1 IMPLANT
SOL ANTI FOG 6CC (MISCELLANEOUS) ×1 IMPLANT
SOLUTION ANTI FOG 6CC (MISCELLANEOUS) ×1
SOLUTION ELECTROLUBE (MISCELLANEOUS) ×1 IMPLANT
SPIKE FLUID TRANSFER (MISCELLANEOUS) ×1 IMPLANT
STAPLER 60 DA VINCI SURE FORM (STAPLE) ×1
STAPLER 60 SUREFORM DVNC (STAPLE) ×1 IMPLANT
STAPLER CANNULA SEAL DVNC XI (STAPLE) ×1 IMPLANT
STAPLER CANNULA SEAL XI (STAPLE) ×1
STAPLER RELOAD 2.5X60 WHITE (STAPLE) ×5
STAPLER RELOAD 2.5X60 WHT DVNC (STAPLE) ×5
STAPLER RELOAD 3.5X60 BLU DVNC (STAPLE) ×1
STAPLER RELOAD 3.5X60 BLUE (STAPLE) ×1
STAPLER RELOAD 4.3X60 GREEN (STAPLE)
STAPLER RELOAD 4.3X60 GRN DVNC (STAPLE)
STRIP CLOSURE SKIN 1/2X4 (GAUZE/BANDAGES/DRESSINGS) ×1 IMPLANT
SUT ETHIBOND 0 36 GRN (SUTURE) IMPLANT
SUT MNCRL AB 4-0 PS2 18 (SUTURE) ×1 IMPLANT
SUT VIC AB 2-0 SH 27 (SUTURE)
SUT VIC AB 2-0 SH 27X BRD (SUTURE) IMPLANT
SUT VICRYL 0 TIES 12 18 (SUTURE) ×1 IMPLANT
SYR 20ML LL LF (SYRINGE) ×2 IMPLANT
TOWEL OR 17X26 10 PK STRL BLUE (TOWEL DISPOSABLE) ×1 IMPLANT
TOWEL OR NON WOVEN STRL DISP B (DISPOSABLE) ×1 IMPLANT
TRAY FOLEY MTR SLVR 16FR STAT (SET/KITS/TRAYS/PACK) IMPLANT
TROCAR Z-THREAD OPTICAL 5X100M (TROCAR) ×1 IMPLANT

## 2022-03-19 NOTE — Progress Notes (Signed)

## 2022-03-19 NOTE — Anesthesia Postprocedure Evaluation (Signed)
Anesthesia Post Note  Patient: Madison Robertson  Procedure(s) Performed: XI ROBOTIC SLEEVE GASTRECTOMY UPPER GI ENDOSCOPY     Patient location during evaluation: PACU Anesthesia Type: General Level of consciousness: awake and alert, patient cooperative and oriented Pain management: pain level controlled Vital Signs Assessment: post-procedure vital signs reviewed and stable Respiratory status: spontaneous breathing, nonlabored ventilation and respiratory function stable Cardiovascular status: blood pressure returned to baseline and stable Postop Assessment: no apparent nausea or vomiting Anesthetic complications: no   No notable events documented.  Last Vitals:  Vitals:   03/19/22 1130 03/19/22 1200  BP: (!) 127/91 (!) 139/96  Pulse: 73 77  Resp: 14 14  Temp:    SpO2: 98% 98%    Last Pain:  Vitals:   03/19/22 1200  TempSrc:   PainSc: 0-No pain                 Rae Plotner,E. Graylin Sperling

## 2022-03-19 NOTE — Consult Note (Signed)
   [facility] Inpatient Consult   03/19/2022  MAVI UN 09-06-1990 [id number]   [place] Organization [ACO] Patient: Madison Robertson  03/20/22 1036 am- Call placed to patient at bedside, HIPPA verified, explained reason for call and to anticipate post hospital follow up call. Patient verbalized understanding and was gracious. PCP was confirmed  [facility] Liaison coverage for remote review, patient at [facility].  [place] Provider:  Inda Robertson, [place], [place] at [place] for [employer] plan for disease management and community resource support.  Patient is currently fresh post op.       Plan: Continue to follow progress. Patient will be followed by [place] Coordinator for post hospital support.   For additional questions or referrals please contact:   Natividad Brood, RN BSN [facility] Liaison  [phone] business mobile phone Toll free office [phone]  Fax number: [phone] [email or web address]

## 2022-03-19 NOTE — Op Note (Signed)
Preop Diagnosis: Obesity Class III  Postop Diagnosis: same  Procedure performed: laparoscopic Sleeve Gastrectomy  Assitant: Kaylyn Lim  Indications:  The patient is a 31 y.o. year-old morbidly obese female who has been followed in the Bariatric Clinic as an outpatient. This patient was diagnosed with morbid obesity with a BMI of Body mass index is 46.11 kg/m. and significant co-morbidities including hypertension.  The patient was counseled extensively in the Bariatric Outpatient Clinic and after a thorough explanation of the risks and benefits of surgery (including death from complications, bowel leak, infection such as peritonitis and/or sepsis, internal hernia, bleeding, need for blood transfusion, bowel obstruction, organ failure, pulmonary embolus, deep venous thrombosis, wound infection, incisional hernia, skin breakdown, and others entailed on the consent form) and after a compliant diet and exercise program, the patient was scheduled for an elective laparoscopic sleeve gastrectomy.  Description of Operation:  Following informed consent, the patient was taken to the operating room and placed on the operating table in the supine position.  She had previously received prophylactic antibiotics and subcutaneous heparin for DVT prophylaxis in the pre-op holding area.  After induction of general endotracheal anesthesia by the anesthesiologist, the patient underwent placement of sequential compression devices and an oro-gastric tube.  A timeout was confirmed by the surgery and anesthesia teams.  The patient was adequately padded at all pressure points and placed on a footboard to prevent slippage from the OR table during extremes of position during surgery.  She underwent a routine sterile prep and drape of her entire abdomen.    Next, A transverse incision was made at the left mid abdomen area and a 84m optical viewing trocar was introduced into the peritoneal cavity. Pneumoperitoneum was applied  with a high flow and low pressure. A laparoscope was inserted to confirm placement. A extraperitoneal block was then placed at the lateral abdominal wall using exparel diluted with marcaine. 5 additional incisions were placed: 1 179mtrocar to the right of the midline. 1 additional 65m64mrocar in the left mid abdominal area, 1 65mm40mocar in the left lateral abdomen, 1 5mm 23mcar in the left lower quadrant subcostal area, and a Nathanson retractor was placed through a subxiphoid incision.  Next, a hole was created through the lesser omentum along the greater curve of the stomach to enter the lesser sac. The vessels along the greater omentum were  Then ligated and divided using the Harmonic scalpel moving towards the spleen and then short gastric vessels were ligated and divided in the same fashion to fully mobilize the fundus. The left crus was identified to ensure completion of the dissection. Next the antrum was measured and dissection continued inferiorly along the greater curve towards the pylorus and stopped 6cm from the pylorus.   A 40Fr ViSiGi dilator was placed into the esophgaus and along the lesser curve of the stomach and placed on suction. 1 60mm 46m load robot stapler(s)  followed by 6 60mm w565m load robotic stapler(s)were used to make the resection along the antrum being sure to stay well away from the angularis by angling the jaws of the stapler towards the greater curve and later completing the resection staying along the ViSiGi [place] the fundus was not retained by appropriately retracting it lateral. Air was inserted through the ViSiGi [place] leak test showing no bubbles and a neutral lie of the stomach.  Next I performed upper endoscopy.GEJ junction was identified at  42 cm from the lips.  No bubbles were seen  and the sleeve and antrum distended appropriately. The specimen was then placed in an endocatch bag and removed by the 64m port. The fascia of the 138mport was closed  with a 0 vicryl by suture passer. Hemostasis was ensured. Pneumoperitoneum was evacuated, all ports were removed and all incisions closed with 4-0 monocryl suture in subcuticular fashion. Steristrips and bandaids were put in place for dressing. The patient awoke from anesthesia and was brought to pacu in stable condition. All counts were correct.  Estimated blood loss: <3060mSpecimens:  Sleeve gastrectomy  Local Anesthesia: 50 ml Exparel:0.5% Marcaine mix  Post-Op Plan:       Pain Management: PO, prn      Antibiotics: Prophylactic      Anticoagulation: Prophylactic, Starting now      Post Op Studies/Consults: Not applicable      Intended Discharge: within 48h      Intended Outpatient Follow-Up: Two Week      Intended Outpatient Studies: Not Applicable      Other: Not Applicable  LukArta Brucensinger

## 2022-03-19 NOTE — Transfer of Care (Signed)
Immediate Anesthesia Transfer of Care Note  Patient: Madison Robertson  Procedure(s) Performed: XI ROBOTIC SLEEVE GASTRECTOMY UPPER GI ENDOSCOPY  Patient Location: PACU  Anesthesia Type:General  Level of Consciousness: awake and alert   Airway & Oxygen Therapy: Patient Spontanous Breathing and Patient connected to face mask oxygen  Post-op Assessment: Report given to RN and Post -op Vital signs reviewed and stable  Post vital signs: Reviewed and stable  Last Vitals:  Vitals Value Taken Time  BP    Temp    Pulse 86 03/19/22 0915  Resp    SpO2 100 % 03/19/22 0915  Vitals shown include unvalidated device data.  Last Pain:  Vitals:   03/19/22 3533  TempSrc:   PainSc: 0-No pain      Patients Stated Pain Goal: 2 (17/40/99 2780)  Complications: No notable events documented.

## 2022-03-19 NOTE — Anesthesia Procedure Notes (Signed)
Procedure Name: Intubation Date/Time: 03/19/2022 7:40 AM  Performed by: Sharlette Dense, CRNAPatient Re-evaluated:Patient Re-evaluated prior to induction Oxygen Delivery Method: Circle system utilized Preoxygenation: Pre-oxygenation with 100% oxygen Induction Type: IV induction Ventilation: Oral airway inserted - appropriate to patient size and Mask ventilation without difficulty Laryngoscope Size: Miller and 2 Grade View: Grade I Tube type: Oral Tube size: 7.5 mm Number of attempts: 1 Airway Equipment and Method: Stylet Placement Confirmation: ETT inserted through vocal cords under direct vision, positive ETCO2 and breath sounds checked- equal and bilateral Secured at: 22 cm Tube secured with: Tape Dental Injury: Teeth and Oropharynx as per pre-operative assessment

## 2022-03-19 NOTE — Progress Notes (Signed)
PHARMACY CONSULT FOR:  Risk Assessment for Post-Discharge VTE Following Bariatric Surgery  Post-Discharge VTE Risk Assessment: This patient's probability of 30-day post-discharge VTE is increased due to the factors marked: X Sleeve gastrectomy   Liver disorder (transplant, cirrhosis, or nonalcoholic steatohepatitis)   Hx of VTE   Hemorrhage requiring transfusion   GI perforation, leak, or obstruction   ====================================================    Female    Age >/=60 years    BMI >/=50 kg/m2    CHF    Dyspnea at Rest    Paraplegia  X  Non-gastric-band surgery    Operation Time >/=3 hr    Return to OR     Length of Stay >/= 3 d   Hypercoagulable condition   Significant venous stasis      Predicted probability of 30-day post-discharge VTE: 0.16%  Other patient-specific factors to consider: no  Recommendation for Discharge: No pharmacologic prophylaxis post-discharge  Madison Robertson is a 31 y.o. female who underwent laparoscopic sleeve gastrectomy 03/19/2022      Allergies  Allergen Reactions   Elderberry     Throat swelling   Amlodipine Other (See Comments)    edema   Hydrocodone Nausea And Vomiting   Lisinopril Cough    Patient Measurements: Height: '5\' 7"'$  (170.2 cm) Weight: 133.5 kg (294 lb 6.4 oz) IBW/kg (Calculated) : 61.6 Body mass index is 46.11 kg/m.  Recent Labs    03/19/22 0624  WBC 8.9  HGB 13.6  HCT 41.7  PLT 196  CREATININE 1.07*  ALBUMIN 4.1  PROT 8.0  AST 28  ALT 31  ALKPHOS 69  BILITOT 0.3   Estimated Creatinine Clearance: 106.7 mL/min (A) (by C-G formula based on SCr of 1.07 mg/dL (H)).    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   Depression    Edema, lower extremity    Essential hypertension 06/29/2018    Lisinopril-hydrochlorothiazide 20-25 mg   Heart murmur    Hyperhidrosis 05/29/2021   Hyperlipidemia    Has never been on medication for this   Hypertension    Lisinopril-HCTZ   IBS (irritable bowel syndrome)    Intertrigo 05/29/2021   Intrinsic atopic dermatitis 05/28/2021   Lactose intolerance    Migraines    well-controlled when blood pressure is controlled   Morbid obesity ([place]) 02/07/2020   Palpitations 02/07/2020   PCOS (polycystic ovarian syndrome)    10 years; was on metformin for 2 months but didn't like the way it made her feel; has never been on spironolactone   Pre-diabetes    Hemoglobin A1c 6.0 in September 2019   Pure hypercholesterolemia 02/07/2020     Facility-Administered Medications Prior to Admission  Medication Dose Route Frequency Provider Last Rate Last Admin   0.9 %  sodium chloride infusion  500 mL Intravenous Continuous Nelida Meuse III, MD       Medications Prior to Admission  Medication Sig Dispense Refill Last Dose   aluminum chloride (DRYSOL) 20 % external solution Apply to underarms daily to every other day under occlusion as tolerated (Patient taking differently: Apply 1 Application topically daily as needed (sweating).) 60 mL 5 Past Month   Biotin 5000 MCG TABS Take 5,000 mcg by mouth daily.   03/18/2022   buPROPion (WELLBUTRIN XL) 300  MG 24 hr tablet Take 1 tablet (300 mg total) by mouth daily. 90 tablet 1 03/19/2022 at 0415   chlorthalidone (HYGROTON) 25 MG tablet Take 0.5 tablets (12.5 mg total) by mouth daily. 45 tablet 1 03/18/2022   cholecalciferol (VITAMIN D) 25 MCG (1000 UNIT) tablet Take 1 tablet by mouth daily 100 tablet 4 03/18/2022   clobetasol ointment (TEMOVATE) 0.05 % Apply to the affected area of scalp 4-5 times weekly. (Patient taking differently: 1 Application daily as needed (eczema).) 60 g 3 Past Week   cyanocobalamin (VITAMIN B12) 1000 MCG tablet Take 1,000 mcg by mouth daily.   03/18/2022   Dupilumab (DUPIXENT) 300 MG/2ML SOPN Inject 2  mLs (300 mg total) into the skin every 14 days. 4 mL 11 Past Month   hydrocortisone 2.5 % cream Apply to the face twice daily. (Patient taking differently: 1 Application 2 (two) times daily as needed (eczema).) 453.6 g 2 Past Month   Methylphenidate HCl ER, PM, (JORNAY PM) 60 MG CP24 Take 1 capsule by mouth at bedtime. 30 capsule 0 Past Week   metoprolol succinate (TOPROL XL) 25 MG 24 hr tablet Take 1.5 tablets (37.5 mg total) by mouth daily. 135 tablet 3 03/18/2022 at 1930   norgestimate-ethinyl estradiol (VYLIBRA) 0.25-35 MG-MCG tablet TAKE 1 TABLET BY MOUTH DAILY. 84 tablet 3 03/19/2022 at 0415   nystatin (MYCOSTATIN/NYSTOP) powder Apply under the breasts daily for 10 days as needed for flares of intertrigo. 60 g 5 Past Week   tacrolimus (PROTOPIC) 0.1 % ointment Apply once daily to the affected areas. (Patient taking differently: Apply 1 Application topically daily as needed (eczema).) 60 g 3 Past Week   triamcinolone ointment (KENALOG) 0.1 % Apply to the itchy areas twice daily. NEVER to the face. (Patient taking differently: Apply 1 Application topically 2 (two) times daily as needed (eczema).) 453.6 g 3 Past Month   valsartan (DIOVAN) 320 MG tablet Take 1 tablet (320 mg total) by mouth daily. 90 tablet 3 03/18/2022   linaclotide (LINZESS) 72 MCG capsule Take 1 capsule by mouth daily. (Patient taking differently: Take 72 mcg by mouth as needed.) 30 capsule 5 More than a month    Eudelia Bunch, Pharm.D 03/19/2022 1:21 PM

## 2022-03-19 NOTE — H&P (Signed)
Chief Complaint: Weight Loss (Pre-Op)   History of Present Illness: Madison Robertson is a 31 y.o. female who is seen today for preop appointment for sleeve gastrectomy.  She has no new medication problems.  Review of Systems: A complete review of systems was obtained from the patient. I have reviewed this information and discussed as appropriate with the patient. See HPI as well for other ROS.  Review of Systems  All other systems reviewed and are negative.   Medical History: Past Medical History:  Diagnosis Date  Anxiety  Hypertension   There is no problem list on file for this patient.  History reviewed. No pertinent surgical history.   Allergies  Allergen Reactions  Amlodipine Swelling  edema  Hydrocodone Nausea And Vomiting  Elderberry Fruit Other (See Comments)  Throat swelling  Lisinopril Cough   Current Outpatient Medications on File Prior to Visit  Medication Sig Dispense Refill  buPROPion (WELLBUTRIN XL) 150 MG XL tablet  cholecalciferol (VITAMIN D3) 1000 unit capsule Take 1 capsule (1,000 Units total) by mouth once daily 30 capsule 11  DUPIXENT PEN 300 mg/2 mL pen injector  LINZESS 145 mcg capsule  losartan (COZAAR) 50 MG tablet Take 1 tablet by mouth once daily  metoprolol succinate (TOPROL-XL) 25 MG XL tablet  promethazine (PHENERGAN) 25 MG tablet Take 1 tablet by mouth every 8 (eight) hours as needed  TRULICITY 2.70 WC/3.7 mL subcutaneous pen injector  VYLIBRA 0.25-35 mg-mcg tablet   No current facility-administered medications on file prior to visit.   Family History  Problem Relation Age of Onset  Hyperlipidemia (Elevated cholesterol) Mother  Obesity Mother  High blood pressure (Hypertension) Mother  Diabetes Mother  Stroke Father  Hyperlipidemia (Elevated cholesterol) Father  High blood pressure (Hypertension) Father  Coronary Artery Disease (Blocked arteries around heart) Father  High blood pressure (Hypertension) Brother  Diabetes Maternal  Grandmother  Colon cancer Maternal Grandfather  Breast cancer Cousin    Social History   Tobacco Use  Smoking Status Never  Smokeless Tobacco Never    Social History   Socioeconomic History  Marital status: Single  Tobacco Use  Smoking status: Never  Smokeless tobacco: Never  Substance and Sexual Activity  Alcohol use: Yes  Drug use: Never   Objective:   Vitals:  03/01/22 1536  BP: (!) 160/90  Pulse: 91  Temp: 36.9 C (98.4 F)  SpO2: 97%  Weight: (!) 135.5 kg (298 lb 12.8 oz)  Height: 170.2 cm ('5\' 7"'$ )   Body mass index is 46.8 kg/m.  Physical Exam Constitutional:  Appearance: Normal appearance.  HENT:  Head: Normocephalic and atraumatic.  Pulmonary:  Effort: Pulmonary effort is normal.  Musculoskeletal:  General: Normal range of motion.  Cervical back: Normal range of motion.  Neurological:  General: No focal deficit present.  Mental Status: She is alert and oriented to person, place, and time. Mental status is at baseline.  Psychiatric:  Mood and Affect: Mood normal.  Behavior: Behavior normal.  Thought Content: Thought content normal.   Labs, Imaging and Diagnostic Testing:  I reviewed labs, I reviewed endoscopy report. I reviewed psych notes and notes by Sandie Ano  Assessment and Plan:   There are no diagnoses linked to this encounter.   The patient meets weight loss surgery criteria. Due to the above reasons, I think minimally invasive vertical sleeve gastrectomy is the best option for the patient.   We discussed sleeve gastrectomy. We discussed the preoperative, operative and postoperative process. I explained the surgery in detail including  the performance of an EGD near the end of the surgery to test for leak. We discussed the typical hospital course including a 1-2 day stay baring any complications. The patient was given educational material. I quoted the patient that most patients can lose up to 50-70% of their excess weight. We did  discuss the possibility of weight regain several years after the procedure.   The risks of infection, bleeding, pain, scarring, weight regain, too little or too much weight loss, vitamin deficiencies and need for lifelong vitamin supplementation, hair loss, need for protein supplementation, leaks, stricture, reflux, food intolerance, gallstone formation, hernia, need for reoperation, need for open surgery, injury to spleen or surrounding structures, DVT's, PE, and death again discussed with the patient and the patient expressed understanding and desires to proceed with minimally invasive sleeve gastrectomy, possible open, intraoperative endoscopy.  We discussed that before and after surgery that there would be an alteration in their diet. I explained that we may put them on a diet 2 weeks before surgery. I also explained that they would be on a liquid diet for 2 weeks after surgery. We discussed that they would have to avoid certain foods after surgery. We discussed the importance of physical activity as well as compliance with our dietary and supplement recommendations and routine follow-up.

## 2022-03-19 NOTE — Discharge Instructions (Signed)

## 2022-03-20 ENCOUNTER — Other Ambulatory Visit (HOSPITAL_COMMUNITY): Payer: Self-pay

## 2022-03-20 ENCOUNTER — Encounter (HOSPITAL_COMMUNITY): Payer: Self-pay | Admitting: General Surgery

## 2022-03-20 ENCOUNTER — Other Ambulatory Visit: Payer: Self-pay

## 2022-03-20 LAB — CBC WITH DIFFERENTIAL/PLATELET
Abs Immature Granulocytes: 0.05 10*3/uL (ref 0.00–0.07)
Basophils Absolute: 0 10*3/uL (ref 0.0–0.1)
Basophils Relative: 0 %
Eosinophils Absolute: 0 10*3/uL (ref 0.0–0.5)
Eosinophils Relative: 0 %
HCT: 40.4 % (ref 36.0–46.0)
Hemoglobin: 13.3 g/dL (ref 12.0–15.0)
Immature Granulocytes: 0 %
Lymphocytes Relative: 15 %
Lymphs Abs: 2.2 10*3/uL (ref 0.7–4.0)
MCH: 28.5 pg (ref 26.0–34.0)
MCHC: 32.9 g/dL (ref 30.0–36.0)
MCV: 86.7 fL (ref 80.0–100.0)
Monocytes Absolute: 0.9 10*3/uL (ref 0.1–1.0)
Monocytes Relative: 6 %
Neutro Abs: 11.3 10*3/uL — ABNORMAL HIGH (ref 1.7–7.7)
Neutrophils Relative %: 79 %
Platelets: 209 10*3/uL (ref 150–400)
RBC: 4.66 MIL/uL (ref 3.87–5.11)
RDW: 14.7 % (ref 11.5–15.5)
WBC: 14.4 10*3/uL — ABNORMAL HIGH (ref 4.0–10.5)
nRBC: 0 % (ref 0.0–0.2)

## 2022-03-20 LAB — SURGICAL PATHOLOGY

## 2022-03-20 MED ORDER — ACETAMINOPHEN 500 MG PO TABS: 1000.0000 mg | ORAL_TABLET | Freq: Three times a day (TID) | ORAL | 0 refills | Status: AC

## 2022-03-20 MED ORDER — ONDANSETRON 4 MG PO TBDP
4.0000 mg | ORAL_TABLET | Freq: Four times a day (QID) | ORAL | 0 refills | Status: DC | PRN
  Filled 2022-03-20: qty 20, 5d supply, fill #0

## 2022-03-20 MED ORDER — OXYCODONE HCL 5 MG PO TABS
5.0000 mg | ORAL_TABLET | Freq: Four times a day (QID) | ORAL | 0 refills | Status: DC | PRN
  Filled 2022-03-20: qty 10, 3d supply, fill #0

## 2022-03-20 MED ORDER — PANTOPRAZOLE SODIUM 40 MG PO TBEC
40.0000 mg | DELAYED_RELEASE_TABLET | Freq: Every day | ORAL | 0 refills | Status: DC
  Filled 2022-03-20: qty 90, 90d supply, fill #0

## 2022-03-20 NOTE — Progress Notes (Signed)
Discharge instructions given to patient and all questions were answered.  

## 2022-03-20 NOTE — TOC Progression Note (Signed)
Transition of Care [facility]) - Progression Note    Patient Details  Name: Madison Robertson MRN: [id number] Date of Birth: 1991-01-18  Transition of [facility]) CM/SW Contact  Purcell Mouton, RN Phone Number: 03/20/2022, 11:08 AM  Clinical Narrative:     Transition of Care (TOC) Screening Note   Patient Details  Name: Madison Robertson Date of Birth: 11/04/1990   Transition of Care [facility]) CM/SW Contact:    Purcell Mouton, RN Phone Number: 03/20/2022, 11:08 AM    Transition of Care Department (TOC) has reviewed patient and no TOC needs have been identified at this time. We will continue to monitor patient advancement through interdisciplinary progression rounds. If new patient transition needs arise, please place a TOC consult.          Expected Discharge Plan and Services           Expected Discharge Date: 03/20/22                                     Social Determinants of Health (SDOH) Interventions    Readmission Risk Interventions     No data to display

## 2022-03-20 NOTE — Progress Notes (Signed)
Patient alert and oriented, pain is controlled. Patient is tolerating fluids, advanced to protein shake today, patient is tolerating well.  Reviewed Gastric sleeve discharge instructions with patient and patient is able to articulate understanding.  Provided information on BELT program, Support Group and [facility] outpatient pharmacy. All questions answered, will continue to monitor.  

## 2022-03-20 NOTE — Discharge Summary (Signed)
Physician Discharge Summary  Madison Robertson ZWC:[id number] DOB: 1991/03/27 DOA: 03/19/2022  PCP: Inda Coke, PA  Admit date: 03/19/2022 Discharge date: 03/20/2022  Recommendations for Outpatient Follow-up:   (include homehealth, outpatient follow-up instructions, specific recommendations for PCP to follow-up on, etc.)   Follow-up Information     Xayne Brumbaugh, Arta Bruce, MD. Go on 04/10/2022.   Specialty: General Surgery Why: at 10am.  Please arrive 15 minutes prior to your appointment time.  Thank you. Contact information: [address] [place] [address] [phone]         Letita Prentiss, Arta Bruce, MD. Go on 05/08/2022.   Specialty: General Surgery Why: at 9am.  Please arrive 15 minutes prior to your appointment time.  Thank you. Contact information: [address]  [place] [address] [phone]                Discharge Diagnoses:  Principal Problem:   Morbid (severe) obesity due to excess calories [facility])   Surgical Procedure: laparoscopic sleeve gastrectomy, upper endoscopy  Discharge Condition: Good Disposition: Home  Diet recommendation: Postoperative sleeve gastrectomy diet (liquids only)  Filed Weights   03/19/22 0547  Weight: 133.5 kg     Hospital Course:  The patient was admitted after undergoing laparoscopic sleeve gastrectomy. POD 0 she ambulated well. POD 1 she was started on the water diet protocol and tolerated 280 ml in the first shift. Once meeting the water amount she was advanced to bariatric protein shakes which they tolerated and were discharged home POD 1.  Treatments: surgery: laparoscopic sleeve gastrectomy  Discharge Instructions  Discharge Instructions     Ambulate hourly while awake   Complete by: As directed    Call MD for:  difficulty breathing, headache or visual disturbances   Complete by: As directed    Call MD for:  persistant dizziness or light-headedness   Complete by: As directed     Call MD for:  persistant nausea and vomiting   Complete by: As directed    Call MD for:  redness, tenderness, or signs of infection (pain, swelling, redness, odor or green/yellow discharge around incision site)   Complete by: As directed    Call MD for:  severe uncontrolled pain   Complete by: As directed    Call MD for:  temperature >101 F   Complete by: As directed    Diet bariatric full liquid   Complete by: As directed    Discharge wound care:   Complete by: As directed    Remove Bandaids tomorrow, ok to shower tomorrow. Steristrips may fall off in 1-3 weeks.   Incentive spirometry   Complete by: As directed    Perform hourly while awake      Allergies as of 03/20/2022       Reactions   Elderberry    Throat swelling   Amlodipine Other (See Comments)   edema   Hydrocodone Nausea And Vomiting   Lisinopril Cough        Medication List     STOP taking these medications    chlorthalidone 25 MG tablet Commonly known as: HYGROTON       TAKE these medications    acetaminophen 500 MG tablet Commonly known as: TYLENOL Take 2 tablets (1,000 mg total) by mouth every 8 (eight) hours for 5 days.   Biotin 5000 MCG Tabs Take 5,000 mcg by mouth daily.   buPROPion 300 MG 24 hr tablet Commonly known as: Wellbutrin XL Take 1 tablet (  300 mg total) by mouth daily.   cholecalciferol 25 MCG (1000 UNIT) tablet Commonly known as: VITAMIN D3 Take 1 tablet by mouth daily   clobetasol ointment 0.05 % Commonly known as: TEMOVATE Apply to the affected area of scalp 4-5 times weekly. What changed:  how much to take how to take this when to take this reasons to take this   cyanocobalamin 1000 MCG tablet Commonly known as: VITAMIN B12 Take 1,000 mcg by mouth daily.   Drysol 20 % external solution Generic drug: aluminum chloride Apply to underarms daily to every other day under occlusion as tolerated What changed:  how much to take when to take this reasons to take  this   Dupixent 300 MG/2ML Sopn Generic drug: Dupilumab Inject 2 mLs (300 mg total) into the skin every 14 days.   hydrocortisone 2.5 % cream Apply to the face twice daily. What changed:  how much to take how to take this when to take this reasons to take this   Jornay PM 60 MG Cp24 Generic drug: Methylphenidate HCl ER (PM) Take 1 capsule by mouth at bedtime.   Linzess 72 MCG capsule Generic drug: linaclotide Take 1 capsule by mouth daily. What changed:  how much to take how to take this when to take this reasons to take this   metoprolol succinate 25 MG 24 hr tablet Commonly known as: Toprol XL Take 1.5 tablets (37.5 mg total) by mouth daily. Notes to patient: Monitor Blood Pressure Daily and keep a log for primary care physician.  You may need to make changes to your medications with rapid weight loss.     nystatin powder Commonly known as: MYCOSTATIN/NYSTOP Apply under the breasts daily for 10 days as needed for flares of intertrigo.   ondansetron 4 MG disintegrating tablet Commonly known as: ZOFRAN-ODT Take 1 tablet (4 mg total) by mouth every 6 (six) hours as needed for nausea or vomiting.   oxyCODONE 5 MG immediate release tablet Commonly known as: Oxy IR/ROXICODONE Take 1 tablet (5 mg total) by mouth every 6 (six) hours as needed for severe pain.   pantoprazole 40 MG tablet Commonly known as: PROTONIX Take 1 tablet (40 mg total) by mouth daily.   tacrolimus 0.1 % ointment Commonly known as: PROTOPIC Apply once daily to the affected areas. What changed:  how much to take when to take this reasons to take this   triamcinolone ointment 0.1 % Commonly known as: KENALOG Apply to the itchy areas twice daily. NEVER to the face. What changed:  how much to take when to take this reasons to take this   valsartan 320 MG tablet Commonly known as: DIOVAN Take 1 tablet (320 mg total) by mouth daily. Notes to patient: Monitor Blood Pressure Daily and keep a  log for primary care physician.  You may need to make changes to your medications with rapid weight loss.     VyLibra 0.25-35 MG-MCG tablet Generic drug: norgestimate-ethinyl estradiol TAKE 1 TABLET BY MOUTH DAILY.               Discharge Care Instructions  (From admission, onward)           Start     Ordered   03/20/22 0000  Discharge wound care:       Comments: Remove Bandaids tomorrow, ok to shower tomorrow. Steristrips may fall off in 1-3 weeks.   03/20/22 0945            Follow-up Information  Lynne Takemoto, Arta Bruce, MD. Go on 04/10/2022.   Specialty: General Surgery Why: at 10am.  Please arrive 15 minutes prior to your appointment time.  Thank you. Contact information: [address] [place] [address] [phone]         Andjela Wickes, Arta Bruce, MD. Go on 05/08/2022.   Specialty: General Surgery Why: at 9am.  Please arrive 15 minutes prior to your appointment time.  Thank you. Contact information: [address] [place] [address] [phone]                  The results of significant diagnostics from this hospitalization (including imaging, microbiology, ancillary and laboratory) are listed below for reference.    Significant Diagnostic Studies: No results found.  Labs: Basic Metabolic Panel: Recent Labs  Lab 03/19/22 0624  NA 140  K 3.6  CL 108  CO2 26  GLUCOSE 87  BUN 12  CREATININE 1.07*  CALCIUM 9.2   Liver Function Tests: Recent Labs  Lab 03/19/22 0624  AST 28  ALT 31  ALKPHOS 69  BILITOT 0.3  PROT 8.0  ALBUMIN 4.1    CBC: Recent Labs  Lab 03/19/22 0624 03/19/22 1438 03/20/22 0506  WBC 8.9  --  14.4*  NEUTROABS 5.9  --  11.3*  HGB 13.6 13.2 13.3  HCT 41.7 40.3 40.4  MCV 86.7  --  86.7  PLT 196  --  209    CBG: Recent Labs  Lab 03/19/22 0600  GLUCAP 84    Principal Problem:   Morbid (severe) obesity due to excess calories ([facility])   VTE plan: no chemical  prophylaxis recommended ([email or web address])  Time coordinating discharge: 15 min

## 2022-03-21 ENCOUNTER — Encounter: Payer: Self-pay | Admitting: Physician Assistant

## 2022-03-21 ENCOUNTER — Telehealth (HOSPITAL_COMMUNITY): Payer: Self-pay | Admitting: *Deleted

## 2022-03-21 NOTE — Telephone Encounter (Signed)
1.  Tell me about your pain and pain management? Pt states that she has been experiencing some intermittent abdominal cramping while drinking that lasts for a few seconds and then "goes away".  Pt states that "you can just feel it going down like it spasming".  Pt can tolerate protein shakes and water.  Pt instructed to call [facility] if pain worsens.  2.  Let's talk about fluid intake.  How much total fluid are you taking in? Pt states that she is working to meet goal of 64 oz of fluid today.  Pt has been able to consume approx. 50 oz of fluid per day since surgery.  Pt plans to increase clear liquids to meet fluid goals.  Pt instructed to assess status and suggestions daily utilizing Hydration Action Plan on discharge folder and to call [facility] if in the "red zone".   3.  How much protein have you taken in the last 2 days? Pt states she is meeting her goal of 60g of protein each day with the protein shakes.  4.  Have you had nausea?  Tell me about when have experienced nausea and what you did to help? Pt denies nausea.   5.  Has the frequency or color changed with your urine? Pt states that she is urinating "fine" with no changes in frequency or urgency.     6.  Tell me what your incisions look like? "Incisions look fine". Pt denies a fever, chills.  Pt states incisions are not swollen, open, or draining.  Pt encouraged to call [facility] if incisions change.   7.  Have you been passing gas? BM? Pt states that she has not had a BM.  Pt instructed to take either Miralax or MoM as instructed per "Gastric Bypass/Sleeve Discharge Home Care Instructions".  Pt to call surgeon's office if not able to have BM with medication.   8.  If a problem or question were to arise who would you call?  Do you know contact numbers for [facility], [facility], and [facility]? Pt denies dehydration symptoms.  Pt can describe s/sx of dehydration.  Pt knows to call [facility] for surgical, [facility] for nutrition, and [facility] for non-urgent questions or concerns.   9.   How has the walking going? Pt states she is walking around and able to be active without difficulty.   10. Are you still using your incentive spirometer?  If so, how often? Pt states that she is doing the I.S. hourly. Pt encouraged to use incentive spirometer, at least 10x every hour while awake until she sees the surgeon.  11.  How are your vitamins and calcium going?  How are you taking them? Pt states that she will begin the supplements tomorrow.  Reinforced education about taking supplements at least two hours apart.  Reminded patient that the first 30 days post-operatively are important for successful recovery.  Practice good hand hygiene, wearing a mask when appropriate (since optional in most places), and minimizing exposure to people who live outside of the home, especially if they are exhibiting any respiratory, GI, or illness-like symptoms.

## 2022-03-22 ENCOUNTER — Encounter (HOSPITAL_BASED_OUTPATIENT_CLINIC_OR_DEPARTMENT_OTHER): Payer: Self-pay | Admitting: Cardiovascular Disease

## 2022-03-22 ENCOUNTER — Other Ambulatory Visit (HOSPITAL_COMMUNITY): Payer: Self-pay

## 2022-03-22 DIAGNOSIS — I10 Essential (primary) hypertension: Secondary | ICD-10-CM

## 2022-03-22 MED ORDER — IRBESARTAN 300 MG PO TABS
300.0000 mg | ORAL_TABLET | Freq: Every day | ORAL | 1 refills | Status: DC
  Filled 2022-03-22: qty 90, 90d supply, fill #0

## 2022-03-23 ENCOUNTER — Other Ambulatory Visit (HOSPITAL_COMMUNITY): Payer: Self-pay

## 2022-03-23 MED ORDER — JORNAY PM 60 MG PO CP24
ORAL_CAPSULE | ORAL | 0 refills | Status: DC
  Filled 2022-03-23: qty 30, 30d supply, fill #0

## 2022-03-25 ENCOUNTER — Other Ambulatory Visit (HOSPITAL_COMMUNITY): Payer: Self-pay

## 2022-04-02 ENCOUNTER — Encounter: Payer: 59 | Attending: General Surgery | Admitting: Skilled Nursing Facility1

## 2022-04-03 ENCOUNTER — Encounter: Payer: Self-pay | Admitting: Skilled Nursing Facility1

## 2022-04-03 NOTE — Progress Notes (Signed)
2 Week Post-Operative Nutrition Class   Patient was seen on 04/03/2022 for Post-Operative Nutrition education at the [facility].    Surgery date: 03/19/2022 Surgery type: sleeve Start weight at [facility]: 292.5 Weight today: 278 pounds Bowel Habits: Every day to every other day no complaints   Body Composition Scale 04/03/2022  Current Body Weight 278  Total Body Fat % 44.9  Visceral Fat 13  Fat-Free Mass % 55   Total Body Water % 42  Muscle-Mass lbs 35.9  BMI 42.8  Body Fat Displacement          Torso  lbs 77.5         Left Leg  lbs 15.5         Right Leg  lbs 15.5         Left Arm  lbs 7.7         Right Arm   lbs 7.7      The following the learning objectives were met by the patient during this course: Identifies Phase 3 (Soft, High Proteins) Dietary Goals and will begin from 2 weeks post-operatively to 2 months post-operatively Identifies appropriate sources of fluids and proteins  Identifies appropriate fat sources and healthy verses unhealthy fat types   States protein recommendations and appropriate sources post-operatively Identifies the need for appropriate texture modifications, mastication, and bite sizes when consuming solids Identifies appropriate fat consumption and sources Identifies appropriate multivitamin and calcium sources post-operatively Describes the need for physical activity post-operatively and will follow MD recommendations States when to call healthcare provider regarding medication questions or post-operative complications   Handouts given during class include: Phase 3A: Soft, High Protein Diet Handout Phase 3 High Protein Meals Healthy Fats   Follow-Up Plan: Patient will follow-up at [facility] in 6 weeks for 2 month post-op nutrition visit for diet advancement per MD.

## 2022-04-08 ENCOUNTER — Telehealth: Payer: Self-pay | Admitting: Skilled Nursing Facility1

## 2022-04-08 NOTE — Telephone Encounter (Signed)
RD called pt to verify fluid intake once starting soft, solid proteins 2 week post-bariatric surgery.   Daily Fluid intake: 60 oz Daily Protein intake: 60 g Bowel Habits: every day to every other day  Concerns/issues:   None stated

## 2022-04-11 ENCOUNTER — Other Ambulatory Visit (HOSPITAL_COMMUNITY): Payer: Self-pay

## 2022-04-16 ENCOUNTER — Other Ambulatory Visit (HOSPITAL_COMMUNITY): Payer: Self-pay

## 2022-04-17 ENCOUNTER — Other Ambulatory Visit (HOSPITAL_COMMUNITY): Payer: Self-pay

## 2022-04-21 ENCOUNTER — Telehealth: Payer: 59 | Admitting: Family

## 2022-04-21 DIAGNOSIS — J069 Acute upper respiratory infection, unspecified: Secondary | ICD-10-CM

## 2022-04-21 MED ORDER — PREDNISONE 10 MG (21) PO TBPK
ORAL_TABLET | ORAL | 0 refills | Status: DC
  Filled 2022-04-21: qty 21, 6d supply, fill #0

## 2022-04-21 MED ORDER — FLUTICASONE PROPIONATE 50 MCG/ACT NA SUSP
2.0000 | Freq: Every day | NASAL | 6 refills | Status: DC
  Filled 2022-04-21: qty 16, 30d supply, fill #0

## 2022-04-21 MED ORDER — BENZONATATE 100 MG PO CAPS
100.0000 mg | ORAL_CAPSULE | Freq: Three times a day (TID) | ORAL | 0 refills | Status: DC | PRN
  Filled 2022-04-21: qty 20, 7d supply, fill #0

## 2022-04-21 NOTE — Progress Notes (Signed)

## 2022-04-22 ENCOUNTER — Encounter: Payer: Self-pay | Admitting: *Deleted

## 2022-04-22 ENCOUNTER — Other Ambulatory Visit (HOSPITAL_COMMUNITY): Payer: Self-pay

## 2022-05-04 ENCOUNTER — Other Ambulatory Visit (HOSPITAL_COMMUNITY): Payer: Self-pay

## 2022-05-06 ENCOUNTER — Other Ambulatory Visit (HOSPITAL_COMMUNITY): Payer: Self-pay

## 2022-05-06 DIAGNOSIS — Z79899 Other long term (current) drug therapy: Secondary | ICD-10-CM | POA: Diagnosis not present

## 2022-05-06 DIAGNOSIS — F900 Attention-deficit hyperactivity disorder, predominantly inattentive type: Secondary | ICD-10-CM | POA: Diagnosis not present

## 2022-05-06 MED ORDER — JORNAY PM 60 MG PO CP24
1.0000 | ORAL_CAPSULE | Freq: Every day | ORAL | 0 refills | Status: DC
Start: 2022-05-06 — End: 2022-06-15
  Filled 2022-05-06: qty 30, 30d supply, fill #0

## 2022-05-07 ENCOUNTER — Other Ambulatory Visit (HOSPITAL_COMMUNITY): Payer: Self-pay

## 2022-05-07 DIAGNOSIS — Z79899 Other long term (current) drug therapy: Secondary | ICD-10-CM | POA: Diagnosis not present

## 2022-05-08 DIAGNOSIS — K912 Postsurgical malabsorption, not elsewhere classified: Secondary | ICD-10-CM | POA: Diagnosis not present

## 2022-05-08 DIAGNOSIS — Z9884 Bariatric surgery status: Secondary | ICD-10-CM | POA: Diagnosis not present

## 2022-05-08 DIAGNOSIS — E569 Vitamin deficiency, unspecified: Secondary | ICD-10-CM | POA: Diagnosis not present

## 2022-05-09 ENCOUNTER — Other Ambulatory Visit (HOSPITAL_COMMUNITY): Payer: Self-pay

## 2022-05-13 ENCOUNTER — Other Ambulatory Visit (HOSPITAL_COMMUNITY): Payer: Self-pay

## 2022-05-14 ENCOUNTER — Encounter: Payer: 59 | Attending: General Surgery | Admitting: Dietician

## 2022-05-14 ENCOUNTER — Encounter: Payer: Self-pay | Admitting: Dietician

## 2022-05-14 ENCOUNTER — Other Ambulatory Visit (HOSPITAL_COMMUNITY): Payer: Self-pay

## 2022-05-14 DIAGNOSIS — E669 Obesity, unspecified: Secondary | ICD-10-CM

## 2022-05-14 NOTE — Progress Notes (Signed)
Bariatric Nutrition Follow-Up Visit Medical Nutrition Therapy  Appt Start Time: 7:55   End Time: 8:23  Surgery date: 03/19/2022 Surgery type: sleeve  NUTRITION ASSESSMENT   Anthropometrics  Start weight at [facility]: 292.5 lbs (date: 09/06/2021)  Height: 67.5 in Weight today: 263.2 BMI: 40.61 kg/m2     Clinical  Medical hx: IBS-C/D, PCOS, depression, anxiety, HTN, hyperlipidemia  Medications: see list  Labs: vitamin b12 1480, cholesterol 201, LDL 126, BUN5, BUN/creatinine ratio 6 Notable signs/symptoms: cluster headaches  Any previous deficiencies? Yes: vitamin D, vitamin B12     Body Composition Scale 04/03/2022 05/14/2022  Current Body Weight 278 263.2  Total Body Fat % 44.9 43.5  Visceral Fat 13 12  Fat-Free Mass % 55 56.4   Total Body Water % 42 42.7  Muscle-Mass lbs 35.9 35.7  BMI 42.8 40.4  Body Fat Displacement           Torso  lbs 77.5 71.1         Left Leg  lbs 15.5 14.2         Right Leg  lbs 15.5 14.2         Left Arm  lbs 7.7 7.1         Right Arm   lbs 7.7 7.1     Lifestyle & Dietary Hx  PT states she only takes metoprolol, stating she has been taken off of all other blood pressure medications. Pt states her appetite was dropping when she had a cold, stating the post nasal drip was filling her stomach.  Pt states her appetite comes back in the afternoon. Pt states the zero sugar minute maid didn't sit well on her stomach. Pt states her bowel movements are daily to every other day.   Estimated daily fluid intake: 64 oz Estimated daily protein intake: 45-50 g Supplements: multivitamin and calcium Current average weekly physical activity: walk on the treadmill 15 minutes 3 times a week, and walk at job every day at lunch (3 laps).   24-Hr Dietary Recall First Meal: eggs and canadian bacon Snack:   Second Meal: chili and cheese stick or protein yogurt Snack: yogurt or protein shake  Third Meal: baked chicken or pork or [place] or refried beans Snack:   Beverages: water, water with SEEQ protein.  Post-Op Goals/ Signs/ Symptoms Using straws: no Drinking while eating: no Chewing/swallowing difficulties: no Changes in vision: no Changes to mood/headaches: no Hair loss/changes to skin/nails: no Difficulty focusing/concentrating: no Sweating: no Limb weakness: no Dizziness/lightheadedness: no Palpitations: no  Carbonated/caffeinated beverages: no N/V/D/C/Gas: no Abdominal pain: no Dumping syndrome: no    NUTRITION DIAGNOSIS  Overweight/obesity ([place]-3.3) related to past poor dietary habits and physical inactivity as evidenced by completed bariatric surgery and following dietary guidelines for continued weight loss and healthy nutrition status.     NUTRITION INTERVENTION Nutrition counseling (C-1) and education (E-2) to facilitate bariatric surgery goals, including: Diet advancement to the next phase (phase 4) now including non-starchy vegetables The importance of consuming adequate calories as well as certain nutrients daily due to the body's need for essential vitamins, minerals, and fats The importance of daily physical activity and to reach a goal of at least 150 minutes of moderate to vigorous physical activity weekly (or as directed by their physician) due to benefits such as increased musculature and improved lab values The importance of intuitive eating specifically learning hunger-satiety cues and understanding the importance of learning a new body: The importance of mindful eating to avoid grazing behaviors  Handouts Provided Include  Phase 4 Food Plan  Learning Style & Readiness for Change Teaching method utilized: Visual & Auditory  Demonstrated degree of understanding via: Teach Back  Readiness Level: Action Barriers to learning/adherence to lifestyle change: none identified  RD's Notes for Next Visit Assess adherence to pt chosen goals   MONITORING & EVALUATION Dietary intake, weekly physical activity, body  weight.  Next Steps Patient is to follow-up in January for 6 month class.

## 2022-05-15 ENCOUNTER — Other Ambulatory Visit (HOSPITAL_COMMUNITY): Payer: Self-pay

## 2022-05-15 ENCOUNTER — Other Ambulatory Visit: Payer: Self-pay | Admitting: Pharmacist

## 2022-05-15 MED ORDER — DUPIXENT 300 MG/2ML ~~LOC~~ SOAJ: SUBCUTANEOUS | 7 refills | Status: DC

## 2022-05-15 MED ORDER — DUPIXENT 300 MG/2ML ~~LOC~~ SOAJ
SUBCUTANEOUS | 7 refills | Status: DC
Start: 2022-05-15 — End: 2023-01-29
  Filled 2022-05-15: qty 4, fill #0
  Filled 2022-05-30: qty 4, 28d supply, fill #0
  Filled 2022-07-04 – 2022-07-12 (×3): qty 4, 28d supply, fill #1
  Filled 2022-08-06: qty 4, 28d supply, fill #2
  Filled 2022-09-11: qty 4, 28d supply, fill #3
  Filled 2022-10-07: qty 4, 28d supply, fill #4
  Filled 2022-11-06: qty 4, 28d supply, fill #5
  Filled 2022-12-04: qty 4, 28d supply, fill #6
  Filled 2023-01-02: qty 4, 28d supply, fill #7

## 2022-05-16 ENCOUNTER — Other Ambulatory Visit (HOSPITAL_COMMUNITY): Payer: Self-pay

## 2022-05-30 ENCOUNTER — Other Ambulatory Visit (HOSPITAL_COMMUNITY): Payer: Self-pay

## 2022-06-03 ENCOUNTER — Other Ambulatory Visit (HOSPITAL_COMMUNITY): Payer: Self-pay

## 2022-06-04 ENCOUNTER — Other Ambulatory Visit (HOSPITAL_COMMUNITY): Payer: Self-pay

## 2022-06-06 ENCOUNTER — Telehealth: Payer: Self-pay | Admitting: Pharmacist

## 2022-06-06 NOTE — Telephone Encounter (Signed)
Called patient to schedule an appointment for the [employer] [place] [employer] [facility]. I was unable to reach the patient so I left a HIPAA-compliant message requesting that the patient return my call.   Luke Van Ausdall, PharmD, BCACP, CPP Clinical Pharmacist [facility] [phone]  

## 2022-06-10 ENCOUNTER — Other Ambulatory Visit (HOSPITAL_COMMUNITY): Payer: Self-pay

## 2022-06-15 ENCOUNTER — Other Ambulatory Visit (HOSPITAL_COMMUNITY): Payer: Self-pay

## 2022-06-15 MED ORDER — JORNAY PM 60 MG PO CP24
1.0000 | ORAL_CAPSULE | Freq: Every day | ORAL | 0 refills | Status: DC
Start: 2022-06-14 — End: 2022-06-18
  Filled 2022-06-15: qty 30, 30d supply, fill #0

## 2022-06-18 ENCOUNTER — Other Ambulatory Visit (HOSPITAL_COMMUNITY): Payer: Self-pay

## 2022-06-18 MED ORDER — JORNAY PM 60 MG PO CP24
60.0000 mg | ORAL_CAPSULE | Freq: Every day | ORAL | 0 refills | Status: DC
  Filled 2022-06-18 – 2022-08-27 (×2): qty 30, 30d supply, fill #0

## 2022-06-19 ENCOUNTER — Other Ambulatory Visit (HOSPITAL_COMMUNITY): Payer: Self-pay

## 2022-06-19 MED ORDER — JORNAY PM 60 MG PO CP24
1.0000 | ORAL_CAPSULE | Freq: Every day | ORAL | 0 refills | Status: DC
Start: 2022-06-19 — End: 2022-10-01
  Filled 2022-06-19 – 2022-07-23 (×2): qty 30, 30d supply, fill #0

## 2022-06-27 ENCOUNTER — Other Ambulatory Visit (HOSPITAL_COMMUNITY): Payer: Self-pay

## 2022-06-28 ENCOUNTER — Other Ambulatory Visit (HOSPITAL_COMMUNITY): Payer: Self-pay

## 2022-07-01 ENCOUNTER — Other Ambulatory Visit (HOSPITAL_COMMUNITY): Payer: Self-pay

## 2022-07-03 ENCOUNTER — Other Ambulatory Visit (HOSPITAL_COMMUNITY): Payer: Self-pay

## 2022-07-04 ENCOUNTER — Other Ambulatory Visit (HOSPITAL_COMMUNITY): Payer: Self-pay

## 2022-07-11 ENCOUNTER — Other Ambulatory Visit: Payer: Self-pay | Admitting: Physician Assistant

## 2022-07-11 ENCOUNTER — Other Ambulatory Visit: Payer: Self-pay

## 2022-07-11 ENCOUNTER — Other Ambulatory Visit (HOSPITAL_COMMUNITY): Payer: Self-pay

## 2022-07-11 ENCOUNTER — Telehealth: Payer: Self-pay | Admitting: Pharmacist

## 2022-07-11 MED ORDER — NORGESTIMATE-ETH ESTRADIOL 0.25-35 MG-MCG PO TABS
1.0000 | ORAL_TABLET | Freq: Every day | ORAL | 3 refills | Status: DC
  Filled 2022-07-11: qty 84, 84d supply, fill #0
  Filled 2022-10-01: qty 84, 84d supply, fill #1
  Filled 2023-01-02: qty 84, 84d supply, fill #2

## 2022-07-11 NOTE — Telephone Encounter (Signed)
Called patient to schedule an appointment for the [place] [employer] [facility]. I was unable to reach the patient so I left a HIPAA-compliant message requesting that the patient return my call.   Luke Van Ausdall, PharmD, BCACP, CPP Clinical Pharmacist [facility] [phone]  

## 2022-07-12 ENCOUNTER — Other Ambulatory Visit (HOSPITAL_COMMUNITY): Payer: Self-pay

## 2022-07-12 ENCOUNTER — Other Ambulatory Visit: Payer: Self-pay

## 2022-07-16 ENCOUNTER — Other Ambulatory Visit: Payer: Self-pay

## 2022-07-16 ENCOUNTER — Other Ambulatory Visit (HOSPITAL_COMMUNITY): Payer: Self-pay

## 2022-07-18 ENCOUNTER — Encounter (HOSPITAL_BASED_OUTPATIENT_CLINIC_OR_DEPARTMENT_OTHER): Payer: Self-pay | Admitting: Cardiovascular Disease

## 2022-07-18 ENCOUNTER — Ambulatory Visit (HOSPITAL_BASED_OUTPATIENT_CLINIC_OR_DEPARTMENT_OTHER): Payer: 59 | Admitting: Cardiovascular Disease

## 2022-07-18 VITALS — BP 122/84 | HR 95 | Ht 67.5 in | Wt 254.6 lb

## 2022-07-18 DIAGNOSIS — R002 Palpitations: Secondary | ICD-10-CM

## 2022-07-18 DIAGNOSIS — I10 Essential (primary) hypertension: Secondary | ICD-10-CM

## 2022-07-18 NOTE — Assessment & Plan Note (Addendum)
Blood pressure has been controlled at home.  It has been in the 110-120s/60-70s.  She is doing a great job with exercising.  BP was initially elevated but better on repeat.  Continue metoprolol. She is no longer on irbesartan.

## 2022-07-18 NOTE — Progress Notes (Signed)
Cardiology Office Note  Date:  07/18/2022   ID:  Madison Robertson, Madison Robertson 1991-02-02, MRN [id number]  PCP:  Inda Coke, [place]  Cardiologist:   Skeet Latch, MD   No chief complaint on file.    History of Present Illness: Madison Robertson is a 31 y.o. female with hypertension, hyperlipidemia, prediabetes, asthma, anxiety and edema here for follow-up. She was initially seen 01/2020 for the evaluation of tachycardia and family history of CAD.  She saw Ms.Worley on 10/2019 and noted that her heart rate was sporadically over 120 at rest.  She received notifications from her [place] watch.  She note that her heart rate has been fast since her teens.  Her pediatrician noted that it was due to her ADHD medication.  Her heart rate came down the 90s when it was held.  She started back on the medication and her rate has been consistently in the 100s since then. She was minimally bothered by the symptoms. She had an echocardiogram 01/2020 that revealed LVEF 60 to 65% with normal diastolic function. She also reported lower extremity edema but there is no evidence of volume overload on her echo. She reported some bradycardia and dizziness after her COVID-19 vaccine.  She had sinus tachycardia thought to be due to her ADHD meds. She was started on metoprolol for her symptoms.  She developed a cough after starting lisinopril so was stopped and switched to 50 mg losartan by her PCP. Her blood pressure was uncontrolled so losartan was switched to valsartan. She followed up with Laurann Montana, NP, and her blood pressure was better controlled but her diastolics were in the 024O. Chlorthalidone was added.   At her last visit, she was doing better but also reported blood pressures as high as 973 systolic. Her home blood pressures were well controlled. She had a gastric sleeve operation 02/2022. Valsartan was switched to irbesartan due to the size of the tablet. She has had considerable weight loss, and blood pressures  were low so irbesartan was discontinued.  Today, the patient states that she is feeling well and denies recurring  palpitations or chest pressure. She is continuing the use of metropolol but reduced her dose to '25mg'$  because of drowsiness. She was suddenly falling asleep while at work. Her blood pressure at home has been controlled, it was 117/64 on Saturday and 126/70 3 weeks ago. In clinic today, her blood pressure was 122/84 on recheck. For exercise she does two laps around work at lunch time and does 30 minutes on the treadmill before and after work, Monday - Friday. She is also able to walk up stairs without being short of breath. She is continuing to lose weight. She denies any shortness of breath, or peripheral edema. No lightheadedness, headaches, syncope, orthopnea, or PND.   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   Depression    Edema, lower extremity    Essential hypertension 06/29/2018   Lisinopril-hydrochlorothiazide 20-25 mg   Heart murmur    Hyperhidrosis 05/29/2021   Hyperlipidemia    Has never been on medication for this   Hypertension    Lisinopril-HCTZ   IBS (irritable bowel syndrome)    Intertrigo 05/29/2021   Intrinsic atopic dermatitis 05/28/2021   Lactose intolerance  Migraines    well-controlled when blood pressure is controlled   Morbid obesity ([place]) 02/07/2020   Palpitations 02/07/2020   PCOS (polycystic ovarian syndrome)    10 years; was on metformin for 2 months but didn't like the way it made her feel; has never been on spironolactone   Pre-diabetes    Hemoglobin A1c 6.0 in September 2019   Pure hypercholesterolemia 02/07/2020    Past Surgical History:  Procedure Laterality Date   COLONOSCOPY  2022   ESOPHAGOGASTRODUODENOSCOPY ENDOSCOPY     UPPER GI ENDOSCOPY N/A  03/19/2022   Procedure: UPPER GI ENDOSCOPY;  Surgeon: Mickeal Skinner, MD;  Location: [facility];  Service: General;  Laterality: N/A;   WISDOM TOOTH EXTRACTION     Feb 2017     Current Outpatient Medications  Medication Sig Dispense Refill   metoprolol succinate (TOPROL-XL) 25 MG 24 hr tablet Take 25 mg by mouth daily.     aluminum chloride (DRYSOL) 20 % external solution Apply to underarms daily to every other day under occlusion as tolerated (Patient taking differently: Apply 1 Application topically daily as needed (sweating).) 60 mL 5   buPROPion (WELLBUTRIN XL) 300 MG 24 hr tablet Take 1 tablet (300 mg total) by mouth daily. 90 tablet 1   clobetasol ointment (TEMOVATE) 0.05 % Apply to the affected area of scalp 4-5 times weekly. (Patient taking differently: 1 Application daily as needed (eczema).) 60 g 3   Dupilumab (DUPIXENT) 300 MG/2ML SOPN Inject 2 mLs (300 mg total) into the skin every 14 days. 4 mL 7   fluticasone (FLONASE) 50 MCG/ACT nasal spray Place 2 sprays into both nostrils daily. 16 g 6   hydrocortisone 2.5 % cream Apply to the face twice daily. (Patient taking differently: 1 Application 2 (two) times daily as needed (eczema).) 453.6 g 2   Methylphenidate HCl ER, PM, (JORNAY PM) 60 MG CP24 Take one capsule (60 mg) by mouth at bedtime. 30 capsule 0   Methylphenidate HCl ER, PM, (JORNAY PM) 60 MG CP24 Take 1 capsule by mouth at bedtime. 30 capsule 0   norgestimate-ethinyl estradiol (VYLIBRA) 0.25-35 MG-MCG tablet Take 1 tablet by mouth daily. 84 tablet 3   nystatin (MYCOSTATIN/NYSTOP) powder Apply under the breasts daily for 10 days as needed for flares of intertrigo. 60 g 5   tacrolimus (PROTOPIC) 0.1 % ointment Apply once daily to the affected areas. (Patient taking differently: Apply 1 Application topically daily as needed (eczema).) 60 g 3   Current Facility-Administered Medications  Medication Dose Route Frequency Provider Last Rate Last Admin   0.9 %  sodium chloride  infusion  500 mL Intravenous Continuous Danis, Kirke Corin, MD        Allergies:   Elderberry, Amlodipine, Hydrocodone, and Lisinopril    Social History:  The patient  reports that she has never smoked. She has never used smokeless tobacco. She reports current alcohol use. She reports that she does not use drugs.   Family History:  The patient's family history includes Anxiety disorder in her mother; Atrial fibrillation in her father; Breast cancer in an other family member; Colon cancer in her maternal grandfather; Colon polyps in her mother; Congestive Heart Failure in her father; Depression in her mother; Diabetes in her father and mother; Esophageal cancer in her maternal grandmother and maternal uncle; Heart attack in her father; Heart disease in her father; Heart failure in her mother; High Cholesterol in her mother; Hypertension in her father, maternal grandfather, maternal grandmother, mother, paternal  grandfather, and paternal grandmother; Obesity in her mother; Ovarian cancer in an other family member; Prostate cancer in her paternal grandfather; Uterine cancer in an other family member.    ROS:   Please see the history of present illness.  All other systems are reviewed and negative.    PHYSICAL EXAM: VS:  BP 122/84 (BP Location: Right Arm, Patient Position: Sitting, Cuff Size: Large)   Pulse 95   Ht 5' 7.5" (1.715 m)   Wt 254 lb 9.6 oz (115.5 kg)   BMI 39.29 kg/m  , BMI Body mass index is 39.29 kg/m. GENERAL:  Well appearing HEENT: Pupils equal round and reactive, fundi not visualized, oral mucosa unremarkable NECK:  No jugular venous distention, waveform within normal limits, carotid upstroke brisk and symmetric, no bruits, no thyromegaly LUNGS:  Clear to auscultation bilaterally HEART:  RRR.  PMI not displaced or sustained,S1 and S2 within normal limits, no S3, no S4, no clicks, no rubs, no murmurs ABD:  Flat, positive bowel sounds normal in frequency in pitch, no bruits,  no rebound, no guarding, no midline pulsatile mass, no hepatomegaly, no splenomegaly EXT:  2 plus pulses throughout, no edema, no cyanosis no clubbing SKIN:  No rashes no nodules NEURO:  Cranial nerves II through XII grossly intact, motor grossly intact throughout PSYCH:  Cognitively intact, oriented to person place and time  EKG:  EKG is personally reviewed. 07/18/2022: Sinus rhythm. Rate 95 bpm.  03/18/22: EKG was not ordered.   10/31/2021:  sinus tachycardia rate-111 bpm The ekg ordered 02/07/20 demonstrates sinus tachycardia.  Rate 109 bpm.   Other studies reviewed:  Echo 02/25/2020:   1. Normal coronary artery origins.   2. Left ventricular ejection fraction, by estimation, is 60 to 65%. The  left ventricle has normal function. The left ventricle has no regional  wall motion abnormalities. Left ventricular diastolic parameters were  normal.   3. Right ventricular systolic function is normal. The right ventricular  size is normal.   4. The mitral valve is normal in structure. Trivial mitral valve  regurgitation. No evidence of mitral stenosis.   5. The aortic valve is normal in structure. Aortic valve regurgitation is  not visualized. No aortic stenosis is present.   6. The inferior vena cava is normal in size with greater than 50%  respiratory variability, suggesting right atrial pressure of 3 mmHg.   Recent Labs: 03/19/2022: ALT 31; BUN 12; Creatinine, Ser 1.07; Potassium 3.6; Sodium 140 03/20/2022: Hemoglobin 13.3; Platelets 209    Lipid Panel    Component Value Date/Time   CHOL 166 02/21/2021 1114   CHOL 177 01/25/2019 1145   TRIG 91.0 02/21/2021 1114   HDL 47.70 02/21/2021 1114   HDL 54 01/25/2019 1145   CHOLHDL 3 02/21/2021 1114   VLDL 18.2 02/21/2021 1114   LDLCALC 100 (H) 02/21/2021 1114   LDLCALC 109 (H) 01/25/2019 1145      Wt Readings from Last 3 Encounters:  07/18/22 254 lb 9.6 oz (115.5 kg)  05/14/22 263 lb 3.2 oz (119.4 kg)  04/03/22 278 lb (126.1 kg)       ASSESSMENT AND PLAN:  Palpitations Symptoms have been controlled.  She reduced metoprolol to '25mg'$  due to drowsiness.    Essential hypertension Blood pressure has been controlled at home.  It has been in the 110-120s/60-70s.  She is doing a great job with exercising.  BP was initially elevated but better on repeat.  Continue metoprolol. She is no longer on irbesartan.  Morbid (  severe) obesity due to excess calories [facility]) She is doing a great job with exercise and weight loss post gastric bypass.    Medication Adjustments/Labs and Tests Ordered: Current medicines are reviewed at length with the patient today.  Concerns regarding medicines are outlined above.   Orders Placed This Encounter  Procedures   EKG 12-Lead   No orders of the defined types were placed in this encounter.  Patient Instructions  Medication Instructions:  Your physician recommends that you continue on your current medications as directed. Please refer to the Current Medication list given to you today.   *If you need a refill on your cardiac medications before your next appointment, please call your pharmacy*  Lab Work: NONE  Testing/Procedures: NONE  Follow-Up: At [facility], you and your health needs are our priority.  As part of our continuing mission to provide you with exceptional heart care, we have created designated Provider Care Teams.  These Care Teams include your primary Cardiologist (physician) and Advanced Practice Providers (APPs -  Physician Assistants and Nurse Practitioners) who all work together to provide you with the care you need, when you need it.  We recommend signing up for the patient portal called "MyChart".  Sign up information is provided on this After Visit Summary.  MyChart is used to connect with patients for Virtual Visits (Telemedicine).  Patients are able to view lab/test results, encounter notes, upcoming appointments, etc.  Non-urgent messages can be sent to  your provider as well.   To learn more about what you can do with MyChart, go to [email or web address].    Your next appointment:   12 month(s)  The format for your next appointment:   In Person  Provider:   Skeet Latch, MD                   Disposition: FU with Eudora Guevarra C. Oval Linsey, MD, [facility] in 1 year.  I,Jada L Brogden,acting as a scribe for Skeet Latch, MD.,have documented all relevant documentation on the behalf of Skeet Latch, MD,as directed by  Skeet Latch, MD while in the presence of Skeet Latch, MD.   I, [place] Oval Linsey, MD have reviewed all documentation for this visit.  The documentation of the exam, diagnosis, procedures, and orders on 07/18/2022 are all accurate and complete.   Signed, Bradly Sangiovanni C. Oval Linsey, MD, [facility]  07/18/2022 9:26 AM    [facility]

## 2022-07-18 NOTE — Patient Instructions (Signed)
Medication Instructions:  Your physician recommends that you continue on your current medications as directed. Please refer to the Current Medication list given to you today.  *If you need a refill on your cardiac medications before your next appointment, please call your pharmacy*  Lab Work: NONE  Testing/Procedures: NONE  Follow-Up: At [facility], you and your health needs are our priority.  As part of our continuing mission to provide you with exceptional heart care, we have created designated Provider Care Teams.  These Care Teams include your primary Cardiologist (physician) and Advanced Practice Providers (APPs -  Physician Assistants and Nurse Practitioners) who all work together to provide you with the care you need, when you need it.  We recommend signing up for the patient portal called "MyChart".  Sign up information is provided on this After Visit Summary.  MyChart is used to connect with patients for Virtual Visits (Telemedicine).  Patients are able to view lab/test results, encounter notes, upcoming appointments, etc.  Non-urgent messages can be sent to your provider as well.   To learn more about what you can do with MyChart, go to [email or web address].    Your next appointment:   12 month(s)  The format for your next appointment:   In Person  Provider:   Tiffany Redstone, MD     

## 2022-07-18 NOTE — Progress Notes (Signed)
Cardiology Office Note  Date:  07/18/2022   ID:  Madison Robertson, Madison Robertson 03-17-1991, MRN [id number]  PCP:  Inda Coke, [place]  Cardiologist:   Skeet Latch, MD   No chief complaint on file.    History of Present Illness: Madison Robertson is a 31 y.o. female with hypertension, hyperlipidemia, prediabetes, asthma, anxiety and edema here for follow-up. She was initially seen 01/2020 for the evaluation of tachycardia and family history of CAD.  She saw Ms.Worley on 10/2019 and noted that her heart rate was sporadically over 120 at rest.  She received notifications from her [employer] watch.  She note that her heart rate has been fast since her teens.  Her pediatrician noted that it was due to her ADHD medication.  Her heart rate came down the 90s when it was held.  She started back on the medication and her rate has been consistently in the 100s since then. She was minimally bothered by the symptoms. She had an echocardiogram 01/2020 that revealed LVEF 60 to 65% with normal diastolic function. She also reported lower extremity edema but there is no evidence of volume overload on her echo. She reported some bradycardia and dizziness after her COVID-19 vaccine.  She had sinus tachycardia thought to be due to her ADHD meds. She was started on metoprolol for her symptoms.  She developed a cough after starting lisinopril so was stopped and switched to 50 mg losartan by her PCP. Her blood pressure was uncontrolled so losartan was switched to valsartan. She followed up with Laurann Montana, NP, and her blood pressure was better controlled but her diastolics were in the 462V. Chlorthalidone was added.   At her last visit, she was doing better but also reported blood pressures as high as 035 systolic. Her home blood pressures were well controlled. She had a gastric sleeve operation 02/2022. Valsartan was switched to irbesartan due to the size of the tablet. She has had considerable weight loss, and blood pressures  were low so irbesartan was discontinued.  Today, the patient states that she is feeling well and denies recurring  palpitations or chest pressure. She is continuing the use of metropolol but reduced her dose to '25mg'$  because of drowsiness. She was suddenly falling asleep while at work. Her blood pressure at home has been controlled, it was 117/64 on Saturday and 126/70 3 weeks ago. In clinic today, her blood pressure was 122/84 on recheck. For exercise she does two laps around work at lunch time and does 30 minutes on the treadmill before and after work, Monday - Friday. She is also able to walk up stairs without being short of breath. She is continuing to lose weight. She denies any shortness of breath, or peripheral edema. No lightheadedness, headaches, syncope, orthopnea, or PND.   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   Depression    Edema, lower extremity    Essential hypertension 06/29/2018   Lisinopril-hydrochlorothiazide 20-25 mg   Heart murmur    Hyperhidrosis 05/29/2021   Hyperlipidemia    Has never been on medication for this   Hypertension    Lisinopril-HCTZ   IBS (irritable bowel syndrome)    Intertrigo 05/29/2021   Intrinsic atopic dermatitis 05/28/2021   Lactose intolerance  Migraines    well-controlled when blood pressure is controlled   Morbid obesity ([place]) 02/07/2020   Palpitations 02/07/2020   PCOS (polycystic ovarian syndrome)    10 years; was on metformin for 2 months but didn't like the way it made her feel; has never been on spironolactone   Pre-diabetes    Hemoglobin A1c 6.0 in September 2019   Pure hypercholesterolemia 02/07/2020    Past Surgical History:  Procedure Laterality Date   COLONOSCOPY  2022   ESOPHAGOGASTRODUODENOSCOPY ENDOSCOPY     UPPER GI ENDOSCOPY N/A  03/19/2022   Procedure: UPPER GI ENDOSCOPY;  Surgeon: Mickeal Skinner, MD;  Location: [facility];  Service: General;  Laterality: N/A;   WISDOM TOOTH EXTRACTION     Feb 2017     Current Outpatient Medications  Medication Sig Dispense Refill   metoprolol succinate (TOPROL-XL) 25 MG 24 hr tablet Take 25 mg by mouth daily.     aluminum chloride (DRYSOL) 20 % external solution Apply to underarms daily to every other day under occlusion as tolerated (Patient taking differently: Apply 1 Application topically daily as needed (sweating).) 60 mL 5   buPROPion (WELLBUTRIN XL) 300 MG 24 hr tablet Take 1 tablet (300 mg total) by mouth daily. 90 tablet 1   clobetasol ointment (TEMOVATE) 0.05 % Apply to the affected area of scalp 4-5 times weekly. (Patient taking differently: 1 Application daily as needed (eczema).) 60 g 3   Dupilumab (DUPIXENT) 300 MG/2ML SOPN Inject 2 mLs (300 mg total) into the skin every 14 days. 4 mL 7   fluticasone (FLONASE) 50 MCG/ACT nasal spray Place 2 sprays into both nostrils daily. 16 g 6   hydrocortisone 2.5 % cream Apply to the face twice daily. (Patient taking differently: 1 Application 2 (two) times daily as needed (eczema).) 453.6 g 2   Methylphenidate HCl ER, PM, (JORNAY PM) 60 MG CP24 Take one capsule (60 mg) by mouth at bedtime. 30 capsule 0   Methylphenidate HCl ER, PM, (JORNAY PM) 60 MG CP24 Take 1 capsule by mouth at bedtime. 30 capsule 0   norgestimate-ethinyl estradiol (VYLIBRA) 0.25-35 MG-MCG tablet Take 1 tablet by mouth daily. 84 tablet 3   nystatin (MYCOSTATIN/NYSTOP) powder Apply under the breasts daily for 10 days as needed for flares of intertrigo. 60 g 5   tacrolimus (PROTOPIC) 0.1 % ointment Apply once daily to the affected areas. (Patient taking differently: Apply 1 Application topically daily as needed (eczema).) 60 g 3   Current Facility-Administered Medications  Medication Dose Route Frequency Provider Last Rate Last Admin   0.9 %  sodium chloride  infusion  500 mL Intravenous Continuous Danis, Kirke Corin, MD        Allergies:   Elderberry, Amlodipine, Hydrocodone, and Lisinopril    Social History:  The patient  reports that she has never smoked. She has never used smokeless tobacco. She reports current alcohol use. She reports that she does not use drugs.   Family History:  The patient's family history includes Anxiety disorder in her mother; Atrial fibrillation in her father; Breast cancer in an other family member; Colon cancer in her maternal grandfather; Colon polyps in her mother; Congestive Heart Failure in her father; Depression in her mother; Diabetes in her father and mother; Esophageal cancer in her maternal grandmother and maternal uncle; Heart attack in her father; Heart disease in her father; Heart failure in her mother; High Cholesterol in her mother; Hypertension in her father, maternal grandfather, maternal grandmother, mother, paternal  grandfather, and paternal grandmother; Obesity in her mother; Ovarian cancer in an other family member; Prostate cancer in her paternal grandfather; Uterine cancer in an other family member.    ROS:   Please see the history of present illness.  All other systems are reviewed and negative.    PHYSICAL EXAM: VS:  BP 122/84 (BP Location: Right Arm, Patient Position: Sitting, Cuff Size: Large)   Pulse 95   Ht 5' 7.5" (1.715 m)   Wt 254 lb 9.6 oz (115.5 kg)   BMI 39.29 kg/m  , BMI Body mass index is 39.29 kg/m. GENERAL:  Well appearing HEENT: Pupils equal round and reactive, fundi not visualized, oral mucosa unremarkable NECK:  No jugular venous distention, waveform within normal limits, carotid upstroke brisk and symmetric, no bruits, no thyromegaly LUNGS:  Clear to auscultation bilaterally HEART:  RRR.  PMI not displaced or sustained,S1 and S2 within normal limits, no S3, no S4, no clicks, no rubs, no murmurs ABD:  Flat, positive bowel sounds normal in frequency in pitch, no bruits,  no rebound, no guarding, no midline pulsatile mass, no hepatomegaly, no splenomegaly EXT:  2 plus pulses throughout, no edema, no cyanosis no clubbing SKIN:  No rashes no nodules NEURO:  Cranial nerves II through XII grossly intact, motor grossly intact throughout PSYCH:  Cognitively intact, oriented to person place and time  EKG:  EKG is personally reviewed. 07/18/2022: Sinus rhythm. Rate 95 bpm.  03/18/22: EKG was not ordered.   10/31/2021:  sinus tachycardia rate-111 bpm The ekg ordered 02/07/20 demonstrates sinus tachycardia.  Rate 109 bpm.   Other studies reviewed:  Echo 02/25/2020:   1. Normal coronary artery origins.   2. Left ventricular ejection fraction, by estimation, is 60 to 65%. The  left ventricle has normal function. The left ventricle has no regional  wall motion abnormalities. Left ventricular diastolic parameters were  normal.   3. Right ventricular systolic function is normal. The right ventricular  size is normal.   4. The mitral valve is normal in structure. Trivial mitral valve  regurgitation. No evidence of mitral stenosis.   5. The aortic valve is normal in structure. Aortic valve regurgitation is  not visualized. No aortic stenosis is present.   6. The inferior vena cava is normal in size with greater than 50%  respiratory variability, suggesting right atrial pressure of 3 mmHg.   Recent Labs: 03/19/2022: ALT 31; BUN 12; Creatinine, Ser 1.07; Potassium 3.6; Sodium 140 03/20/2022: Hemoglobin 13.3; Platelets 209    Lipid Panel    Component Value Date/Time   CHOL 166 02/21/2021 1114   CHOL 177 01/25/2019 1145   TRIG 91.0 02/21/2021 1114   HDL 47.70 02/21/2021 1114   HDL 54 01/25/2019 1145   CHOLHDL 3 02/21/2021 1114   VLDL 18.2 02/21/2021 1114   LDLCALC 100 (H) 02/21/2021 1114   LDLCALC 109 (H) 01/25/2019 1145      Wt Readings from Last 3 Encounters:  07/18/22 254 lb 9.6 oz (115.5 kg)  05/14/22 263 lb 3.2 oz (119.4 kg)  04/03/22 278 lb (126.1 kg)       ASSESSMENT AND PLAN:  Palpitations Symptoms have been controlled.  She reduced metoprolol to '25mg'$  due to drowsiness.    Essential hypertension Blood pressure has been controlled at home.  It has been in the 110-120s/60-70s.  She is doing a great job with exercising.  BP was initially elevated but better on repeat.  Continue metoprolol. She is no longer on irbesartan.  Morbid (  severe) obesity due to excess calories [facility]) She is doing a great job with exercise and weight loss post gastric bypass.    Medication Adjustments/Labs and Tests Ordered: Current medicines are reviewed at length with the patient today.  Concerns regarding medicines are outlined above.   Orders Placed This Encounter  Procedures   EKG 12-Lead   No orders of the defined types were placed in this encounter.  Patient Instructions  Medication Instructions:  Your physician recommends that you continue on your current medications as directed. Please refer to the Current Medication list given to you today.   *If you need a refill on your cardiac medications before your next appointment, please call your pharmacy*  Lab Work: NONE  Testing/Procedures: NONE  Follow-Up: At [facility], you and your health needs are our priority.  As part of our continuing mission to provide you with exceptional heart care, we have created designated Provider Care Teams.  These Care Teams include your primary Cardiologist (physician) and Advanced Practice Providers (APPs -  Physician Assistants and Nurse Practitioners) who all work together to provide you with the care you need, when you need it.  We recommend signing up for the patient portal called "MyChart".  Sign up information is provided on this After Visit Summary.  MyChart is used to connect with patients for Virtual Visits (Telemedicine).  Patients are able to view lab/test results, encounter notes, upcoming appointments, etc.  Non-urgent messages can be sent to  your provider as well.   To learn more about what you can do with MyChart, go to [email or web address].    Your next appointment:   12 month(s)  The format for your next appointment:   In Person  Provider:   Skeet Latch, MD                   Disposition: FU with Tiffany C. Oval Linsey, MD, [facility] in 1 year.  I,Jada L Brogden,acting as a scribe for Skeet Latch, MD.,have documented all relevant documentation on the behalf of Skeet Latch, MD,as directed by  Skeet Latch, MD while in the presence of Skeet Latch, MD.   I, [place] Oval Linsey, MD have reviewed all documentation for this visit.  The documentation of the exam, diagnosis, procedures, and orders on 07/18/2022 are all accurate and complete.   Signed, Tiffany C. Oval Linsey, MD, [facility]  07/18/2022 9:26 AM    [facility]

## 2022-07-18 NOTE — Assessment & Plan Note (Signed)
Symptoms have been controlled.  She reduced metoprolol to '25mg'$  due to drowsiness.

## 2022-07-18 NOTE — Assessment & Plan Note (Signed)
She is doing a great job with exercise and weight loss post gastric bypass.

## 2022-07-24 ENCOUNTER — Other Ambulatory Visit: Payer: Self-pay

## 2022-07-24 ENCOUNTER — Other Ambulatory Visit (HOSPITAL_COMMUNITY): Payer: Self-pay

## 2022-07-26 ENCOUNTER — Other Ambulatory Visit (HOSPITAL_COMMUNITY): Payer: Self-pay

## 2022-08-06 ENCOUNTER — Ambulatory Visit: Payer: 59

## 2022-08-06 ENCOUNTER — Other Ambulatory Visit (HOSPITAL_COMMUNITY): Payer: Self-pay

## 2022-08-07 ENCOUNTER — Other Ambulatory Visit (HOSPITAL_COMMUNITY): Payer: Self-pay

## 2022-08-12 ENCOUNTER — Other Ambulatory Visit: Payer: Self-pay

## 2022-08-14 ENCOUNTER — Encounter: Payer: Commercial Managed Care - PPO | Attending: General Surgery | Admitting: Skilled Nursing Facility1

## 2022-08-14 ENCOUNTER — Encounter: Payer: Self-pay | Admitting: Skilled Nursing Facility1

## 2022-08-14 NOTE — Progress Notes (Signed)
Bariatric Nutrition Follow-Up Visit Medical Nutrition Therapy  Appt Start Time: 8:00 am   End Time: 8:31 am   Surgery date: 03/19/2022 Surgery type: sleeve  NUTRITION ASSESSMENT   Anthropometrics  Start weight at [facility]: 292.5 lbs (date: 09/06/2021)  Height: 67.5 in Weight today: 251.6 BMI: 39.1 kg/m2     Clinical  Medical hx: IBS-C/D, PCOS, depression, anxiety, HTN, hyperlipidemia  Medications: see list  Labs: vitamin b12 1480, cholesterol 201, LDL 126, BUN5, BUN/creatinine ratio 6 Notable signs/symptoms: cluster headaches  Any previous deficiencies? Yes: vitamin D, vitamin B12    Body Composition Scale 04/03/2022 05/14/2022 08/14/2022  Current Body Weight 278 263.2 251.6  Total Body Fat % 44.9 43.5 42.6  Visceral Fat '13 12 11  '$ Fat-Free Mass % 55 56.4 57.3   Total Body Water % 42 42.7 43.1  Muscle-Mass lbs 35.9 35.7 35.3  BMI 42.8 40.4 39.1  Body Fat Displacement            Torso  lbs 77.5 71.1 66.5         Left Leg  lbs 15.5 14.2 13.3         Right Leg  lbs 15.5 14.2 13.3         Left Arm  lbs 7.7 7.1 6.6         Right Arm   lbs 7.7 7.1 6.6     Lifestyle & Dietary Hx  Pt states she tried a small amount of different foods over the holidays. Pt states she has been using Pinterest to get some new recipe ideas because she felt like she was eating the same foods over and over again.  Pt states her water intake has increased to 4-5 bottles a day.  Pt states her energy level has fluctuated. Pt states she feels like it has come from the season change and her depression. Pt states she has been going to [employer] with her mother.  Pt states after walking at lunch time at work she often feels energized afterwards.  Pt states she has had some bowel troubles, but thinks it has been due to IBS. Pt states she is in a good place and feels comfortable waiting 6 months until her next appointment.    Estimated daily fluid intake: 64+ oz Estimated daily protein intake: 45-50  g Supplements: multivitamin and calcium Current average weekly physical activity: [employer] treadmill and elliptical 30-45 minutes 3 times a week, and walk at job every day at lunch (15-20 minutes).   24-Hr Dietary Recall First Meal: 1 scrambled egg and two sausage links (avocado oil) + water Snack: trail mix (pecans + peanuts + cashews + hazelnuts) Second Meal: pork stir fry (pork + snap peas + broccoli + carrots + mushrooms + bell peppers) Snack: trail mix (pecans + peanuts + cashews + hazelnuts) Third Meal: pork stir fry (pork + snap peas + broccoli + carrots + mushrooms + bell peppers) Snack: None reported. Beverages: water  Post-Op Goals/ Signs/ Symptoms Using straws: no Drinking while eating: no Chewing/swallowing difficulties: no Changes in vision: no Changes to mood/headaches: no Hair loss/changes to skin/nails: no Difficulty focusing/concentrating: no Sweating: no Limb weakness: no Dizziness/lightheadedness: no Palpitations: no  Carbonated/caffeinated beverages: no N/V/D/C/Gas: no Abdominal pain: no Dumping syndrome: no    NUTRITION DIAGNOSIS  Overweight/obesity ([place]-3.3) related to past poor dietary habits and physical inactivity as evidenced by completed bariatric surgery and following dietary guidelines for continued weight loss and healthy nutrition status.     NUTRITION INTERVENTION  Nutrition counseling (C-1) and education (E-2) to facilitate bariatric surgery goals, including: The importance of consuming adequate calories as well as certain nutrients daily due to the body's need for essential vitamins, minerals, and fats. The importance of daily physical activity and to reach a goal of at least 150 minutes of moderate to vigorous physical activity weekly (or as directed by their physician) due to benefits such as increased musculature and improved lab values. The importance of intuitive eating specifically learning hunger-satiety cues and understanding the  importance of learning a new body: The importance of mindful eating to avoid grazing behaviors. Why you need complex carbohydrates: Whole grains and other complex carbohydrates are required to have a healthy diet. Whole grains provide fiber which can help with blood glucose levels and help keep you satiated. Fruits and starchy vegetables provide essential vitamins and minerals required for immune function, eyesight support, brain support, bone density, wound healing and many other functions within the body. According to the current evidenced based 2020-2025 Dietary Guidelines for Americans, complex carbohydrates are part of a healthy eating pattern which is associated with a decreased risk for type 2 diabetes, cancers, and cardiovascular disease.    Handouts Provided Include  6 month bariatric maintenance plan   Learning Style & Readiness for Change Teaching method utilized: Visual & Auditory  Demonstrated degree of understanding via: Teach Back  Readiness Level: Action Barriers to learning/adherence to lifestyle change: none identified  RD's Notes for Next Visit Assess adherence to pt chosen goals   MONITORING & EVALUATION Dietary intake, weekly physical activity, body weight.  Next Steps Patient is to follow-up in 6 months for 1 year post-op.

## 2022-08-28 ENCOUNTER — Other Ambulatory Visit (HOSPITAL_COMMUNITY): Payer: Self-pay

## 2022-08-28 ENCOUNTER — Other Ambulatory Visit: Payer: Self-pay

## 2022-08-29 DIAGNOSIS — L2084 Intrinsic (allergic) eczema: Secondary | ICD-10-CM | POA: Diagnosis not present

## 2022-08-29 DIAGNOSIS — L668 Other cicatricial alopecia: Secondary | ICD-10-CM | POA: Diagnosis not present

## 2022-08-29 DIAGNOSIS — R61 Generalized hyperhidrosis: Secondary | ICD-10-CM | POA: Diagnosis not present

## 2022-09-03 ENCOUNTER — Encounter: Payer: Commercial Managed Care - PPO | Admitting: Physician Assistant

## 2022-09-03 DIAGNOSIS — F900 Attention-deficit hyperactivity disorder, predominantly inattentive type: Secondary | ICD-10-CM | POA: Diagnosis not present

## 2022-09-03 DIAGNOSIS — Z79899 Other long term (current) drug therapy: Secondary | ICD-10-CM | POA: Diagnosis not present

## 2022-09-04 ENCOUNTER — Other Ambulatory Visit (HOSPITAL_COMMUNITY): Payer: Self-pay

## 2022-09-09 ENCOUNTER — Other Ambulatory Visit (HOSPITAL_COMMUNITY): Payer: Self-pay

## 2022-09-11 ENCOUNTER — Other Ambulatory Visit: Payer: Self-pay

## 2022-09-19 DIAGNOSIS — E569 Vitamin deficiency, unspecified: Secondary | ICD-10-CM | POA: Diagnosis not present

## 2022-09-19 DIAGNOSIS — I10 Essential (primary) hypertension: Secondary | ICD-10-CM | POA: Diagnosis not present

## 2022-09-19 DIAGNOSIS — K912 Postsurgical malabsorption, not elsewhere classified: Secondary | ICD-10-CM | POA: Diagnosis not present

## 2022-09-19 DIAGNOSIS — Z9884 Bariatric surgery status: Secondary | ICD-10-CM | POA: Diagnosis not present

## 2022-09-25 ENCOUNTER — Other Ambulatory Visit (HOSPITAL_COMMUNITY): Payer: Self-pay

## 2022-09-27 ENCOUNTER — Other Ambulatory Visit (HOSPITAL_COMMUNITY): Payer: Self-pay

## 2022-09-27 MED ORDER — JORNAY PM 60 MG PO CP24
60.0000 mg | ORAL_CAPSULE | Freq: Every evening | ORAL | 0 refills | Status: DC
  Filled 2022-09-27: qty 30, 30d supply, fill #0

## 2022-10-01 ENCOUNTER — Ambulatory Visit (INDEPENDENT_AMBULATORY_CARE_PROVIDER_SITE_OTHER): Payer: Commercial Managed Care - PPO | Admitting: Physician Assistant

## 2022-10-01 ENCOUNTER — Encounter: Payer: Self-pay | Admitting: Physician Assistant

## 2022-10-01 ENCOUNTER — Other Ambulatory Visit: Payer: Self-pay

## 2022-10-01 VITALS — BP 120/84 | HR 95 | Temp 97.5°F | Ht 67.5 in | Wt 252.0 lb

## 2022-10-01 DIAGNOSIS — Z0000 Encounter for general adult medical examination without abnormal findings: Secondary | ICD-10-CM | POA: Diagnosis not present

## 2022-10-01 DIAGNOSIS — Z1159 Encounter for screening for other viral diseases: Secondary | ICD-10-CM | POA: Diagnosis not present

## 2022-10-01 DIAGNOSIS — F3289 Other specified depressive episodes: Secondary | ICD-10-CM

## 2022-10-01 DIAGNOSIS — Z9884 Bariatric surgery status: Secondary | ICD-10-CM | POA: Diagnosis not present

## 2022-10-01 DIAGNOSIS — F419 Anxiety disorder, unspecified: Secondary | ICD-10-CM

## 2022-10-01 DIAGNOSIS — Z114 Encounter for screening for human immunodeficiency virus [HIV]: Secondary | ICD-10-CM | POA: Diagnosis not present

## 2022-10-01 DIAGNOSIS — I10 Essential (primary) hypertension: Secondary | ICD-10-CM | POA: Diagnosis not present

## 2022-10-01 LAB — CBC WITH DIFFERENTIAL/PLATELET
Basophils Absolute: 0.1 10*3/uL (ref 0.0–0.1)
Basophils Relative: 0.7 % (ref 0.0–3.0)
Eosinophils Absolute: 0.1 10*3/uL (ref 0.0–0.7)
Eosinophils Relative: 0.7 % (ref 0.0–5.0)
HCT: 41.4 % (ref 36.0–46.0)
Hemoglobin: 13.5 g/dL (ref 12.0–15.0)
Lymphocytes Relative: 28.1 % (ref 12.0–46.0)
Lymphs Abs: 3 10*3/uL (ref 0.7–4.0)
MCHC: 32.6 g/dL (ref 30.0–36.0)
MCV: 87.8 fl (ref 78.0–100.0)
Monocytes Absolute: 0.5 10*3/uL (ref 0.1–1.0)
Monocytes Relative: 4.6 % (ref 3.0–12.0)
Neutro Abs: 7.1 10*3/uL (ref 1.4–7.7)
Neutrophils Relative %: 65.9 % (ref 43.0–77.0)
Platelets: 204 10*3/uL (ref 150.0–400.0)
RBC: 4.72 Mil/uL (ref 3.87–5.11)
RDW: 14.7 % (ref 11.5–15.5)
WBC: 10.8 10*3/uL — ABNORMAL HIGH (ref 4.0–10.5)

## 2022-10-01 LAB — IBC + FERRITIN
Ferritin: 32.4 ng/mL (ref 10.0–291.0)
Iron: 67 ug/dL (ref 42–145)
Saturation Ratios: 17.2 % — ABNORMAL LOW (ref 20.0–50.0)
TIBC: 389.2 ug/dL (ref 250.0–450.0)
Transferrin: 278 mg/dL (ref 212.0–360.0)

## 2022-10-01 LAB — COMPREHENSIVE METABOLIC PANEL
ALT: 5 U/L (ref 0–35)
AST: 10 U/L (ref 0–37)
Albumin: 3.6 g/dL (ref 3.5–5.2)
Alkaline Phosphatase: 63 U/L (ref 39–117)
BUN: 8 mg/dL (ref 6–23)
CO2: 29 mEq/L (ref 19–32)
Calcium: 8.9 mg/dL (ref 8.4–10.5)
Chloride: 105 mEq/L (ref 96–112)
Creatinine, Ser: 0.77 mg/dL (ref 0.40–1.20)
GFR: 101.09 mL/min (ref >60.00)
Glucose, Bld: 76 mg/dL (ref 70–99)
Potassium: 4.1 mEq/L (ref 3.5–5.1)
Sodium: 141 mEq/L (ref 135–145)
Total Bilirubin: 0.3 mg/dL (ref 0.2–1.2)
Total Protein: 6.8 g/dL (ref 6.0–8.3)

## 2022-10-01 LAB — LIPID PANEL
Cholesterol: 197 mg/dL (ref 0–200)
HDL: 65.5 mg/dL (ref >39.00)
LDL Cholesterol: 109 mg/dL — ABNORMAL HIGH (ref 0–99)
NonHDL: 131.59
Total CHOL/HDL Ratio: 3
Triglycerides: 114 mg/dL (ref 0.0–149.0)
VLDL: 22.8 mg/dL (ref 0.0–40.0)

## 2022-10-01 LAB — VITAMIN B12: Vitamin B-12: 480 pg/mL (ref 211–911)

## 2022-10-01 LAB — HEMOGLOBIN A1C: Hgb A1c MFr Bld: 5.6 % (ref 4.6–6.5)

## 2022-10-01 LAB — TSH: TSH: 0.87 u[IU]/mL (ref 0.35–5.50)

## 2022-10-01 MED ORDER — CITALOPRAM HYDROBROMIDE 10 MG PO TABS
10.0000 mg | ORAL_TABLET | Freq: Every day | ORAL | 3 refills | Status: DC
  Filled 2022-10-01: qty 30, 30d supply, fill #0
  Filled 2022-10-29: qty 30, 30d supply, fill #1
  Filled 2022-12-03: qty 30, 30d supply, fill #2
  Filled 2023-01-02: qty 30, 30d supply, fill #3

## 2022-10-01 NOTE — Patient Instructions (Addendum)
It was great to see you!  Stop Wellbutrin and start celexa 10 mg Message me in a month to check in   Please go to the lab for blood work.   Our office will call you with your results unless you have chosen to receive results via MyChart.  If your blood work is normal we will follow-up each year for physicals and as scheduled for chronic medical problems.  If anything is abnormal we will treat accordingly and get you in for a follow-up.  Take care,  Aldona Bar

## 2022-10-01 NOTE — Progress Notes (Signed)
Subjective:    Madison Robertson is a 32 y.o. female and is here for a comprehensive physical exam.  Depression        Associated symptoms include no myalgias and no headaches.  Past medical history includes anxiety.   Anxiety Patient reports no chest pain, dizziness, nausea, nervous/anxious behavior or palpitations.      Health Maintenance Due  Topic Date Due   HIV Screening  Never done   Hepatitis C Screening  Never done    Acute Concerns: None  Chronic Issues: Anxiety and Depression Currently taking Wellbutrin 300 mg XL daily Actually went 1-2 weeks without taking it and didn't know Zoloft -- stomach issues Prozac -- side effects  ADHD Currently seeing [facility] Currently taking Jornay PM and tolerating well  HTN Currently taking no medication. At home blood pressure readings are: not checked. Patient denies chest pain, SOB, blurred vision, dizziness, unusual headaches, lower leg swelling. Patient is compliant with medication. Denies excessive caffeine intake, stimulant usage, excessive alcohol intake, or increase in salt consumption.  BP Readings from Last 3 Encounters:  10/01/22 120/84  07/18/22 122/84  03/20/22 129/71   Health Maintenance: Immunizations -- UTD Colonoscopy -- UTD Mammogram -- n/a PAP -- UTD Bone Density -- n/a Diet -- continues to work on healthy diet and exercise as able Exercise -- walking around building at work; [employer] -- see above  UTD with dentist? - yes UTD with eye doctor? - no  Weight history: Wt Readings from Last 10 Encounters:  10/01/22 252 lb (114.3 kg)  08/14/22 251 lb 9.6 oz (114.1 kg)  07/18/22 254 lb 9.6 oz (115.5 kg)  05/14/22 263 lb 3.2 oz (119.4 kg)  04/03/22 278 lb (126.1 kg)  03/19/22 294 lb 6.4 oz (133.5 kg)  03/18/22 294 lb 6.4 oz (133.5 kg)  03/08/22 295 lb (133.8 kg)  02/25/22 (!) 302 lb 8 oz (137.2 kg)  12/17/21 (!) 301 lb (136.5 kg)   Body mass index is 38.89  kg/m. Patient's last menstrual period was 09/15/2022 (exact date).  Alcohol use:  reports current alcohol use.  Tobacco use:  Tobacco Use: Low Risk  (10/01/2022)   Patient History    Smoking Tobacco Use: Never    Smokeless Tobacco Use: Never    Passive Exposure: Not on file   Eligible for lung cancer screening? no     10/01/2022    8:21 AM  Depression screen PHQ 2/9  Decreased Interest 3  Down, Depressed, Hopeless 2  PHQ - 2 Score 5  Altered sleeping 1  Tired, decreased energy 2  Change in appetite 1  Feeling bad or failure about yourself  1  Trouble concentrating 1  Moving slowly or fidgety/restless 1  Suicidal thoughts 0  PHQ-9 Score 12  Difficult doing work/chores Very difficult     Other providers/specialists: Patient Care Team: Inda Coke, [place] as PCP - General (Physician Assistant) Skeet Latch, MD as PCP - Cardiology (Cardiology) Loletha Carrow Kirke Corin, MD as Consulting Physician (Gastroenterology)    PMHx, SurgHx, SocialHx, Medications, and Allergies were reviewed in the Visit Navigator and updated as appropriate.   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   Depression    Edema, lower extremity    Essential  hypertension 06/29/2018   Lisinopril-hydrochlorothiazide 20-25 mg   Heart murmur    Hyperhidrosis 05/29/2021   Hyperlipidemia    Has never been on medication for this   Hypertension    Lisinopril-HCTZ   IBS (irritable bowel syndrome)    Intertrigo 05/29/2021   Intrinsic atopic dermatitis 05/28/2021   Lactose intolerance    Migraines    well-controlled when blood pressure is controlled   Morbid obesity ([place]) 02/07/2020   Palpitations 02/07/2020   PCOS (polycystic ovarian syndrome)    10 years; was on metformin for 2 months but didn't like the way it made her  feel; has never been on spironolactone   Pre-diabetes    Hemoglobin A1c 6.0 in September 2019   Pure hypercholesterolemia 02/07/2020     Past Surgical History:  Procedure Laterality Date   COLONOSCOPY  2022   ESOPHAGOGASTRODUODENOSCOPY ENDOSCOPY     UPPER GI ENDOSCOPY N/A 03/19/2022   Procedure: UPPER GI ENDOSCOPY;  Surgeon: Kieth Brightly, Arta Bruce, MD;  Location: [facility];  Service: General;  Laterality: N/A;   [place] EXTRACTION     Feb 2017     Family History  Problem Relation Age of Onset   Diabetes Mother    Hypertension Mother    Colon polyps Mother    High Cholesterol Mother    Depression Mother    Anxiety disorder Mother    Obesity Mother    Heart failure Mother    Diabetes Father    Hypertension Father    Congestive Heart Failure Father    Heart disease Father    Atrial fibrillation Father    Heart attack Father    Prostate cancer Paternal Grandfather    Hypertension Paternal Grandfather    Breast cancer Other        Paternal great aunt,distal cousin,    Ovarian cancer Other        Paternal great grandmother   Uterine cancer Other    Colon cancer Maternal Grandfather    Hypertension Maternal Grandfather    Esophageal cancer Maternal Grandmother    Hypertension Maternal Grandmother    Esophageal cancer Maternal Uncle    Hypertension Paternal Grandmother    Pancreatic cancer Neg Hx    Rectal cancer Neg Hx    Stomach cancer Neg Hx     Social History   Tobacco Use   Smoking status: Never   Smokeless tobacco: Never  Vaping Use   Vaping Use: Never used  Substance Use Topics   Alcohol use: Yes    Comment: occassionally   Drug use: No    Review of Systems:   Review of Systems  Constitutional:  Negative for chills, fever, malaise/fatigue and weight loss.  HENT:  Negative for hearing loss, sinus pain and sore throat.   Respiratory:  Negative for cough and hemoptysis.   Cardiovascular:  Negative for chest pain, palpitations, leg swelling and PND.   Gastrointestinal:  Negative for abdominal pain, constipation, diarrhea, heartburn, nausea and vomiting.  Genitourinary:  Negative for dysuria, frequency and urgency.  Musculoskeletal:  Negative for back pain, myalgias and neck pain.  Skin:  Negative for itching and rash.  Neurological:  Negative for dizziness, tingling, seizures and headaches.  Endo/Heme/Allergies:  Negative for polydipsia.  Psychiatric/Behavioral:  Negative for depression. The patient is not nervous/anxious.     Objective:   BP 120/84 (BP Location: Left Arm, Patient Position: Sitting, Cuff Size: Large)   Pulse 95   Temp (!) 97.5 F (36.4 C) (Temporal)   Ht  5' 7.5" (1.715 m)   Wt 252 lb (114.3 kg)   LMP 09/15/2022 (Exact Date)   SpO2 98%   BMI 38.89 kg/m  Body mass index is 38.89 kg/m.   General Appearance:    Alert, cooperative, no distress, appears stated age  Head:    Normocephalic, without obvious abnormality, atraumatic  Eyes:    PERRL, conjunctiva/corneas clear, EOM's intact, fundi    benign, both eyes  Ears:    Normal TM's and external ear canals, both ears  Nose:   Nares normal, septum midline, mucosa normal, no drainage    or sinus tenderness  Throat:   Lips, mucosa, and tongue normal; teeth and gums normal  Neck:   Supple, symmetrical, trachea midline, no adenopathy;    thyroid:  no enlargement/tenderness/nodules; no carotid   bruit or JVD  Back:     Symmetric, no curvature, ROM normal, no CVA tenderness  Lungs:     Clear to auscultation bilaterally, respirations unlabored  Chest Wall:    No tenderness or deformity   Heart:    Regular rate and rhythm, S1 and S2 normal, no murmur, rub or gallop  Breast Exam:    Deferred   Abdomen:     Soft, non-tender, bowel sounds active all four quadrants,    no masses, no organomegaly  Genitalia:    Deferred   Extremities:   Extremities normal, atraumatic, no cyanosis or edema  Pulses:   2+ and symmetric all extremities  Skin:   Skin color, texture, turgor  normal, no rashes or lesions  Lymph nodes:   Cervical, supraclavicular, and axillary nodes normal  Neurologic:   CNII-XII intact, normal strength, sensation and reflexes    throughout    Assessment/Plan:   Routine physical examination Today patient counseled on age appropriate routine health concerns for screening and prevention, each reviewed and up to date or declined. Immunizations reviewed and up to date or declined. Labs ordered and reviewed. Risk factors for depression reviewed and negative. Hearing function and visual acuity are intact. ADLs screened and addressed as needed. Functional ability and level of safety reviewed and appropriate. Education, counseling and referrals performed based on assessed risks today. Patient provided with a copy of personalized plan for preventive services.  Other depression; Anxiety Uncontrolled Stop Wellbutrin 300 mg XL daily Start celexa 10 mg daily Follow-up in 1 month via mychart to check in on symptoms I discussed with patient that if they develop any SI, to tell someone immediately and seek medical attention. Denies SI/HI today  Essential hypertension Normotensive Continue to monitor and follow-up if BP consistently > 140/90  S/P laparoscopic sleeve gastrectomy No red flags on exam Update vitamins/mineral levels Continue to take supp  Encounter for screening for other viral diseases Update Hep C  Screening for HIV (human immunodeficiency virus) Update HIV  Inda Coke, PA-C [place]

## 2022-10-03 ENCOUNTER — Other Ambulatory Visit (HOSPITAL_COMMUNITY): Payer: Self-pay

## 2022-10-06 LAB — HIV ANTIBODY (ROUTINE TESTING W REFLEX)

## 2022-10-06 LAB — TIQ-NTM

## 2022-10-06 LAB — VITAMIN B1: Vitamin B1 (Thiamine): 7 nmol/L — ABNORMAL LOW (ref 8–30)

## 2022-10-06 LAB — HEPATITIS C ANTIBODY

## 2022-10-07 ENCOUNTER — Other Ambulatory Visit (HOSPITAL_COMMUNITY): Payer: Self-pay

## 2022-10-08 ENCOUNTER — Other Ambulatory Visit (HOSPITAL_COMMUNITY): Payer: Self-pay

## 2022-10-24 ENCOUNTER — Other Ambulatory Visit (HOSPITAL_COMMUNITY): Payer: Self-pay

## 2022-10-26 ENCOUNTER — Telehealth: Payer: Commercial Managed Care - PPO | Admitting: Family Medicine

## 2022-10-26 DIAGNOSIS — J069 Acute upper respiratory infection, unspecified: Secondary | ICD-10-CM

## 2022-10-26 MED ORDER — BENZONATATE 200 MG PO CAPS: 200.0000 mg | ORAL_CAPSULE | Freq: Two times a day (BID) | ORAL | 0 refills | Status: DC | PRN

## 2022-10-26 MED ORDER — PREDNISONE 20 MG PO TABS: 20.0000 mg | ORAL_TABLET | Freq: Two times a day (BID) | ORAL | 0 refills | Status: AC

## 2022-10-26 NOTE — Progress Notes (Signed)
E-Visit for Cough  We are sorry that you are not feeling well.  Here is how we plan to help!  Based on your presentation I believe you most likely have A cough due to a virus.  This is called viral bronchitis and is best treated by rest, plenty of fluids and control of the cough.  You may use Ibuprofen or Tylenol as directed to help your symptoms.     In addition you may use A prescription cough medication called Tessalon Perles 100mg . You may take 1-2 capsules every 8 hours as needed for your cough.  Prednisone sent.   From your responses in the eVisit questionnaire you describe inflammation in the upper respiratory tract which is causing a significant cough.  This is commonly called Bronchitis and has four common causes:   Allergies Viral Infections Acid Reflux Bacterial Infection Allergies, viruses and acid reflux are treated by controlling symptoms or eliminating the cause. An example might be a cough caused by taking certain blood pressure medications. You stop the cough by changing the medication. Another example might be a cough caused by acid reflux. Controlling the reflux helps control the cough.  USE OF BRONCHODILATOR ("RESCUE") INHALERS: There is a risk from using your bronchodilator too frequently.  The risk is that over-reliance on a medication which only relaxes the muscles surrounding the breathing tubes can reduce the effectiveness of medications prescribed to reduce swelling and congestion of the tubes themselves.  Although you feel brief relief from the bronchodilator inhaler, your asthma may actually be worsening with the tubes becoming more swollen and filled with mucus.  This can delay other crucial treatments, such as oral steroid medications. If you need to use a bronchodilator inhaler daily, several times per day, you should discuss this with your provider.  There are probably better treatments that could be used to keep your asthma under control.     HOME CARE Only take  medications as instructed by your medical team. Complete the entire course of an antibiotic. Drink plenty of fluids and get plenty of rest. Avoid close contacts especially the very young and the elderly Cover your mouth if you cough or cough into your sleeve. Always remember to wash your hands A steam or ultrasonic humidifier can help congestion.   GET HELP RIGHT AWAY IF: You develop worsening fever. You become short of breath You cough up blood. Your symptoms persist after you have completed your treatment plan MAKE SURE YOU  Understand these instructions. Will watch your condition. Will get help right away if you are not doing well or get worse.    Thank you for choosing an e-visit.  Your e-visit answers were reviewed by a board certified advanced clinical practitioner to complete your personal care plan. Depending upon the condition, your plan could have included both over the counter or prescription medications.  Please review your pharmacy choice. Make sure the pharmacy is open so you can pick up prescription now. If there is a problem, you may contact your provider through [employer] and have the prescription routed to another pharmacy.  Your safety is important to [place]. If you have drug allergies check your prescription carefully.   For the next 24 hours you can use MyChart to ask questions about today's visit, request a non-urgent call back, or ask for a work or school excuse. You will get an email in the next two days asking about your experience. I hope that your e-visit has been valuable and will speed  your recovery.    have provided 5 minutes of non face to face time during this encounter for chart review and documentation.

## 2022-10-28 ENCOUNTER — Other Ambulatory Visit (HOSPITAL_COMMUNITY): Payer: Self-pay

## 2022-10-28 ENCOUNTER — Other Ambulatory Visit: Payer: Self-pay | Admitting: Physician Assistant

## 2022-10-29 ENCOUNTER — Encounter (HOSPITAL_BASED_OUTPATIENT_CLINIC_OR_DEPARTMENT_OTHER): Payer: Self-pay | Admitting: Cardiovascular Disease

## 2022-10-29 ENCOUNTER — Other Ambulatory Visit: Payer: Self-pay

## 2022-10-29 MED ORDER — METOPROLOL SUCCINATE ER 25 MG PO TB24
25.0000 mg | ORAL_TABLET | Freq: Every day | ORAL | 3 refills | Status: DC
  Filled 2022-10-29: qty 90, 90d supply, fill #0
  Filled 2023-02-03: qty 90, 90d supply, fill #1

## 2022-11-01 ENCOUNTER — Other Ambulatory Visit: Payer: Self-pay

## 2022-11-01 ENCOUNTER — Other Ambulatory Visit (HOSPITAL_COMMUNITY): Payer: Self-pay

## 2022-11-01 MED ORDER — JORNAY PM 60 MG PO CP24
60.0000 mg | ORAL_CAPSULE | Freq: Every evening | ORAL | 0 refills | Status: DC
  Filled 2022-11-01 (×2): qty 30, 30d supply, fill #0

## 2022-11-04 ENCOUNTER — Other Ambulatory Visit (HOSPITAL_COMMUNITY): Payer: Self-pay

## 2022-11-05 ENCOUNTER — Other Ambulatory Visit: Payer: Self-pay

## 2022-11-06 ENCOUNTER — Other Ambulatory Visit: Payer: Self-pay

## 2022-11-12 ENCOUNTER — Other Ambulatory Visit (HOSPITAL_COMMUNITY): Payer: Self-pay

## 2022-11-28 ENCOUNTER — Other Ambulatory Visit (HOSPITAL_COMMUNITY): Payer: Self-pay

## 2022-11-28 ENCOUNTER — Telehealth: Payer: Self-pay | Admitting: Pharmacist

## 2022-11-28 NOTE — Telephone Encounter (Signed)
Called patient to schedule an appointment for the [employer] [facility]. I was unable to reach the patient so I left a HIPAA-compliant message requesting that the patient return my call.   Luke Van Ausdall, PharmD, BCACP, CPP Clinical Pharmacist [facility] [phone]  

## 2022-12-03 ENCOUNTER — Other Ambulatory Visit: Payer: Self-pay

## 2022-12-03 ENCOUNTER — Other Ambulatory Visit (HOSPITAL_COMMUNITY): Payer: Self-pay

## 2022-12-03 MED ORDER — JORNAY PM 60 MG PO CP24
60.0000 mg | ORAL_CAPSULE | Freq: Every evening | ORAL | 0 refills | Status: DC
  Filled 2022-12-03: qty 30, 30d supply, fill #0

## 2022-12-04 ENCOUNTER — Other Ambulatory Visit (HOSPITAL_COMMUNITY): Payer: Self-pay

## 2022-12-04 ENCOUNTER — Other Ambulatory Visit: Payer: Self-pay

## 2022-12-18 ENCOUNTER — Encounter: Payer: Self-pay | Admitting: Physician Assistant

## 2022-12-25 ENCOUNTER — Other Ambulatory Visit (HOSPITAL_COMMUNITY): Payer: Self-pay

## 2023-01-02 ENCOUNTER — Encounter (HOSPITAL_COMMUNITY): Payer: Self-pay

## 2023-01-02 ENCOUNTER — Other Ambulatory Visit (HOSPITAL_COMMUNITY): Payer: Self-pay

## 2023-01-03 ENCOUNTER — Other Ambulatory Visit: Payer: Self-pay

## 2023-01-03 ENCOUNTER — Other Ambulatory Visit (HOSPITAL_COMMUNITY): Payer: Self-pay

## 2023-01-03 MED ORDER — JORNAY PM 60 MG PO CP24
60.0000 mg | ORAL_CAPSULE | Freq: Every evening | ORAL | 0 refills | Status: DC
  Filled 2023-01-03: qty 30, 30d supply, fill #0

## 2023-01-06 ENCOUNTER — Other Ambulatory Visit (HOSPITAL_COMMUNITY): Payer: Self-pay

## 2023-01-07 ENCOUNTER — Other Ambulatory Visit (HOSPITAL_COMMUNITY): Payer: Self-pay

## 2023-01-16 ENCOUNTER — Other Ambulatory Visit (HOSPITAL_COMMUNITY): Payer: Self-pay

## 2023-01-27 ENCOUNTER — Other Ambulatory Visit (HOSPITAL_COMMUNITY): Payer: Self-pay

## 2023-01-28 ENCOUNTER — Other Ambulatory Visit (HOSPITAL_COMMUNITY): Payer: Self-pay

## 2023-01-29 ENCOUNTER — Encounter (HOSPITAL_COMMUNITY): Payer: Self-pay

## 2023-01-29 ENCOUNTER — Other Ambulatory Visit (HOSPITAL_COMMUNITY): Payer: Self-pay

## 2023-01-29 ENCOUNTER — Ambulatory Visit: Payer: Commercial Managed Care - PPO | Attending: Physician Assistant | Admitting: Pharmacist

## 2023-01-29 DIAGNOSIS — Z79899 Other long term (current) drug therapy: Secondary | ICD-10-CM

## 2023-01-29 MED ORDER — DUPIXENT 300 MG/2ML ~~LOC~~ SOAJ
SUBCUTANEOUS | 7 refills | Status: AC
  Filled 2023-01-29: qty 4, 28d supply, fill #0
  Filled 2023-02-24: qty 4, 28d supply, fill #1
  Filled 2023-03-24: qty 4, 28d supply, fill #2

## 2023-01-29 MED ORDER — DUPIXENT 300 MG/2ML ~~LOC~~ SOAJ: SUBCUTANEOUS | 7 refills | Status: DC

## 2023-01-29 NOTE — Progress Notes (Signed)
   S: Patient presents for review of their specialty medication therapy.  Patient is currently taking Dupixent for atopic dermatitis. Patient is managed by Dr. Isaac Laud for this.   Adherence: confirmed  Efficacy:  reports that this medication is working well for her, particularly since having weight loss surgery last year.   Dosing: 300 mg q14days  Dose adjustments: Renal: no dose adjustments (has not been studied) Hepatic: no dose adjustments (has not been studied)  Drug-drug interactions: none identified   Screening: TB test: completed   Monitoring: S/sx of infection: none  S/sx of hypersensitivity: none S/sx of ocular effects: none S/sx of eosinophilia/vasculitis: none  Of note, she does endorse a recent injection where some of the medication leaked out after administration. It has not happened since and she has good efficacy. She continues to monitor.   O:     Lab Results  Component Value Date   WBC 10.8 (H) 10/01/2022   HGB 13.5 10/01/2022   HCT 41.4 10/01/2022   MCV 87.8 10/01/2022   PLT 204.0 10/01/2022      Chemistry      Component Value Date/Time   NA 141 10/01/2022 0912   NA 140 02/04/2022 1345   K 4.1 10/01/2022 0912   CL 105 10/01/2022 0912   CO2 29 10/01/2022 0912   BUN 8 10/01/2022 0912   BUN 7 02/04/2022 1345   CREATININE 0.77 10/01/2022 0912      Component Value Date/Time   CALCIUM 8.9 10/01/2022 0912   ALKPHOS 63 10/01/2022 0912   AST 10 10/01/2022 0912   ALT 5 10/01/2022 0912   BILITOT 0.3 10/01/2022 0912       A/P: 1. Medication review: Patient currently on Dupixent for atopic dermatitis. Reviewed the medication with the patient, including the following: Dupixent is a monoclonal antibody used for the treatment of asthma or atopic dermatitis. Patient educated on purpose, proper use and potential adverse effects of Dupixent. Possible adverse effects include increased risk of infection, ocular effects, vasculitis/eosinophilia, and  hypersensitivity reactions. Administer as a SubQ injection and rotate sites. Allow the medication to reach room temp prior to administration (45 mins for 300 mg syringe or 30 min for 200 mg syringe). Do not shake. Discard any unused portion. No recommendations for any changes.    Butch Penny, PharmD, [place], CPP Clinical Pharmacist [facility] [phone]

## 2023-02-03 ENCOUNTER — Other Ambulatory Visit: Payer: Self-pay | Admitting: Physician Assistant

## 2023-02-04 ENCOUNTER — Other Ambulatory Visit: Payer: Self-pay

## 2023-02-04 ENCOUNTER — Other Ambulatory Visit (HOSPITAL_COMMUNITY): Payer: Self-pay

## 2023-02-04 MED ORDER — CITALOPRAM HYDROBROMIDE 10 MG PO TABS
10.0000 mg | ORAL_TABLET | Freq: Every day | ORAL | 3 refills | Status: DC
  Filled 2023-02-04: qty 30, 30d supply, fill #0

## 2023-02-04 MED ORDER — JORNAY PM 60 MG PO CP24
60.0000 mg | ORAL_CAPSULE | Freq: Every day | ORAL | 0 refills | Status: DC
  Filled 2023-02-04: qty 30, 30d supply, fill #0

## 2023-02-06 ENCOUNTER — Other Ambulatory Visit: Payer: Self-pay | Admitting: Oncology

## 2023-02-06 DIAGNOSIS — Z006 Encounter for examination for normal comparison and control in clinical research program: Secondary | ICD-10-CM

## 2023-02-12 ENCOUNTER — Ambulatory Visit: Payer: Commercial Managed Care - PPO | Admitting: Skilled Nursing Facility1

## 2023-02-19 ENCOUNTER — Other Ambulatory Visit (HOSPITAL_COMMUNITY): Payer: Self-pay

## 2023-02-20 ENCOUNTER — Encounter: Payer: Self-pay | Admitting: Physician Assistant

## 2023-02-21 ENCOUNTER — Other Ambulatory Visit: Payer: Self-pay | Admitting: Physician Assistant

## 2023-02-21 ENCOUNTER — Other Ambulatory Visit: Payer: Self-pay

## 2023-02-21 ENCOUNTER — Other Ambulatory Visit (HOSPITAL_COMMUNITY): Payer: Self-pay

## 2023-02-21 MED ORDER — CITALOPRAM HYDROBROMIDE 20 MG PO TABS
20.0000 mg | ORAL_TABLET | Freq: Every day | ORAL | 1 refills | Status: DC
  Filled 2023-02-21: qty 90, 90d supply, fill #0
  Filled 2023-06-21: qty 30, 30d supply, fill #1
  Filled 2023-07-24: qty 30, 30d supply, fill #2
  Filled 2023-08-29: qty 30, 30d supply, fill #3

## 2023-02-21 NOTE — Telephone Encounter (Signed)
Please advise 

## 2023-02-24 ENCOUNTER — Other Ambulatory Visit (HOSPITAL_COMMUNITY): Payer: Self-pay

## 2023-02-26 ENCOUNTER — Other Ambulatory Visit: Payer: Self-pay

## 2023-03-07 ENCOUNTER — Telehealth: Payer: Commercial Managed Care - PPO | Admitting: Physician Assistant

## 2023-03-07 ENCOUNTER — Other Ambulatory Visit (HOSPITAL_COMMUNITY): Payer: Self-pay

## 2023-03-07 ENCOUNTER — Other Ambulatory Visit: Payer: Self-pay

## 2023-03-07 DIAGNOSIS — F902 Attention-deficit hyperactivity disorder, combined type: Secondary | ICD-10-CM | POA: Diagnosis not present

## 2023-03-07 DIAGNOSIS — J069 Acute upper respiratory infection, unspecified: Secondary | ICD-10-CM

## 2023-03-07 DIAGNOSIS — Z79899 Other long term (current) drug therapy: Secondary | ICD-10-CM | POA: Diagnosis not present

## 2023-03-07 MED ORDER — ALBUTEROL SULFATE HFA 108 (90 BASE) MCG/ACT IN AERS
1.0000 | INHALATION_SPRAY | Freq: Four times a day (QID) | RESPIRATORY_TRACT | 0 refills | Status: DC | PRN
Start: 2023-03-07 — End: 2023-11-03
  Filled 2023-03-07: qty 6.7, 25d supply, fill #0

## 2023-03-07 MED ORDER — JORNAY PM 60 MG PO CP24
60.0000 mg | ORAL_CAPSULE | Freq: Every evening | ORAL | 0 refills | Status: DC
  Filled 2023-03-07 (×2): qty 30, 30d supply, fill #0

## 2023-03-07 MED ORDER — PREDNISONE 10 MG (21) PO TBPK
ORAL_TABLET | ORAL | 0 refills | Status: DC
Start: 2023-03-07 — End: 2023-09-29
  Filled 2023-03-07: qty 21, 6d supply, fill #0

## 2023-03-07 MED ORDER — BENZONATATE 100 MG PO CAPS
100.0000 mg | ORAL_CAPSULE | Freq: Three times a day (TID) | ORAL | 0 refills | Status: DC | PRN
Start: 2023-03-07 — End: 2023-05-20
  Filled 2023-03-07: qty 30, 5d supply, fill #0

## 2023-03-07 NOTE — Progress Notes (Signed)
E-Visit for Cough  We are sorry that you are not feeling well.  Here is how we plan to help!  Based on your presentation I believe you most likely have A cough due to a virus.  This is called viral bronchitis and is best treated by rest, plenty of fluids and control of the cough.  You may use Ibuprofen or Tylenol as directed to help your symptoms.     In addition you may use A non-prescription cough medication called Mucinex DM: take 2 tablets every 12 hours. and A prescription cough medication called Tessalon Perles 100mg . You may take 1-2 capsules every 8 hours as needed for your cough.  Prednisone 10 mg daily for 6 days (see taper instructions below)  Directions for 6 day taper: Day 1: 2 tablets before breakfast, 1 after both lunch & dinner and 2 at bedtime Day 2: 1 tab before breakfast, 1 after both lunch & dinner and 2 at bedtime Day 3: 1 tab at each meal & 1 at bedtime Day 4: 1 tab at breakfast, 1 at lunch, 1 at bedtime Day 5: 1 tab at breakfast & 1 tab at bedtime Day 6: 1 tab at breakfast  I have also sent in Albuterol Use 1-2 puffs every 4-6 hours as needed for wheezing and/or shortness of breath  From your responses in the eVisit questionnaire you describe inflammation in the upper respiratory tract which is causing a significant cough.  This is commonly called Bronchitis and has four common causes:   Allergies Viral Infections Acid Reflux Bacterial Infection Allergies, viruses and acid reflux are treated by controlling symptoms or eliminating the cause. An example might be a cough caused by taking certain blood pressure medications. You stop the cough by changing the medication. Another example might be a cough caused by acid reflux. Controlling the reflux helps control the cough.  USE OF BRONCHODILATOR ("RESCUE") INHALERS: There is a risk from using your bronchodilator too frequently.  The risk is that over-reliance on a medication which only relaxes the muscles surrounding the  breathing tubes can reduce the effectiveness of medications prescribed to reduce swelling and congestion of the tubes themselves.  Although you feel brief relief from the bronchodilator inhaler, your asthma may actually be worsening with the tubes becoming more swollen and filled with mucus.  This can delay other crucial treatments, such as oral steroid medications. If you need to use a bronchodilator inhaler daily, several times per day, you should discuss this with your provider.  There are probably better treatments that could be used to keep your asthma under control.     HOME CARE Only take medications as instructed by your medical team. Complete the entire course of an antibiotic. Drink plenty of fluids and get plenty of rest. Avoid close contacts especially the very young and the elderly Cover your mouth if you cough or cough into your sleeve. Always remember to wash your hands A steam or ultrasonic humidifier can help congestion.   GET HELP RIGHT AWAY IF: You develop worsening fever. You become short of breath You cough up blood. Your symptoms persist after you have completed your treatment plan MAKE SURE YOU  Understand these instructions. Will watch your condition. Will get help right away if you are not doing well or get worse.    Thank you for choosing an e-visit.  Your e-visit answers were reviewed by a board certified advanced clinical practitioner to complete your personal care plan. Depending upon the condition, your plan  could have included both over the counter or prescription medications.  Please review your pharmacy choice. Make sure the pharmacy is open so you can pick up prescription now. If there is a problem, you may contact your provider through [employer] and have the prescription routed to another pharmacy.  Your safety is important to [place]. If you have drug allergies check your prescription carefully.   For the next 24 hours you can use MyChart to ask  questions about today's visit, request a non-urgent call back, or ask for a work or school excuse. You will get an email in the next two days asking about your experience. I hope that your e-visit has been valuable and will speed your recovery.   I have spent 5 minutes in review of e-visit questionnaire, review and updating patient chart, medical decision making and response to patient.   Margaretann Loveless, PA-C

## 2023-03-10 ENCOUNTER — Other Ambulatory Visit: Payer: Self-pay

## 2023-03-10 DIAGNOSIS — F902 Attention-deficit hyperactivity disorder, combined type: Secondary | ICD-10-CM | POA: Diagnosis not present

## 2023-03-20 ENCOUNTER — Other Ambulatory Visit (HOSPITAL_COMMUNITY): Payer: Self-pay

## 2023-03-24 ENCOUNTER — Other Ambulatory Visit (HOSPITAL_COMMUNITY): Payer: Self-pay

## 2023-03-29 ENCOUNTER — Other Ambulatory Visit (HOSPITAL_COMMUNITY): Payer: Self-pay

## 2023-04-03 ENCOUNTER — Other Ambulatory Visit (HOSPITAL_COMMUNITY): Payer: Self-pay

## 2023-04-03 MED ORDER — JORNAY PM 60 MG PO CP24
60.0000 mg | ORAL_CAPSULE | Freq: Every day | ORAL | 0 refills | Status: DC
  Filled 2023-04-04: qty 30, 30d supply, fill #0

## 2023-04-04 ENCOUNTER — Other Ambulatory Visit (HOSPITAL_COMMUNITY): Payer: Self-pay

## 2023-04-07 ENCOUNTER — Other Ambulatory Visit (HOSPITAL_COMMUNITY): Payer: Self-pay

## 2023-04-15 ENCOUNTER — Other Ambulatory Visit (HOSPITAL_COMMUNITY): Payer: Self-pay

## 2023-04-19 ENCOUNTER — Encounter (HOSPITAL_COMMUNITY): Payer: Self-pay

## 2023-04-21 ENCOUNTER — Other Ambulatory Visit: Payer: Self-pay

## 2023-04-23 ENCOUNTER — Other Ambulatory Visit: Payer: Self-pay

## 2023-05-05 ENCOUNTER — Encounter: Payer: Self-pay | Admitting: Physician Assistant

## 2023-05-05 NOTE — Telephone Encounter (Signed)
Ok to placed referral for patient?

## 2023-05-08 ENCOUNTER — Other Ambulatory Visit: Payer: Self-pay

## 2023-05-08 DIAGNOSIS — Z9884 Bariatric surgery status: Secondary | ICD-10-CM

## 2023-05-20 ENCOUNTER — Telehealth: Payer: PRIVATE HEALTH INSURANCE | Admitting: Physician Assistant

## 2023-05-20 DIAGNOSIS — J069 Acute upper respiratory infection, unspecified: Secondary | ICD-10-CM

## 2023-05-20 MED ORDER — BENZONATATE 100 MG PO CAPS: 100.0000 mg | ORAL_CAPSULE | Freq: Two times a day (BID) | ORAL | 0 refills | Status: DC | PRN

## 2023-05-20 NOTE — Progress Notes (Signed)
I have spent 5 minutes in review of e-visit questionnaire, review and updating patient chart, medical decision making and response to patient.   Laure Kidney, PA-C

## 2023-05-20 NOTE — Progress Notes (Signed)

## 2023-05-29 ENCOUNTER — Other Ambulatory Visit: Payer: Self-pay

## 2023-05-30 ENCOUNTER — Telehealth: Payer: PRIVATE HEALTH INSURANCE | Admitting: Family Medicine

## 2023-05-30 DIAGNOSIS — H6691 Otitis media, unspecified, right ear: Secondary | ICD-10-CM | POA: Diagnosis not present

## 2023-05-30 DIAGNOSIS — H6690 Otitis media, unspecified, unspecified ear: Secondary | ICD-10-CM

## 2023-05-30 MED ORDER — AMOXICILLIN-POT CLAVULANATE 875-125 MG PO TABS: 1.0000 | ORAL_TABLET | Freq: Two times a day (BID) | ORAL | 0 refills | Status: DC

## 2023-05-30 NOTE — Progress Notes (Signed)

## 2023-06-21 ENCOUNTER — Other Ambulatory Visit (HOSPITAL_COMMUNITY): Payer: Self-pay

## 2023-06-24 ENCOUNTER — Other Ambulatory Visit (HOSPITAL_COMMUNITY): Payer: Self-pay

## 2023-07-03 ENCOUNTER — Other Ambulatory Visit: Payer: Self-pay

## 2023-07-04 ENCOUNTER — Other Ambulatory Visit: Payer: Self-pay

## 2023-07-10 ENCOUNTER — Other Ambulatory Visit: Payer: Self-pay

## 2023-07-10 NOTE — Progress Notes (Signed)
Disenrolling - Removing patient from specialty pharmacy program. Call center staff unable to reach patient. Medication last filled 8.31.24.

## 2023-07-14 ENCOUNTER — Other Ambulatory Visit: Payer: Self-pay | Admitting: Physician Assistant

## 2023-07-24 ENCOUNTER — Other Ambulatory Visit (HOSPITAL_COMMUNITY): Payer: Self-pay

## 2023-08-21 ENCOUNTER — Ambulatory Visit: Payer: PRIVATE HEALTH INSURANCE | Attending: Family

## 2023-08-21 ENCOUNTER — Encounter (HOSPITAL_BASED_OUTPATIENT_CLINIC_OR_DEPARTMENT_OTHER): Payer: Self-pay | Admitting: Cardiovascular Disease

## 2023-08-21 DIAGNOSIS — R55 Syncope and collapse: Secondary | ICD-10-CM

## 2023-08-21 DIAGNOSIS — I10 Essential (primary) hypertension: Secondary | ICD-10-CM

## 2023-08-21 NOTE — Telephone Encounter (Signed)
Would recommend ZIO-AT (live monitor) for 2 weeks. If she has not had blood work recently and would like collected, could collect BMP, CBC, TSH.   Alver Sorrow, NP

## 2023-08-21 NOTE — Progress Notes (Unsigned)
Enrolled patient for a 14 day Zio XT Monitor to be mailed to patients home  [place] to read

## 2023-08-22 ENCOUNTER — Other Ambulatory Visit (INDEPENDENT_AMBULATORY_CARE_PROVIDER_SITE_OTHER): Payer: PRIVATE HEALTH INSURANCE

## 2023-08-22 DIAGNOSIS — R55 Syncope and collapse: Secondary | ICD-10-CM

## 2023-08-22 NOTE — Progress Notes (Unsigned)
Enrolled patient for a 14 day Zio AT monitor to be mailed to patients home  [place] to read

## 2023-08-25 DIAGNOSIS — R55 Syncope and collapse: Secondary | ICD-10-CM

## 2023-08-29 ENCOUNTER — Other Ambulatory Visit (HOSPITAL_COMMUNITY): Payer: Self-pay

## 2023-09-04 DIAGNOSIS — L219 Seborrheic dermatitis, unspecified: Secondary | ICD-10-CM | POA: Insufficient documentation

## 2023-09-20 ENCOUNTER — Telehealth: Payer: PRIVATE HEALTH INSURANCE | Admitting: Physician Assistant

## 2023-09-20 DIAGNOSIS — J029 Acute pharyngitis, unspecified: Secondary | ICD-10-CM

## 2023-09-20 NOTE — Progress Notes (Signed)

## 2023-09-29 ENCOUNTER — Ambulatory Visit (HOSPITAL_BASED_OUTPATIENT_CLINIC_OR_DEPARTMENT_OTHER): Payer: PRIVATE HEALTH INSURANCE | Admitting: Cardiovascular Disease

## 2023-09-29 ENCOUNTER — Encounter (HOSPITAL_BASED_OUTPATIENT_CLINIC_OR_DEPARTMENT_OTHER): Payer: Self-pay | Admitting: Cardiovascular Disease

## 2023-09-29 VITALS — BP 124/94 | HR 97 | Ht 67.5 in | Wt 254.4 lb

## 2023-09-29 DIAGNOSIS — I10 Essential (primary) hypertension: Secondary | ICD-10-CM

## 2023-09-29 DIAGNOSIS — R400 Somnolence: Secondary | ICD-10-CM

## 2023-09-29 DIAGNOSIS — E7800 Pure hypercholesterolemia, unspecified: Secondary | ICD-10-CM

## 2023-09-29 DIAGNOSIS — E282 Polycystic ovarian syndrome: Secondary | ICD-10-CM

## 2023-09-29 DIAGNOSIS — R55 Syncope and collapse: Secondary | ICD-10-CM | POA: Insufficient documentation

## 2023-09-29 DIAGNOSIS — R0683 Snoring: Secondary | ICD-10-CM

## 2023-09-29 MED ORDER — METOPROLOL SUCCINATE ER 25 MG PO TB24: ORAL_TABLET | ORAL | 3 refills | Status: DC

## 2023-09-29 NOTE — Patient Instructions (Signed)
 Medication Instructions:  INCREASE YOUR METOPROLOL TO 25 MG 1 AND 1/2 TABLETS   *If you need a refill on your cardiac medications before your next appointment, please call your pharmacy*  Lab Work: NONE   Testing/Procedures: Your physician has requested that you have an echocardiogram. Echocardiography is a painless test that uses sound waves to create images of your heart. It provides your doctor with information about the size and shape of your heart and how well your heart's chambers and valves are working. This procedure takes approximately one hour. There are no restrictions for this procedure. Please do NOT wear cologne, perfume, aftershave, or lotions (deodorant is allowed). Please arrive 15 minutes prior to your appointment time.  Please note: We ask at that you not bring children with you during ultrasound (echo/ vascular) testing. Due to room size and safety concerns, children are not allowed in the ultrasound rooms during exams. Our front office staff cannot provide observation of children in our lobby area while testing is being conducted. An adult accompanying a patient to their appointment will only be allowed in the ultrasound room at the discretion of the ultrasound technician under special circumstances. We apologize for any inconvenience. TO BE DONE IN 6 MONTHS ABOUT A WEEK PRIOR TO FOLLOW UP   [facility] SLEEP [facility] THE OFFICE WILL CALL YOU WITH A CODE ONCE YOUR INSURANCE HAS BEEN REVIEWED. IF YOU DO NOT HEAR FROM [place] IN 3 WEEKS CALL/MYCHART MESSAGE TO FOLLOW UP   Follow-Up: At [facility], you and your health needs are our priority.  As part of our continuing mission to provide you with exceptional heart care, we have created designated Provider Care Teams.  These Care Teams include your primary Cardiologist (physician) and Advanced Practice Providers (APPs -  Physician Assistants and Nurse Practitioners) who all work together to provide you with the care you need,  when you need it.  We recommend signing up for the patient portal called "MyChart".  Sign up information is provided on this After Visit Summary.  MyChart is used to connect with patients for Virtual Visits (Telemedicine).  Patients are able to view lab/test results, encounter notes, upcoming appointments, etc.  Non-urgent messages can be sent to your provider as well.   To learn more about what you can do with MyChart, go to [email or web address].    Your next appointment:   6 month(s)  Provider:   Chilton Si, MD    Other Instructions WatchPAT?  Is a FDA cleared portable home sleep study test that uses a watch and 3 points of contact to monitor 7 different channels, including your heart rate, oxygen saturations, body position, snoring, and chest motion.  The study is easy to use from the comfort of your own home and accurately detect sleep apnea.  Before bed, you attach the chest sensor, attached the sleep apnea bracelet to your nondominant hand, and attach the finger probe.  After the study, the raw data is downloaded from the watch and scored for apnea events.   For more information: [email or web address]  Patient Testing Instructions:  Do not put battery into the device until bedtime when you are ready to begin the test. Please call the support number if you need assistance after following the instructions below: 24 hour support line- [phone] or [facility] support at [phone] (option 2)  Download the [facility] "WatchPAT One" app through the google play store or App Store  Be sure to turn on or enable access to bluetooth in settlings on  your smartphone/ device  Make sure no other bluetooth devices are on and within the vicinity of your smartphone/ device and WatchPAT watch during testing.  Make sure to leave your smart phone/ device plugged in and charging all night.  When ready for bed:  Follow the instructions step by step in the WatchPAT One App to activate  the testing device. For additional instructions, including video instruction, visit the WatchPAT One video on Youtube. You can search for WatchPat One within Youtube (video is 4 minutes and 18 seconds) or enter: https://[email or web address]?[email or web address]_[email or web address] Please note: You will be prompted to enter a Pin to connect via bluetooth when starting the test. The PIN will be assigned to you when you receive the test.  The device is disposable, but it recommended that you retain the device until you receive a call letting you know the study has been received and the results have been interpreted.  We will let you know if the study did not transmit to [place] properly after the test is completed. You do not need to call us to confirm the receipt of the test.  Please complete the test within 48 hours of receiving PIN.   Frequently Asked Questions:  What is Watch Dennie Bible one?  A single use fully disposable home sleep apnea testing device and will not need to be returned after completion.  What are the requirements to use WatchPAT one?  The be able to have a successful watchpat one sleep study, you should have your Watch pat one device, your smart phone, watch pat one app, your PIN number and Internet access What type of phone do I need?  You should have a smart phone that uses Android 5.1 and above or any Iphone with IOS 10 and above How can I download the WatchPAT one app?  Based on your device type search for WatchPAT one app either in google play for android devices or APP store for Iphone's Where will I get my PIN for the study?  Your PIN will be provided by your physician's office. It is used for authentication and if you lose/forget your PIN, please reach out to your providers office.  I do not have Internet at home. Can I do WatchPAT one study?  WatchPAT One needs Internet connection throughout the night to be able to transmit the sleep data. You can use your home/local internet or your cellular's data package. However,  it is always recommended to use home/local Internet. It is estimated that between 20MB-30MB will be used with each study.However, the application will be looking for space in the phone to start the study.  What happens if I lose internet or bluetooth connection?  During the internet disconnection, your phone will not be able to transmit the sleep data. All the data, will be stored in your phone. As soon as the internet connection is back on, the phone will being sending the sleep data. During the bluetooth disconnection, WatchPAT one will not be able to to send the sleep data to your phone. Data will be kept in the [facility] one until two devices have bluetooth connection back on. As soon as the connection is back on, WatchPAT one will send the sleep data to the phone.  How long do I need to wear the WatchPAT one?  After you start the study, you should wear the device at least 6 hours.  How far should I keep my phone from the device?  During the night, your phone should be within  15 feet.  What happens if I leave the room for restroom or other reasons?  Leaving the room for any reason will not cause any problem. As soon as your get back to the room, both devices will reconnect and will continue to send the sleep data. Can I use my phone during the sleep study?  Yes, you can use your phone as usual during the study. But it is recommended to put your watchpat one on when you are ready to go to bed.  How will I get my study results?  A soon as you completed your study, your sleep data will be sent to the provider. They will then share the results with you when they are ready.

## 2023-09-29 NOTE — Progress Notes (Signed)
 Cardiology Office Note:  .   Date:  09/29/2023  ID:  Madison Robertson, Madison Robertson 08-29-1990, MRN [id number] PCP: Jarold Motto, PA  [facility] Providers Cardiologist:  Chilton Si, MD    History of Present Illness: .    Madison Robertson is a 33 y.o. female with hypertension, hyperlipidemia, syncope, prediabetes, asthma, anxiety, obesity status post gastric sleeve, and edema here for follow-up. She was initially seen 01/2020 for the evaluation of tachycardia and family history of CAD.  She saw [name] on 10/2019 and noted that her heart rate was sporadically over 120 at rest.  She received notifications from her Apple watch.  She note that her heart rate has been fast since her teens.  Her pediatrician noted that it was due to her ADHD medication.  Her heart rate came down the 90s when it was held.  She started back on the medication and her rate has been consistently in the 100s since then. She was minimally bothered by the symptoms. She had an echocardiogram 01/2020 that revealed LVEF 60 to 65% with normal diastolic function. She also reported lower extremity edema but there is no evidence of volume overload on her echo. She reported some bradycardia and dizziness after her COVID-19 vaccine.   She had sinus tachycardia thought to be due to her ADHD meds. She was started on metoprolol for her symptoms.  She developed a cough after starting lisinopril so was stopped and switched to losartan.  Her blood pressure was uncontrolled so losartan was switched to valsartan. She followed up with Gillian Shields, NP, and her blood pressure was better controlled but her diastolics were in the 100s. Chlorthalidone was added.   She had a gastric sleeve operation 02/2022. Valsartan was switched to irbesartan due to the size of the tablet. She has had considerable weight loss, and blood pressures were low so irbesartan was discontinued.  At her visit 06/2022 she was doing well.  Blood pressures were better  controlled and palpitations had resolved.  Metoprolol was reduced due to drowsiness.  She called the office 07/2018 for reported palpitations.   Discussed the use of AI scribe software for clinical note transcription with the patient, who gave verbal consent to proceed.  History of Present Illness   Madison Robertson presents with episodes of syncope and palpitations. She is accompanied by her mother.  In mid-January, while assisting with a procedure at work, she experienced an episode of syncope. She felt clammy and sweaty, and her coworker noted she did not look well. She then passed out and awoke on the floor. Since then, she has not experienced another syncope episode but has had recurrent episodes of feeling clammy and sensations of her heart racing, although her Apple Watch indicated a normal heart rate during these episodes.  She experiences episodes of a racing heart nearly every other day, primarily in the mornings between 9 and 11 AM. During a Holter monitor evaluation, her average heart rate was 86, with a recorded low of 59 and a high of 178. She has also experienced PVCs and PACs. She is currently taking metoprolol, and her heart rate typically ranges from 78 to 85. She has received notifications from her Apple Watch of her heart rate dropping to 46 while at rest.  Over the past two months, her blood pressure readings have shown systolic values ranging from 115 to 140 and diastolic values consistently between 90 and 100.  She reports feeling dizzy occasionally and has been experiencing significant fatigue,  making it difficult to get out of bed at times. She has reduced her physical activity since the syncope episode, having stopped walking with a coworker during lunch breaks. She attempted treadmill exercise last week, which resulted in significant fatigue for the rest of the day. During this exercise, her heart rate averaged 117 without significant spikes or drops.  She reports snoring but denies  any observed apneas or breathing difficulties during sleep. She does not feel fully rested upon waking and continues to feel tired.     ROS:  As per HPI  Studies Reviewed: Marland Kitchen   EKG Interpretation Date/Time:  Monday September 29 2023 09:21:53 EST Ventricular Rate:  89 PR Interval:  146 QRS Duration:  80 QT Interval:  342 QTC Calculation: 416 R Axis:   27  Text Interpretation: Normal sinus rhythm Possible Left atrial enlargement When compared with ECG of 15-Aug-2007 04:51, Non-specific change in ST segment in Inferior leads ST no longer elevated in Anterolateral leads Confirmed by Chilton Si ([id number]) on 09/29/2023 9:43:26 AM    EKG Interpretation Date/Time:  Monday September 29 2023 09:21:53 EST Ventricular Rate:  89 PR Interval:  146 QRS Duration:  80 QT Interval:  342 QTC Calculation: 416 R Axis:   27  Text Interpretation: Normal sinus rhythm Possible Left atrial enlargement When compared with ECG of 15-Aug-2007 04:51, Non-specific change in ST segment in Inferior leads ST no longer elevated in Anterolateral leads Confirmed by Chilton Si ([id number]) on 09/29/2023 9:43:26 AM       14 Day Zio Monitor 08/2023:    Quality: Fair.  Baseline artifact. Predominant rhythm: Sinus rhythm Average heart rate: 86 bpm Max heart rate: 178 bpm Min heart rate: 59 bpm Pauses >2.5 seconds: none   Patient reported symptoms at which time sinus tachycardia was noted.   No arrhythmias were noted  Risk Assessment/Calculations:     STOP-Bang Score:  4      Physical Exam:   VS:  BP (!) 124/94   Pulse 97   Ht 5' 7.5" (1.715 m)   Wt 254 lb 6.4 oz (115.4 kg)   SpO2 99%   BMI 39.26 kg/m  , BMI Body mass index is 39.26 kg/m. GENERAL:  Well appearing HEENT: Pupils equal round and reactive, fundi not visualized, oral mucosa unremarkable NECK:  No jugular venous distention, waveform within normal limits, carotid upstroke brisk and symmetric, no bruits, no thyromegaly LUNGS:  Clear to auscultation  bilaterally HEART:  RRR.  PMI not displaced or sustained,S1 and S2 within normal limits, no S3, no S4, no clicks, no rubs, no murmurs ABD:  Flat, positive bowel sounds normal in frequency in pitch, no bruits, no rebound, no guarding, no midline pulsatile mass, no hepatomegaly, no splenomegaly EXT:  2 plus pulses throughout, no edema, no cyanosis no clubbing SKIN:  No rashes no nodules NEURO:  Cranial nerves II through XII grossly intact, motor grossly intact throughout PSYCH:  Cognitively intact, oriented to person place and time   ASSESSMENT AND PLAN: .    # Syncope Single episode of syncope in the setting of assisting with a surgical procedure. This was a routine procedure for her.  No recurrence since. Likely vagal in nature. No associated chest pain or palpitations prior to the event. - Advised to hydrate and use compression stockings during procedures.  # Tachycardia Frequent episodes of palpitations, particularly in the morning. Holter monitor showed episodes of tachycardia up to 178 bpm. No associated chest pain. - Increase Metoprolol to 37.5mg  daily. -  Given her history of snoring and AM palpitations, we will get a home sleep study to rule out sleep apnea as a potential trigger for morning episodes.  # Hypertension Persistent elevation of diastolic blood pressure despite Metoprolol use. - Increase Metoprolol to 37.5mg  daily.  # Hair loss Patient plans to start Minoxidil for hair loss. - Approved initiation of Minoxidil 2.5mg  daily. - Monitor for signs of fluid retention and hypotension. - Plan for echocardiogram in 6 months after starting Minoxidil.  Follow-up Plan for follow-up after echocardiogram, or sooner if symptoms worsen or new symptoms develop.        Dispo: 6 months  Signed, Chilton Si, MD

## 2023-10-01 ENCOUNTER — Encounter: Payer: Self-pay | Admitting: Physician Assistant

## 2023-10-01 DIAGNOSIS — E282 Polycystic ovarian syndrome: Secondary | ICD-10-CM

## 2023-10-01 DIAGNOSIS — R5383 Other fatigue: Secondary | ICD-10-CM

## 2023-10-06 NOTE — Telephone Encounter (Signed)
 Patient was seen by Dr Duke Salvia 09/29/2023

## 2023-10-09 ENCOUNTER — Other Ambulatory Visit: Payer: Self-pay | Admitting: Physician Assistant

## 2023-10-09 ENCOUNTER — Other Ambulatory Visit (HOSPITAL_COMMUNITY): Payer: Self-pay

## 2023-10-09 ENCOUNTER — Other Ambulatory Visit: Payer: Self-pay

## 2023-10-09 MED ORDER — CITALOPRAM HYDROBROMIDE 20 MG PO TABS
20.0000 mg | ORAL_TABLET | Freq: Every day | ORAL | 1 refills | Status: DC
  Filled 2023-10-09: qty 90, 90d supply, fill #0
  Filled 2024-01-24: qty 90, 90d supply, fill #1

## 2023-10-22 ENCOUNTER — Encounter (HOSPITAL_COMMUNITY): Payer: Self-pay | Admitting: *Deleted

## 2023-10-27 ENCOUNTER — Telehealth: Payer: Self-pay

## 2023-10-27 NOTE — Telephone Encounter (Signed)
**Note De-Identified Madison Robertson Obfuscation** Ordering provider: Dr Duke Salvia Associated diagnoses: Snoring-R06.83 and Somnolence-R40.0  WatchPAT PA obtained on 10/27/2023 by Madison Robertson, Madison Formosa, LPN. Authorization: I called [employer] and was advsied by [facility] that a PA is not required for CPT Code: 29528 ([facility]HST). Reference #: [id number]  Patient notified of PIN ([id number]) on 10/27/2023 Madison Robertson Notification Method: phone.  Phone note routed to covering staff for follow-up.

## 2023-11-02 ENCOUNTER — Encounter (INDEPENDENT_AMBULATORY_CARE_PROVIDER_SITE_OTHER): Payer: PRIVATE HEALTH INSURANCE | Admitting: Cardiology

## 2023-11-02 DIAGNOSIS — G4733 Obstructive sleep apnea (adult) (pediatric): Secondary | ICD-10-CM | POA: Diagnosis not present

## 2023-11-03 ENCOUNTER — Encounter: Payer: Self-pay | Admitting: Physician Assistant

## 2023-11-03 ENCOUNTER — Ambulatory Visit (INDEPENDENT_AMBULATORY_CARE_PROVIDER_SITE_OTHER): Payer: PRIVATE HEALTH INSURANCE | Admitting: Physician Assistant

## 2023-11-03 ENCOUNTER — Ambulatory Visit: Payer: PRIVATE HEALTH INSURANCE | Attending: Cardiovascular Disease

## 2023-11-03 VITALS — BP 150/100 | HR 91 | Temp 98.3°F | Ht 67.5 in | Wt 251.4 lb

## 2023-11-03 DIAGNOSIS — Z9884 Bariatric surgery status: Secondary | ICD-10-CM | POA: Diagnosis not present

## 2023-11-03 DIAGNOSIS — Z0000 Encounter for general adult medical examination without abnormal findings: Secondary | ICD-10-CM | POA: Diagnosis not present

## 2023-11-03 DIAGNOSIS — Z1159 Encounter for screening for other viral diseases: Secondary | ICD-10-CM

## 2023-11-03 DIAGNOSIS — E282 Polycystic ovarian syndrome: Secondary | ICD-10-CM

## 2023-11-03 DIAGNOSIS — E669 Obesity, unspecified: Secondary | ICD-10-CM

## 2023-11-03 DIAGNOSIS — I10 Essential (primary) hypertension: Secondary | ICD-10-CM

## 2023-11-03 DIAGNOSIS — R0683 Snoring: Secondary | ICD-10-CM

## 2023-11-03 DIAGNOSIS — Z114 Encounter for screening for human immunodeficiency virus [HIV]: Secondary | ICD-10-CM

## 2023-11-03 DIAGNOSIS — R400 Somnolence: Secondary | ICD-10-CM

## 2023-11-03 DIAGNOSIS — G4733 Obstructive sleep apnea (adult) (pediatric): Secondary | ICD-10-CM

## 2023-11-03 LAB — LIPID PANEL
Cholesterol: 207 mg/dL — ABNORMAL HIGH (ref 0–200)
HDL: 59.9 mg/dL (ref >39.00)
LDL Cholesterol: 121 mg/dL — ABNORMAL HIGH (ref 0–99)
NonHDL: 147.02
Total CHOL/HDL Ratio: 3
Triglycerides: 132 mg/dL (ref 0.0–149.0)
VLDL: 26.4 mg/dL (ref 0.0–40.0)

## 2023-11-03 LAB — IBC + FERRITIN
Ferritin: 40.6 ng/mL (ref 10.0–291.0)
Iron: 56 ug/dL (ref 42–145)
Saturation Ratios: 13.7 % — ABNORMAL LOW (ref 20.0–50.0)
TIBC: 407.4 ug/dL (ref 250.0–450.0)
Transferrin: 291 mg/dL (ref 212.0–360.0)

## 2023-11-03 LAB — CBC WITH DIFFERENTIAL/PLATELET
Basophils Absolute: 0 10*3/uL (ref 0.0–0.1)
Basophils Relative: 0.3 % (ref 0.0–3.0)
Eosinophils Absolute: 0.1 10*3/uL (ref 0.0–0.7)
Eosinophils Relative: 0.6 % (ref 0.0–5.0)
HCT: 38.8 % (ref 36.0–46.0)
Hemoglobin: 12.8 g/dL (ref 12.0–15.0)
Lymphocytes Relative: 25.1 % (ref 12.0–46.0)
Lymphs Abs: 2.7 10*3/uL (ref 0.7–4.0)
MCHC: 33 g/dL (ref 30.0–36.0)
MCV: 87.2 fl (ref 78.0–100.0)
Monocytes Absolute: 0.6 10*3/uL (ref 0.1–1.0)
Monocytes Relative: 5.2 % (ref 3.0–12.0)
Neutro Abs: 7.5 10*3/uL (ref 1.4–7.7)
Neutrophils Relative %: 68.8 % (ref 43.0–77.0)
Platelets: 214 10*3/uL (ref 150.0–400.0)
RBC: 4.45 Mil/uL (ref 3.87–5.11)
RDW: 14.1 % (ref 11.5–15.5)
WBC: 10.9 10*3/uL — ABNORMAL HIGH (ref 4.0–10.5)

## 2023-11-03 LAB — COMPREHENSIVE METABOLIC PANEL WITH GFR
ALT: 5 U/L (ref 0–35)
AST: 10 U/L (ref 0–37)
Albumin: 4.1 g/dL (ref 3.5–5.2)
Alkaline Phosphatase: 71 U/L (ref 39–117)
BUN: 8 mg/dL (ref 6–23)
CO2: 28 meq/L (ref 19–32)
Calcium: 8.9 mg/dL (ref 8.4–10.5)
Chloride: 104 meq/L (ref 96–112)
Creatinine, Ser: 0.79 mg/dL (ref 0.40–1.20)
GFR: 97.28 mL/min (ref >60.00)
Glucose, Bld: 74 mg/dL (ref 70–99)
Potassium: 3.4 meq/L — ABNORMAL LOW (ref 3.5–5.1)
Sodium: 139 meq/L (ref 135–145)
Total Bilirubin: 0.3 mg/dL (ref 0.2–1.2)
Total Protein: 7.6 g/dL (ref 6.0–8.3)

## 2023-11-03 LAB — VITAMIN D 25 HYDROXY (VIT D DEFICIENCY, FRACTURES): VITD: 21.84 ng/mL — ABNORMAL LOW (ref 30.00–100.00)

## 2023-11-03 LAB — VITAMIN B12: Vitamin B-12: 328 pg/mL (ref 211–911)

## 2023-11-03 MED ORDER — NORETHINDRONE 0.35 MG PO TABS: 1.0000 | ORAL_TABLET | Freq: Every day | ORAL | 3 refills | Status: AC

## 2023-11-03 NOTE — Procedures (Signed)
    SLEEP STUDY REPORT Patient Information Study Date: 11/02/2023 Patient Name: Madison Robertson Patient ID: [id number] Birth Date: 21-Aug-1990 Age: 33 Gender: Female BMI: 39.8 (W=253 lb, H=5' 7'')  Referring Physician: Chilton Si, MD  TEST DESCRIPTION: Home sleep apnea testing was completed using the WatchPat, a Type 1 device, utilizing  peripheral arterial tonometry (PAT), chest movement, actigraphy, pulse oximetry, pulse rate, body position and snore.  AHI was calculated with apnea and hypopnea using valid sleep time as the denominator. RDI includes apneas,  hypopneas, and RERAs. The data acquired and the scoring of sleep and all associated events were performed in  accordance with the recommended standards and specifications as outlined in the AASM Manual for the Scoring of  Sleep and Associated Events 2.2.0 (2015).  FINDINGS:  1. Moderate Obstructive Sleep Apnea with AHI 16.9/hr.   2. No Central Sleep Apnea with pAHIc 0.1/hr.  3. Oxygen desaturations as low as 84%.  4. Moderate snoring was present. O2 sats were < 88% for 0.1 min.  5. Total sleep time was 8 hrs and 30 min.  6. 27.9% of total sleep time was spent in REM sleep.   7. sleep onset latency at 16 min  8. REM sleep onset latency at 37 min.   9. Total awakenings were 5.  10. Arrhythmia detection: None  DIAGNOSIS:  Moderate Obstructive Sleep Apnea (G47.33)  RECOMMENDATIONS: 1. Clinical correlation of these findings is necessary. The decision to treat obstructive sleep apnea (OSA) is usually  based on the presence of apnea symptoms or the presence of associated medical conditions such as Hypertension,  Congestive Heart Failure, Atrial Fibrillation or Obesity. The most common symptoms of OSA are snoring, gasping for  breath while sleeping, daytime sleepiness and fatigue.   2. Initiating apnea therapy is recommended given the presence of symptoms and/or associated conditions.  Recommend proceeding with one of the  following:   a. Auto-CPAP therapy with a pressure range of 5-20cm H2O.   b. An oral appliance (OA) that can be obtained from certain dentists with expertise in sleep medicine. These are  primarily of use in non-obese patients with mild and moderate disease.   c. An ENT consultation which may be useful to look for specific causes of obstruction and possible treatment  options.   d. If patient is intolerant to PAP therapy, consider referral to ENT for evaluation for hypoglossal nerve stimulator.   3. Close follow-up is necessary to ensure success with CPAP or oral appliance therapy for maximum benefit .  4. A follow-up oximetry study on CPAP is recommended to assess the adequacy of therapy and determine the need  for supplemental oxygen or the potential need for Bi-level therapy. An arterial blood gas to determine the adequacy of  baseline ventilation and oxygenation should also be considered.  5. Healthy sleep recommendations include: adequate nightly sleep (normal 7-9 hrs/night), avoidance of caffeine after  noon and alcohol near bedtime, and maintaining a sleep environment that is cool, dark and quiet.  6. Weight loss for overweight patients is recommended. Even modest amounts of weight loss can significantly  improve the severity of sleep apnea.  7. Snoring recommendations include: weight loss where appropriate, side sleeping, and avoidance of alcohol before  bed.  8. Operation of motor vehicle should not be performed when sleepy.  Signature: Armanda Magic, MD; [facility]; Diplomat, American Board of Sleep  Medicine Electronically Signed: 11/03/2023 8:38:57 AM

## 2023-11-03 NOTE — Progress Notes (Addendum)
 Subjective:    Madison Robertson is a 33 y.o. female and is here for a comprehensive physical exam.  HPI  Health Maintenance Due  Topic Date Due   HIV Screening  Never done   Hepatitis C Screening  Never done    Acute Concerns: Moderate OSA She reports having a second sleep study done last night with results consistent with moderate sleep apnea. Her first sleep study revealed mild sleep apnea.  Originally had a sleep study done due to syncopal episode with fatigue and  snoring.  She is shocked to learn of these results given she has lost weight since her first sleep study.   Chronic Issues: Hypertension Pt is on Metoprolol succinate 37.5 mg once daily.  Good compliance and tolerance with no adverse side effects.  Has been monitoring her blood pressures at home; reports readings averaging 115/70s Her last BP reading was 150/95 and heart rate in the 70s.  She is established with Dr. Chilton Si of cardiology.  Pt scheduled to have an ECHO later this year.   Depression // Anxiety  Pt is on Celexa 20 mg once daily.  Good compliance and tolerance with no adverse side effects. .  Moods are stable currently.  No acute concerns today.  Denies any SI/HI.   Weight management Pt reports a hx of gastric sleeve on 03/19/22.  She has been taking her bariatric supplements and iron supplements.  Has been working on [employer] and regular exercise.   Birth control / OCPs Pt has been having regular menses.  Taking iron supplements.  Has temporal pain with migraines, stating she also feels it "behind the eye".  Also endorses photosensitivity.  Denies any nausea.   Health Maintenance: Immunizations -- utd Colonoscopy -- N/a Mammogram -- N/a PAP --  UpToDate per patient -- requesting records Bone Density -- N/a Diet -- Overall healthy and balanced diet. Drinks at least 64 ounces of water per day.  Exercise -- Exercising 3 times per week at the gym or walking on treadmill at  home.   Sleep habits -- No concerns.  Mood -- Stable.   UTD with dentist? - Yes.  UTD with eye doctor? - Yes.   Weight history: Wt Readings from Last 10 Encounters:  11/03/23 251 lb 6.1 oz (114 kg)  09/29/23 254 lb 6.4 oz (115.4 kg)  10/01/22 252 lb (114.3 kg)  08/14/22 251 lb 9.6 oz (114.1 kg)  07/18/22 254 lb 9.6 oz (115.5 kg)  05/14/22 263 lb 3.2 oz (119.4 kg)  04/03/22 278 lb (126.1 kg)  03/19/22 294 lb 6.4 oz (133.5 kg)  03/18/22 294 lb 6.4 oz (133.5 kg)  03/08/22 295 lb (133.8 kg)   Body mass index is 38.79 kg/m. Patient's last menstrual period was 10/14/2023 (exact date).  Alcohol use:  reports current alcohol use.  Tobacco use:  Tobacco Use: Low Risk  (11/03/2023)   Patient History    Smoking Tobacco Use: Never    Smokeless Tobacco Use: Never    Passive Exposure: Not on file   Eligible for lung cancer screening? no     11/03/2023    2:18 PM  Depression screen PHQ 2/9  Decreased Interest 2  Down, Depressed, Hopeless 2  PHQ - 2 Score 4  Altered sleeping 1  Tired, decreased energy 2  Change in appetite 0  Feeling bad or failure about yourself  0  Trouble concentrating 1  Moving slowly or fidgety/restless 0  Suicidal thoughts 0  PHQ-9 Score  8  Difficult doing work/chores Somewhat difficult     Other providers/specialists: Patient Care Team: Jarold Motto, [place] as PCP - General (Physician Assistant) Chilton Si, MD as PCP - Cardiology (Cardiology) Myrtie Neither Andreas Blower, MD as Consulting Physician (Gastroenterology)    PMHx, SurgHx, SocialHx, Medications, and Allergies were reviewed in the Visit Navigator and updated as appropriate.   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   Depression    Edema, lower extremity    Essential hypertension 06/29/2018    Lisinopril-hydrochlorothiazide 20-25 mg   GERD (gastroesophageal reflux disease)    Heart murmur    Hyperhidrosis 05/29/2021   Hyperlipidemia    Has never been on medication for this   Hypertension    Lisinopril-HCTZ   IBS (irritable bowel syndrome)    Intertrigo 05/29/2021   Intrinsic atopic dermatitis 05/28/2021   Lactose intolerance    Migraines    well-controlled when blood pressure is controlled   Morbid obesity (HCC) 02/07/2020   Palpitations 02/07/2020   PCOS (polycystic ovarian syndrome)    10 years; was on metformin for 2 months but didn't like the way it made her feel; has never been on spironolactone   Pre-diabetes    Hemoglobin A1c 6.0 in September 2019   Pure hypercholesterolemia 02/07/2020     Past Surgical History:  Procedure Laterality Date   COLONOSCOPY  2022   ESOPHAGOGASTRODUODENOSCOPY ENDOSCOPY     LAPAROSCOPIC GASTRIC SLEEVE RESECTION  02/26/2022   UPPER GI ENDOSCOPY N/A 03/19/2022   Procedure: UPPER GI ENDOSCOPY;  Surgeon: Rodman Pickle, MD;  Location: [facility];  Service: General;  Laterality: N/A;   WISDOM TOOTH EXTRACTION     Feb 2017     Family History  Problem Relation Age of Onset   Diabetes Mother    Hypertension Mother    Colon polyps Mother    High Cholesterol Mother    Depression Mother    Anxiety disorder Mother    Obesity Mother    Heart failure Mother    Asthma Mother    COPD Mother    Other Mother        Karlene Lineman -- s/p flu shot   Diabetes Father    Hypertension Father    Congestive Heart Failure Father    Heart disease Father    Atrial fibrillation Father    Heart attack Father    Alcohol abuse Father    COPD Father    Stroke Father    Esophageal cancer Maternal Grandmother    Hypertension Maternal Grandmother    Colon cancer Maternal Grandfather    Hypertension Maternal Grandfather    Hypertension Paternal Grandmother    Prostate cancer Paternal Grandfather    Hypertension Paternal Grandfather     Esophageal cancer Maternal Uncle    Breast cancer Other        Paternal great aunt,distal cousin,    Ovarian cancer Other        Paternal great grandmother   Uterine cancer Other    Pancreatic cancer Neg Hx    Rectal cancer Neg Hx    Stomach cancer Neg Hx     Social History   Tobacco Use   Smoking status: Never   Smokeless tobacco: Never  Vaping Use   Vaping status:  Never Used  Substance Use Topics   Alcohol use: Yes    Comment: Socially   Drug use: No    Review of Systems:   Review of Systems  Constitutional:  Negative for chills, fever, malaise/fatigue and weight loss.  HENT:  Negative for hearing loss, sinus pain and sore throat.   Respiratory:  Negative for cough and hemoptysis.   Cardiovascular:  Negative for chest pain, palpitations, leg swelling and PND.  Gastrointestinal:  Negative for abdominal pain, constipation, diarrhea, heartburn, nausea and vomiting.  Genitourinary:  Negative for dysuria, frequency and urgency.  Musculoskeletal:  Negative for back pain, myalgias and neck pain.  Skin:  Negative for itching and rash.  Neurological:  Negative for dizziness, tingling, seizures and headaches.  Endo/Heme/Allergies:  Negative for polydipsia.  Psychiatric/Behavioral:  Negative for depression. The patient is not nervous/anxious.      Objective:   BP (!) 150/100 (BP Location: Left Arm, Patient Position: Sitting, Cuff Size: Large)   Pulse 91   Temp 98.3 F (36.8 C) (Temporal)   Ht 5' 7.5" (1.715 m)   Wt 251 lb 6.1 oz (114 kg)   LMP 10/14/2023 (Exact Date)   SpO2 99%   BMI 38.79 kg/m  Body mass index is 38.79 kg/m.   General Appearance:    Alert, cooperative, no distress, appears stated age  Head:    Normocephalic, without obvious abnormality, atraumatic  Eyes:    PERRL, conjunctiva/corneas clear, EOM's intact, fundi    benign, both eyes  Ears:    Normal TM's and external ear canals, both ears  Nose:   Nares normal, septum midline, mucosa normal, no  drainage    or sinus tenderness  Throat:   Lips, mucosa, and tongue normal; teeth and gums normal  Neck:   Supple, symmetrical, trachea midline, no adenopathy;    thyroid:  no enlargement/tenderness/nodules; no carotid   bruit or JVD  Back:     Symmetric, no curvature, ROM normal, no CVA tenderness  Lungs:     Clear to auscultation bilaterally, respirations unlabored  Chest Wall:    No tenderness or deformity   Heart:    Regular rate and rhythm, S1 and S2 normal, no murmur, rub or gallop  Breast Exam:    Deferred  Abdomen:     Soft, non-tender, bowel sounds active all four quadrants,    no masses, no organomegaly  Genitalia:    Deferred  Extremities:   Extremities normal, atraumatic, no cyanosis or edema  Pulses:   2+ and symmetric all extremities  Skin:   Skin color, texture, turgor normal, no rashes or lesions  Lymph nodes:   Cervical, supraclavicular, and axillary nodes normal  Neurologic:   CNII-XII intact, normal strength, sensation and reflexes    throughout    Assessment/Plan:   Routine physical examination Today patient counseled on age appropriate routine health concerns for screening and prevention, each reviewed and up to date or declined. Immunizations reviewed and up to date or declined. Labs ordered and reviewed. Risk factors for depression reviewed and negative. Hearing function and visual acuity are intact. ADLs screened and addressed as needed. Functional ability and level of safety reviewed and appropriate. Education, counseling and referrals performed based on assessed risks today. Patient provided with a copy of personalized plan for preventive services.  PCOS (polycystic ovarian syndrome) Overall well controlled per patient  I did recommend that we change oral contraceptive pill(s) to progesterone only pill to further help with hypertension -- she is agreeable If  she has issues with breakthrough bleeding, encouraged consistent intake and if persists >3 months, will  defer to gynecology for additional management of symptom(s)   S/P laparoscopic sleeve gastrectomy Compliant with bariatric vitamins, fluid and protein goals, as well as excellent exercise Continue ongoing efforts at weight loss  Encounter for screening for other viral diseases Update hepatitis C   Screening for HIV (human immunodeficiency virus) Update HIV   OSA (obstructive sleep apnea) Recent increase from Mild to Moderate despite weight loss; defer to specialists to manage -- she is considering oral appliance  Obesity, unspecified class, unspecified obesity type, unspecified whether serious comorbidity present Continue efforts at healthy lifestyle  Essential hypertension Above goal today No evidence of end-organ damage on my exam Recommend patient monitor home blood pressure at least a few times weekly Continue Metoprolol succinate 37.5 mg daily Address obstructive sleep apnea - she plans to get oral device I did recommend that we change oral contraceptive pill(s) to progesterone only pill to further help with hypertension -- she is agreeable If home monitoring shows consistent elevation, or any symptom(s) develop, recommend reach out to [place] for further advice on next steps    I, Isabelle Course, acting as a Neurosurgeon for Jarold Motto, [place]., have documented all relevant documentation on the behalf of Jarold Motto, [place], as directed by  Jarold Motto, PA while in the presence of Jarold Motto, [place].  I, Isabelle Course, have reviewed all documentation for this visit. The documentation on 11/03/23 for the exam, diagnosis, procedures, and orders are all accurate and complete.  Jarold Motto, PA-C  [facility]

## 2023-11-04 ENCOUNTER — Telehealth: Payer: Self-pay

## 2023-11-04 ENCOUNTER — Encounter: Payer: Self-pay | Admitting: Physician Assistant

## 2023-11-04 ENCOUNTER — Other Ambulatory Visit: Payer: Self-pay | Admitting: Physician Assistant

## 2023-11-04 DIAGNOSIS — R400 Somnolence: Secondary | ICD-10-CM

## 2023-11-04 DIAGNOSIS — I10 Essential (primary) hypertension: Secondary | ICD-10-CM

## 2023-11-04 DIAGNOSIS — G4733 Obstructive sleep apnea (adult) (pediatric): Secondary | ICD-10-CM

## 2023-11-04 DIAGNOSIS — R0683 Snoring: Secondary | ICD-10-CM

## 2023-11-04 DIAGNOSIS — D72829 Elevated white blood cell count, unspecified: Secondary | ICD-10-CM

## 2023-11-04 LAB — HIV ANTIBODY (ROUTINE TESTING W REFLEX): HIV 1&2 Ab, 4th Generation: NONREACTIVE

## 2023-11-04 LAB — HEPATITIS C ANTIBODY: Hepatitis C Ab: NONREACTIVE

## 2023-11-04 MED ORDER — VITAMIN D (ERGOCALCIFEROL) 1.25 MG (50000 UNIT) PO CAPS: 50000.0000 [IU] | ORAL_CAPSULE | ORAL | 0 refills | Status: DC

## 2023-11-04 NOTE — Telephone Encounter (Signed)
Left Vm with callback number for patient to receive sleep study results and recommendations.

## 2023-11-04 NOTE — Telephone Encounter (Signed)
-----   Message from Armanda Magic sent at 11/03/2023  8:40 AM EDT ----- Please let patient know that they have sleep apnea and recommend treating with CPAP.  Please order an auto CPAP from 4-15cm H2O with heated humidity and mask of choice.  Order overnight pulse ox on CPAP.  Followup with me in 6 weeks.

## 2023-11-06 NOTE — Addendum Note (Signed)
 Addended by: Reesa Chew on: 11/06/2023 04:40 PM   Modules accepted: Orders

## 2023-11-06 NOTE — Telephone Encounter (Signed)
 Return Call: The patient has been notified of the result and verbalized understanding.  All questions (if any) were answered. Latrelle Dodrill, CMA 11/06/2023 4:35 PM    Patient states she would like a Rx for the oral appliance.

## 2023-11-06 NOTE — Telephone Encounter (Signed)
Called results lmtcb. 

## 2023-11-11 NOTE — Telephone Encounter (Signed)
 Patient says she has had weight loss surgery and she still wants to try the oral appliance.

## 2023-12-03 ENCOUNTER — Encounter (HOSPITAL_BASED_OUTPATIENT_CLINIC_OR_DEPARTMENT_OTHER): Payer: Self-pay

## 2023-12-30 ENCOUNTER — Ambulatory Visit (HOSPITAL_COMMUNITY)
Admission: RE | Admit: 2023-12-30 | Discharge: 2023-12-30 | Disposition: A | Discharge status: Home / Self Care | Payer: PRIVATE HEALTH INSURANCE | Source: Ambulatory Visit | Attending: Cardiovascular Disease | Admitting: Cardiovascular Disease

## 2023-12-30 ENCOUNTER — Ambulatory Visit: Payer: Self-pay | Admitting: Cardiovascular Disease

## 2023-12-30 DIAGNOSIS — E7800 Pure hypercholesterolemia, unspecified: Secondary | ICD-10-CM | POA: Diagnosis not present

## 2023-12-30 DIAGNOSIS — I10 Essential (primary) hypertension: Secondary | ICD-10-CM

## 2023-12-30 LAB — ECHOCARDIOGRAM COMPLETE
Area-P 1/2: 4.06 cm2
S' Lateral: 2.9 cm

## 2024-01-13 ENCOUNTER — Encounter (HOSPITAL_BASED_OUTPATIENT_CLINIC_OR_DEPARTMENT_OTHER): Payer: Self-pay | Admitting: Cardiovascular Disease

## 2024-01-13 DIAGNOSIS — I10 Essential (primary) hypertension: Secondary | ICD-10-CM

## 2024-01-14 MED ORDER — METOPROLOL SUCCINATE ER 25 MG PO TB24: ORAL_TABLET | ORAL | 3 refills | Status: AC

## 2024-01-24 ENCOUNTER — Other Ambulatory Visit (HOSPITAL_COMMUNITY): Payer: Self-pay

## 2024-04-16 ENCOUNTER — Encounter: Payer: Self-pay | Admitting: Physician Assistant

## 2024-04-16 ENCOUNTER — Ambulatory Visit: Payer: PRIVATE HEALTH INSURANCE | Admitting: Physician Assistant

## 2024-04-16 VITALS — BP 130/90 | HR 79 | Temp 98.2°F | Ht 67.5 in | Wt 240.0 lb

## 2024-04-16 DIAGNOSIS — G4733 Obstructive sleep apnea (adult) (pediatric): Secondary | ICD-10-CM | POA: Diagnosis not present

## 2024-04-16 DIAGNOSIS — I10 Essential (primary) hypertension: Secondary | ICD-10-CM | POA: Diagnosis not present

## 2024-04-16 DIAGNOSIS — R55 Syncope and collapse: Secondary | ICD-10-CM | POA: Diagnosis not present

## 2024-04-16 DIAGNOSIS — R000 Tachycardia, unspecified: Secondary | ICD-10-CM | POA: Diagnosis not present

## 2024-04-16 NOTE — Patient Instructions (Addendum)
 [facility] - [facility] 4.2(21)  Neurologist [address]  [phone]

## 2024-04-16 NOTE — Progress Notes (Signed)
 Madison Robertson is a 33 y.o. female here for a follow up of a pre-existing problem.  History of Present Illness:   Chief Complaint  Patient presents with   Referral    Pt would like to discuss referral for Neurology.   Discussed the use of AI scribe software for clinical note transcription with the patient, who gave verbal consent to proceed.  History of Present Illness   Madison Robertson is a 33 year old female who presents with recurrent syncopal episodes.  She experienced a syncopal episode at work on Monday around 11 AM, losing consciousness after exiting a patient's room. Upon regaining awareness, she was on the floor, feeling sweaty, especially on the back of her neck, without preceding dizziness or fatigue. She had eaten and was hydrated prior to the episode. A similar episode occurred a couple of weeks prior, involving severe abdominal pain at 2 AM, a rush of blood draining inside of her body, ringing in her ears, disorientation, and affected vision, though she did not lose consciousness.  Her past cardiac workup includes an echocardiogram and a heart monitor, which showed tachycardia during alerts. She was on 25 mg of metoprolol , increased to 37.5 mg due to tachycardia, resulting in a heart rate decrease from the 100s to the 60s-70s. She has undergone weight loss surgery and has been on both Wegovy and Zepbound for weight management, with rapid weight loss over the summer leading to a medication change due to nausea. Blood work in April showed slightly low potassium and vitamin D  levels.  She has no headaches, dizziness, or new symptoms. There is a family history of postural hypotension and a father who passed away from a heart attack in 2021. She experiences occasional lightheadedness when standing quickly and dizziness when turning her head to one side. A sleep study showed mild sleep apnea in 2023 with approx 5 AHI and a follow up sleep study showed moderate sleep apnea with 16 AHI in  2025, however she was not prescribed a CPAP due to recent weight loss. She reports snoring without observed apneas.       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   Depression    Edema, lower extremity    Essential hypertension 06/29/2018   Lisinopril -hydrochlorothiazide  20-25 mg   GERD (gastroesophageal reflux disease)    Heart murmur    Hyperhidrosis 05/29/2021   Hyperlipidemia    Has never been on medication for this   Hypertension    Lisinopril -HCTZ   IBS (irritable bowel syndrome)    Intertrigo 05/29/2021   Intrinsic atopic dermatitis 05/28/2021   Lactose intolerance    Migraines    well-controlled when blood pressure is controlled   Morbid obesity (HCC) 02/07/2020   Palpitations 02/07/2020   PCOS (polycystic ovarian syndrome)    10 years; was on metformin  for 2 months but didn't like the way it made her feel; has never been on spironolactone   Pre-diabetes    Hemoglobin A1c 6.0 in September 2019   Pure hypercholesterolemia 02/07/2020     Social History   Tobacco Use   Smoking status: Never   Smokeless tobacco: Never  Vaping Use   Vaping status: Never Used  Substance Use Topics   Alcohol use: Yes  Comment: Socially   Drug use: No    Past Surgical History:  Procedure Laterality Date   COLONOSCOPY  2022   ESOPHAGOGASTRODUODENOSCOPY ENDOSCOPY     LAPAROSCOPIC GASTRIC SLEEVE RESECTION  02/26/2022   UPPER GI ENDOSCOPY N/A 03/19/2022   Procedure: UPPER GI ENDOSCOPY;  Surgeon: Stevie Herlene Righter, MD;  Location: [facility];  Service: General;  Laterality: N/A;   WISDOM TOOTH EXTRACTION     Feb 2017    Family History  Problem Relation Age of Onset   Diabetes Mother    Hypertension Mother    Colon polyps Mother    High Cholesterol Mother    Depression Mother    Anxiety  disorder Mother    Obesity Mother    Heart failure Mother    Asthma Mother    COPD Mother    Other Mother        Sheliah -- s/p flu shot   Diabetes Father    Hypertension Father    Congestive Heart Failure Father    Heart disease Father    Atrial fibrillation Father    Heart attack Father    Alcohol abuse Father    COPD Father    Stroke Father    Esophageal cancer Maternal Grandmother    Hypertension Maternal Grandmother    Colon cancer Maternal Grandfather    Hypertension Maternal Grandfather    Hypertension Paternal Grandmother    Prostate cancer Paternal Grandfather    Hypertension Paternal Grandfather    Esophageal cancer Maternal Uncle    Breast cancer Other        Paternal great aunt,distal cousin,    Ovarian cancer Other        Paternal great grandmother   Uterine cancer Other    Pancreatic cancer Neg Hx    Rectal cancer Neg Hx    Stomach cancer Neg Hx     Allergies  Allergen Reactions   Elderberry     Throat swelling   Sambucus Nigra Itching and Other (See Comments)    Throat swelling   Prednisone  Other (See Comments)   Amlodipine  Other (See Comments)    edema   Hydrocodone Nausea And Vomiting   Lisinopril  Cough    Current Medications:   Current Outpatient Medications:    citalopram  (CELEXA ) 20 MG tablet, Take 1 tablet (20 mg total) by mouth daily., Disp: 90 tablet, Rfl: 1   clobetasol  ointment (TEMOVATE ) 0.05 %, Apply to the affected area of scalp 4-5 times weekly. (Patient taking differently: 1 Application daily as needed (eczema).), Disp: 60 g, Rfl: 3   Dupilumab  (DUPIXENT ) 300 MG/2ML SOPN, Inject 300 mg under the skin every 14 (fourteen) days., Disp: 4 mL, Rfl: 7   Fluocinolone Acetonide 0.01 % OIL, as needed., Disp: , Rfl:    JORNAY PM  80 MG CP24, Take 1 capsule by mouth at bedtime., Disp: , Rfl:    metoprolol  succinate (TOPROL -XL) 25 MG 24 hr tablet, TAKE 1 AND 1/2 TABLETS DAILY, Disp: 135 tablet, Rfl: 3   minoxidil (LONITEN) 2.5 MG tablet,  Take 2.5 mg by mouth daily., Disp: , Rfl:    Multiple Vitamin (MULTI-VITAMIN) tablet, Take 1 tablet by mouth daily., Disp: , Rfl:    norethindrone  (ORTHO MICRONOR ) 0.35 MG tablet, Take 1 tablet (0.35 mg total) by mouth daily., Disp: 84 tablet, Rfl: 3   ondansetron  (ZOFRAN -ODT) 8 MG disintegrating tablet, Take 8 mg by mouth every 8 (eight) hours as needed., Disp: , Rfl:    tacrolimus  (PROTOPIC ) 0.1 % ointment, Apply  once daily to the affected areas. (Patient taking differently: Apply 1 Application topically daily as needed (eczema).), Disp: 60 g, Rfl: 3   ZEPBOUND 5 MG/0.5ML Pen, Inject 5 mg into the skin once a week., Disp: , Rfl:   Current Facility-Administered Medications:    0.9 %  sodium chloride  infusion, 500 mL, Intravenous, Continuous, Danis, Victory CROME III, MD   Review of Systems:   Negative unless otherwise specified per HPI.  Vitals:   Vitals:   04/16/24 1359 04/16/24 1436  BP: (!) 138/100 (!) 130/90  Pulse: 79   Temp: 98.2 F (36.8 C)   TempSrc: Temporal   SpO2: 99%   Weight: 240 lb (108.9 kg)   Height: 5' 7.5 (1.715 m)      Body mass index is 37.03 kg/m.  Physical Exam:   Physical Exam Vitals and nursing note reviewed.  Constitutional:      General: She is not in acute distress.    Appearance: She is well-developed. She is not ill-appearing or toxic-appearing.  Cardiovascular:     Rate and Rhythm: Normal rate and regular rhythm.     Pulses: Normal pulses.     Heart sounds: Normal heart sounds, S1 normal and S2 normal.  Pulmonary:     Effort: Pulmonary effort is normal.     Breath sounds: Normal breath sounds.  Skin:    General: Skin is warm and dry.  Neurological:     General: No focal deficit present.     Mental Status: She is alert.     GCS: GCS eye subscore is 4. GCS verbal subscore is 5. GCS motor subscore is 6.     Cranial Nerves: Cranial nerves 2-12 are intact.     Sensory: Sensation is intact.     Motor: Motor function is intact.     Coordination:  Coordination is intact.     Gait: Gait is intact.  Psychiatric:        Speech: Speech normal.        Behavior: Behavior normal. Behavior is cooperative.     Assessment and Plan:   Assessment and Plan    Syncope Recurrent syncope without preceding symptoms. Previous cardiac workup negative for arrhythmias or structural issues. Differential includes POTS due to dizziness with positional changes and family history of postural hypotension. - [facility] at [facility] for further evaluation. - Consider brain MRI/MRA if neurology appointment is delayed or new symptoms arise. - Encourage increased hydration. - Advise wearing compression stockings.  Tachycardia Tachycardia improved with metoprolol , heart rate now in the 60s-70s. Previous episodes in the 100s. Metoprolol  dose increased to 37.5 mg earlier this year. - Continue current metoprolol  regimen.  Diastolic hypertension Diastolic pressures consistently in the 90s. Mild to moderate obstructive sleep apnea could contribute to hypertension. No CPAP due to weight loss and mild apnea diagnosis. - Refer to [facility] on [facility] for further evaluation of sleep apnea. - Advise monitoring blood pressure regularly. - Discussed potential need for CPAP if sleep apnea contributes to hypertension.  Moderate obstructive sleep apnea Moderate obstructive sleep apnea with 16 events per hour. Previous mild apnea with 5.5 events per hour. No CPAP due to weight loss and mild diagnosis. - Refer to [facility] on [facility] for in-lab sleep study to confirm diagnosis and assess need for CPAP.         Lucie Buttner, PA-C

## 2024-04-21 ENCOUNTER — Encounter: Payer: Self-pay | Admitting: Physician Assistant

## 2024-05-06 ENCOUNTER — Encounter: Payer: Self-pay | Admitting: *Deleted

## 2024-05-06 NOTE — Telephone Encounter (Signed)
 Reached out to patient again today but got no answer. No contact letter has been sent to her home address.

## 2024-05-08 ENCOUNTER — Other Ambulatory Visit: Payer: Self-pay | Admitting: Physician Assistant

## 2024-05-10 ENCOUNTER — Other Ambulatory Visit: Payer: Self-pay

## 2024-05-10 ENCOUNTER — Other Ambulatory Visit (HOSPITAL_COMMUNITY): Payer: Self-pay

## 2024-05-10 MED ORDER — CITALOPRAM HYDROBROMIDE 20 MG PO TABS
20.0000 mg | ORAL_TABLET | Freq: Every day | ORAL | 0 refills | Status: DC
  Filled 2024-05-10: qty 30, 30d supply, fill #0
  Filled 2024-06-13: qty 30, 30d supply, fill #1
  Filled 2024-07-18: qty 30, 30d supply, fill #2

## 2024-05-13 ENCOUNTER — Other Ambulatory Visit: Payer: Self-pay | Admitting: Medical Genetics

## 2024-05-13 DIAGNOSIS — Z006 Encounter for examination for normal comparison and control in clinical research program: Secondary | ICD-10-CM

## 2024-05-26 ENCOUNTER — Other Ambulatory Visit (HOSPITAL_COMMUNITY): Payer: Self-pay

## 2024-06-14 ENCOUNTER — Other Ambulatory Visit (HOSPITAL_COMMUNITY): Payer: Self-pay

## 2024-07-18 ENCOUNTER — Other Ambulatory Visit (HOSPITAL_COMMUNITY): Payer: Self-pay

## 2024-07-20 ENCOUNTER — Other Ambulatory Visit: Payer: Self-pay

## 2024-08-04 ENCOUNTER — Other Ambulatory Visit: Payer: Self-pay | Admitting: Physician Assistant

## 2024-08-04 ENCOUNTER — Encounter: Payer: Self-pay | Admitting: Physician Assistant

## 2024-08-04 MED ORDER — CITALOPRAM HYDROBROMIDE 40 MG PO TABS: 40.0000 mg | ORAL_TABLET | Freq: Every day | ORAL | 0 refills | Status: AC

## 2024-08-23 ENCOUNTER — Other Ambulatory Visit: Payer: Self-pay

## 2024-11-09 ENCOUNTER — Encounter: Payer: PRIVATE HEALTH INSURANCE | Admitting: Physician Assistant
